# Patient Record
Sex: Female | Born: 1990 | Race: Black or African American | Hispanic: No | Marital: Single | Smoking: Former smoker
Health system: Southern US, Community
[De-identification: ages and names within clinical notes are randomized; demographics above are authoritative.]

## PROBLEM LIST (undated history)

## (undated) ENCOUNTER — Inpatient Hospital Stay (HOSPITAL_COMMUNITY): Payer: Self-pay

## (undated) DIAGNOSIS — S82409A Unspecified fracture of shaft of unspecified fibula, initial encounter for closed fracture: Secondary | ICD-10-CM

## (undated) DIAGNOSIS — O139 Gestational [pregnancy-induced] hypertension without significant proteinuria, unspecified trimester: Secondary | ICD-10-CM

## (undated) DIAGNOSIS — A599 Trichomoniasis, unspecified: Secondary | ICD-10-CM

## (undated) DIAGNOSIS — Z8742 Personal history of other diseases of the female genital tract: Secondary | ICD-10-CM

## (undated) DIAGNOSIS — F909 Attention-deficit hyperactivity disorder, unspecified type: Secondary | ICD-10-CM

## (undated) DIAGNOSIS — G43909 Migraine, unspecified, not intractable, without status migrainosus: Secondary | ICD-10-CM

## (undated) DIAGNOSIS — N390 Urinary tract infection, site not specified: Secondary | ICD-10-CM

## (undated) DIAGNOSIS — R87629 Unspecified abnormal cytological findings in specimens from vagina: Secondary | ICD-10-CM

## (undated) DIAGNOSIS — A539 Syphilis, unspecified: Secondary | ICD-10-CM

## (undated) DIAGNOSIS — K219 Gastro-esophageal reflux disease without esophagitis: Secondary | ICD-10-CM

## (undated) DIAGNOSIS — A749 Chlamydial infection, unspecified: Secondary | ICD-10-CM

## (undated) DIAGNOSIS — F329 Major depressive disorder, single episode, unspecified: Secondary | ICD-10-CM

## (undated) DIAGNOSIS — F32A Depression, unspecified: Secondary | ICD-10-CM

## (undated) DIAGNOSIS — R87619 Unspecified abnormal cytological findings in specimens from cervix uteri: Secondary | ICD-10-CM

## (undated) DIAGNOSIS — IMO0002 Reserved for concepts with insufficient information to code with codable children: Secondary | ICD-10-CM

## (undated) DIAGNOSIS — O24419 Gestational diabetes mellitus in pregnancy, unspecified control: Secondary | ICD-10-CM

## (undated) HISTORY — PX: INDUCED ABORTION: SHX677

## (undated) HISTORY — DX: Gestational (pregnancy-induced) hypertension without significant proteinuria, unspecified trimester: O13.9

## (undated) HISTORY — PX: DILATION AND CURETTAGE OF UTERUS: SHX78

## (undated) HISTORY — PX: WISDOM TOOTH EXTRACTION: SHX21

## (undated) HISTORY — DX: Depression, unspecified: F32.A

## (undated) HISTORY — DX: Gestational diabetes mellitus in pregnancy, unspecified control: O24.419

## (undated) HISTORY — DX: Major depressive disorder, single episode, unspecified: F32.9

---
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
## 2010-01-02 ENCOUNTER — Emergency Department (HOSPITAL_COMMUNITY): Admission: EM | Admit: 2010-01-02 | Discharge: 2010-01-02 | Payer: Self-pay | Admitting: Emergency Medicine

## 2010-02-27 ENCOUNTER — Ambulatory Visit: Payer: Self-pay | Admitting: Nurse Practitioner

## 2010-02-27 ENCOUNTER — Inpatient Hospital Stay (HOSPITAL_COMMUNITY): Admission: AD | Admit: 2010-02-27 | Discharge: 2010-02-27 | Payer: Self-pay | Admitting: Obstetrics

## 2010-06-11 ENCOUNTER — Inpatient Hospital Stay (HOSPITAL_COMMUNITY)
Admission: AD | Admit: 2010-06-11 | Discharge: 2010-06-11 | Disposition: A | Discharge status: Home / Self Care | Payer: Medicaid Other | Source: Ambulatory Visit | Attending: Obstetrics | Admitting: Obstetrics

## 2010-06-11 DIAGNOSIS — A499 Bacterial infection, unspecified: Secondary | ICD-10-CM | POA: Insufficient documentation

## 2010-06-11 DIAGNOSIS — R300 Dysuria: Secondary | ICD-10-CM | POA: Insufficient documentation

## 2010-06-11 DIAGNOSIS — N760 Acute vaginitis: Secondary | ICD-10-CM

## 2010-06-11 DIAGNOSIS — A6000 Herpesviral infection of urogenital system, unspecified: Secondary | ICD-10-CM | POA: Insufficient documentation

## 2010-06-11 DIAGNOSIS — B9689 Other specified bacterial agents as the cause of diseases classified elsewhere: Secondary | ICD-10-CM | POA: Insufficient documentation

## 2010-06-11 LAB — URINALYSIS, ROUTINE W REFLEX MICROSCOPIC
Ketones, ur: NEGATIVE mg/dL
Nitrite: NEGATIVE
Specific Gravity, Urine: >1.03 — ABNORMAL HIGH (ref 1.005–1.030)
pH: 5.5 (ref 5.0–8.0)

## 2010-06-11 LAB — POCT PREGNANCY, URINE: Preg Test, Ur: NEGATIVE

## 2010-06-11 LAB — WET PREP, GENITAL: Yeast Wet Prep HPF POC: NONE SEEN

## 2010-06-12 LAB — GC/CHLAMYDIA PROBE AMP, GENITAL: GC Probe Amp, Genital: NEGATIVE

## 2010-06-14 LAB — HERPES SIMPLEX VIRUS CULTURE

## 2010-07-10 LAB — URINALYSIS, ROUTINE W REFLEX MICROSCOPIC
Bilirubin Urine: NEGATIVE
Glucose, UA: NEGATIVE mg/dL
Ketones, ur: NEGATIVE mg/dL
Leukocytes, UA: NEGATIVE
Protein, ur: NEGATIVE mg/dL

## 2010-07-10 LAB — WET PREP, GENITAL
Trich, Wet Prep: NONE SEEN
Yeast Wet Prep HPF POC: NONE SEEN

## 2010-07-10 LAB — GC/CHLAMYDIA PROBE AMP, GENITAL: GC Probe Amp, Genital: NEGATIVE

## 2010-07-13 LAB — URINALYSIS, ROUTINE W REFLEX MICROSCOPIC
Glucose, UA: NEGATIVE mg/dL
Hgb urine dipstick: NEGATIVE
Ketones, ur: NEGATIVE mg/dL
Protein, ur: NEGATIVE mg/dL

## 2010-07-13 LAB — WET PREP, GENITAL
Clue Cells Wet Prep HPF POC: NONE SEEN
Trich, Wet Prep: NONE SEEN
Yeast Wet Prep HPF POC: NONE SEEN

## 2010-07-15 LAB — URINALYSIS, ROUTINE W REFLEX MICROSCOPIC
Hgb urine dipstick: NEGATIVE
Ketones, ur: NEGATIVE mg/dL
Protein, ur: NEGATIVE mg/dL
Urobilinogen, UA: 0.2 mg/dL (ref 0.0–1.0)

## 2010-07-15 LAB — WET PREP, GENITAL: Trich, Wet Prep: NONE SEEN

## 2010-07-15 LAB — POCT PREGNANCY, URINE: Preg Test, Ur: NEGATIVE

## 2010-09-07 ENCOUNTER — Inpatient Hospital Stay (HOSPITAL_COMMUNITY)
Admission: AD | Admit: 2010-09-07 | Discharge: 2010-09-07 | Disposition: A | Discharge status: Home / Self Care | Payer: Medicaid Other | Source: Ambulatory Visit | Attending: Obstetrics | Admitting: Obstetrics

## 2010-09-07 DIAGNOSIS — N949 Unspecified condition associated with female genital organs and menstrual cycle: Secondary | ICD-10-CM

## 2010-09-07 DIAGNOSIS — B3731 Acute candidiasis of vulva and vagina: Secondary | ICD-10-CM | POA: Insufficient documentation

## 2010-09-07 DIAGNOSIS — B373 Candidiasis of vulva and vagina: Secondary | ICD-10-CM | POA: Insufficient documentation

## 2010-09-07 DIAGNOSIS — A5901 Trichomonal vulvovaginitis: Secondary | ICD-10-CM

## 2010-09-07 LAB — WET PREP, GENITAL: Yeast Wet Prep HPF POC: NONE SEEN

## 2010-09-10 LAB — GC/CHLAMYDIA PROBE AMP, GENITAL: GC Probe Amp, Genital: NEGATIVE

## 2010-09-18 ENCOUNTER — Inpatient Hospital Stay (HOSPITAL_COMMUNITY)
Admission: AD | Admit: 2010-09-18 | Discharge: 2010-09-18 | Disposition: A | Discharge status: Home / Self Care | Payer: Medicaid Other | Source: Ambulatory Visit | Attending: Obstetrics | Admitting: Obstetrics

## 2010-09-18 DIAGNOSIS — A5901 Trichomonal vulvovaginitis: Secondary | ICD-10-CM | POA: Insufficient documentation

## 2010-09-18 DIAGNOSIS — N949 Unspecified condition associated with female genital organs and menstrual cycle: Secondary | ICD-10-CM

## 2010-09-18 LAB — WET PREP, GENITAL: Clue Cells Wet Prep HPF POC: NONE SEEN

## 2010-09-20 LAB — GC/CHLAMYDIA PROBE AMP, GENITAL
Chlamydia, DNA Probe: NEGATIVE
GC Probe Amp, Genital: NEGATIVE

## 2010-10-12 ENCOUNTER — Inpatient Hospital Stay (HOSPITAL_COMMUNITY)
Admission: AD | Admit: 2010-10-12 | Discharge: 2010-10-12 | Discharge status: Against Medical Advice | Payer: Medicaid Other | Source: Ambulatory Visit | Attending: Obstetrics & Gynecology | Admitting: Obstetrics & Gynecology

## 2010-10-12 DIAGNOSIS — R109 Unspecified abdominal pain: Secondary | ICD-10-CM | POA: Insufficient documentation

## 2010-10-12 LAB — URINALYSIS, ROUTINE W REFLEX MICROSCOPIC
Bilirubin Urine: NEGATIVE
Glucose, UA: NEGATIVE mg/dL
Hgb urine dipstick: NEGATIVE
Specific Gravity, Urine: >1.03 — ABNORMAL HIGH (ref 1.005–1.030)
pH: 6 (ref 5.0–8.0)

## 2010-10-12 LAB — POCT PREGNANCY, URINE: Preg Test, Ur: NEGATIVE

## 2010-11-24 ENCOUNTER — Inpatient Hospital Stay (HOSPITAL_COMMUNITY)
Admission: AD | Admit: 2010-11-24 | Discharge: 2010-11-24 | Discharge status: Against Medical Advice | Payer: Medicaid Other | Source: Ambulatory Visit | Attending: Obstetrics & Gynecology | Admitting: Obstetrics & Gynecology

## 2010-11-24 ENCOUNTER — Encounter (HOSPITAL_COMMUNITY): Payer: Self-pay | Admitting: *Deleted

## 2010-11-24 DIAGNOSIS — Z3200 Encounter for pregnancy test, result unknown: Secondary | ICD-10-CM | POA: Insufficient documentation

## 2010-11-24 LAB — URINALYSIS, ROUTINE W REFLEX MICROSCOPIC
Glucose, UA: NEGATIVE mg/dL
Hgb urine dipstick: NEGATIVE
Specific Gravity, Urine: 1.025 (ref 1.005–1.030)
Urobilinogen, UA: 0.2 mg/dL (ref 0.0–1.0)
pH: 7 (ref 5.0–8.0)

## 2010-11-24 LAB — POCT PREGNANCY, URINE: Preg Test, Ur: NEGATIVE

## 2010-11-24 NOTE — Progress Notes (Signed)
Pt reports having lower abd pain on and off for the past month. Pt reports having nausea but no vomiting. Pt reports missing 2 periods

## 2010-11-24 NOTE — ED Notes (Signed)
Pt wanted to sign out AMA. Just wanted pregnancy test and once found out results didn't want to wait. AMA form signed and pt left at [facility]

## 2010-12-21 ENCOUNTER — Emergency Department (HOSPITAL_COMMUNITY)
Admission: EM | Admit: 2010-12-21 | Discharge: 2010-12-21 | Disposition: A | Discharge status: Home / Self Care | Payer: Medicaid Other | Attending: Emergency Medicine | Admitting: Emergency Medicine

## 2010-12-21 DIAGNOSIS — J45909 Unspecified asthma, uncomplicated: Secondary | ICD-10-CM | POA: Insufficient documentation

## 2010-12-21 DIAGNOSIS — N949 Unspecified condition associated with female genital organs and menstrual cycle: Secondary | ICD-10-CM | POA: Insufficient documentation

## 2010-12-21 DIAGNOSIS — B9689 Other specified bacterial agents as the cause of diseases classified elsewhere: Secondary | ICD-10-CM | POA: Insufficient documentation

## 2010-12-21 DIAGNOSIS — N72 Inflammatory disease of cervix uteri: Secondary | ICD-10-CM | POA: Insufficient documentation

## 2010-12-21 DIAGNOSIS — N760 Acute vaginitis: Secondary | ICD-10-CM | POA: Insufficient documentation

## 2010-12-21 DIAGNOSIS — A499 Bacterial infection, unspecified: Secondary | ICD-10-CM | POA: Insufficient documentation

## 2010-12-21 LAB — WET PREP, GENITAL: Trich, Wet Prep: NONE SEEN

## 2010-12-21 LAB — URINALYSIS, ROUTINE W REFLEX MICROSCOPIC
Bilirubin Urine: NEGATIVE
Nitrite: NEGATIVE
Specific Gravity, Urine: 1.022 (ref 1.005–1.030)
Urobilinogen, UA: 1 mg/dL (ref 0.0–1.0)

## 2010-12-21 LAB — URINE MICROSCOPIC-ADD ON

## 2010-12-22 LAB — GC/CHLAMYDIA PROBE AMP, GENITAL
Chlamydia, DNA Probe: NEGATIVE
GC Probe Amp, Genital: NEGATIVE

## 2010-12-24 LAB — POCT PREGNANCY, URINE: Preg Test, Ur: NEGATIVE

## 2011-01-25 LAB — URINALYSIS, ROUTINE W REFLEX MICROSCOPIC
Bilirubin Urine: NEGATIVE
Ketones, ur: NEGATIVE
Nitrite: NEGATIVE
Specific Gravity, Urine: >1.03 — ABNORMAL HIGH
Urobilinogen, UA: 0.2

## 2011-01-25 LAB — POCT PREGNANCY, URINE: Operator id: 27769

## 2011-02-05 DIAGNOSIS — Z0271 Encounter for disability determination: Secondary | ICD-10-CM

## 2011-03-04 ENCOUNTER — Inpatient Hospital Stay (HOSPITAL_COMMUNITY)
Admission: AD | Admit: 2011-03-04 | Discharge: 2011-03-04 | Discharge status: Against Medical Advice | Payer: Medicaid Other | Source: Ambulatory Visit | Attending: Obstetrics & Gynecology | Admitting: Obstetrics & Gynecology

## 2011-03-04 ENCOUNTER — Encounter (HOSPITAL_COMMUNITY): Payer: Self-pay

## 2011-03-04 DIAGNOSIS — R109 Unspecified abdominal pain: Secondary | ICD-10-CM | POA: Insufficient documentation

## 2011-03-04 DIAGNOSIS — Z3202 Encounter for pregnancy test, result negative: Secondary | ICD-10-CM | POA: Insufficient documentation

## 2011-03-04 LAB — URINALYSIS, ROUTINE W REFLEX MICROSCOPIC
Leukocytes, UA: NEGATIVE
Nitrite: NEGATIVE
Specific Gravity, Urine: 1.02 (ref 1.005–1.030)
pH: 7 (ref 5.0–8.0)

## 2011-03-04 NOTE — ED Notes (Signed)
After nurse finished initial gyn assessment, Pt asked if pregnancy test had come back yet. Informed pt that the test was negative. The patient then asked if she could leave. Informed pt that if she left at this point she would need to sign an AMA form. Explained to patient what that meant & she stated she still wanted to leave. Had patient sign elopement form on esignature pad & patient left.

## 2011-03-04 NOTE — Progress Notes (Signed)
Past couple days has been getting sick. Period is late, has not done test.  Has bad odor.  Back started hurting today.

## 2011-03-23 ENCOUNTER — Inpatient Hospital Stay (HOSPITAL_COMMUNITY)
Admission: AD | Admit: 2011-03-23 | Discharge: 2011-03-23 | Disposition: A | Discharge status: Home / Self Care | Payer: Medicaid Other | Source: Ambulatory Visit | Attending: Obstetrics | Admitting: Obstetrics

## 2011-03-23 ENCOUNTER — Encounter (HOSPITAL_COMMUNITY): Payer: Self-pay | Admitting: Obstetrics and Gynecology

## 2011-03-23 ENCOUNTER — Inpatient Hospital Stay (HOSPITAL_COMMUNITY): Payer: Medicaid Other

## 2011-03-23 DIAGNOSIS — R109 Unspecified abdominal pain: Secondary | ICD-10-CM | POA: Insufficient documentation

## 2011-03-23 DIAGNOSIS — O209 Hemorrhage in early pregnancy, unspecified: Secondary | ICD-10-CM | POA: Insufficient documentation

## 2011-03-23 LAB — URINALYSIS, ROUTINE W REFLEX MICROSCOPIC
Leukocytes, UA: NEGATIVE
Protein, ur: NEGATIVE mg/dL
Urobilinogen, UA: 0.2 mg/dL (ref 0.0–1.0)

## 2011-03-23 LAB — CBC
HCT: 40.9 % (ref 36.0–46.0)
MCHC: 33.7 g/dL (ref 30.0–36.0)
RDW: 13.2 % (ref 11.5–15.5)

## 2011-03-23 LAB — HCG, QUANTITATIVE, PREGNANCY: hCG, Beta Chain, Quant, S: 4509 m[IU]/mL — ABNORMAL HIGH (ref <5)

## 2011-03-23 LAB — WET PREP, GENITAL: Yeast Wet Prep HPF POC: NONE SEEN

## 2011-03-23 LAB — POCT PREGNANCY, URINE: Preg Test, Ur: POSITIVE

## 2011-03-23 NOTE — Progress Notes (Signed)
Pt presents to [facility] with complaints of stomach pain, vagina odor, and vaginal spotting that stopped last night. Pt asked me if I can give her an Ultrasound because she is worried about the baby. Pt is G3P0 at approx 8 weeks preg.

## 2011-03-23 NOTE — Progress Notes (Signed)
Onset of lower abdominal pain with spotting yesterday, none today, having odor, found out pregnant on Monday in Dr. Verdell Carmine office, had period in September.

## 2011-03-25 LAB — GC/CHLAMYDIA PROBE AMP, GENITAL: GC Probe Amp, Genital: NEGATIVE

## 2011-04-02 LAB — OB RESULTS CONSOLE ABO/RH: RH Type: POSITIVE

## 2011-04-02 LAB — OB RESULTS CONSOLE HIV ANTIBODY (ROUTINE TESTING): HIV: NONREACTIVE

## 2011-04-02 LAB — OB RESULTS CONSOLE HEPATITIS B SURFACE ANTIGEN: Hepatitis B Surface Ag: NEGATIVE

## 2011-04-19 ENCOUNTER — Encounter (HOSPITAL_COMMUNITY): Payer: Self-pay | Admitting: *Deleted

## 2011-04-19 ENCOUNTER — Inpatient Hospital Stay (HOSPITAL_COMMUNITY)
Admission: AD | Admit: 2011-04-19 | Discharge: 2011-04-19 | Disposition: A | Discharge status: Home / Self Care | Payer: Medicaid Other | Source: Ambulatory Visit | Attending: Obstetrics | Admitting: Obstetrics

## 2011-04-19 DIAGNOSIS — R059 Cough, unspecified: Secondary | ICD-10-CM | POA: Insufficient documentation

## 2011-04-19 DIAGNOSIS — R05 Cough: Secondary | ICD-10-CM | POA: Insufficient documentation

## 2011-04-19 DIAGNOSIS — J069 Acute upper respiratory infection, unspecified: Secondary | ICD-10-CM

## 2011-04-19 DIAGNOSIS — J3489 Other specified disorders of nose and nasal sinuses: Secondary | ICD-10-CM | POA: Insufficient documentation

## 2011-04-19 DIAGNOSIS — O99891 Other specified diseases and conditions complicating pregnancy: Secondary | ICD-10-CM | POA: Insufficient documentation

## 2011-04-19 MED ORDER — GUAIFENESIN 100 MG/5ML PO LIQD: 200.0000 mg | Freq: Three times a day (TID) | ORAL | Status: AC | PRN

## 2011-04-19 NOTE — Progress Notes (Signed)
Onset of congestion for 1 week with dry cough, chest feels heavy, denies chills or fever, feels bad, [redacted] weeks pregnant, does not know what she can take.

## 2011-04-19 NOTE — ED Provider Notes (Signed)
History     Chief Complaint  Patient presents with  . Nasal Congestion  . Cough   Cough The current episode started in the past 7 days. The problem has been unchanged. The cough is non-productive. Associated symptoms include ear pain, a fever (low grade fever.), headaches, nasal congestion and rhinorrhea. Pertinent negatives include no myalgias. She has tried nothing for the symptoms.    OB History    Grav Para Term Preterm Abortions TAB SAB Ect Mult Living   3 0 0 0 2 1 1 0 0 0       Past Medical History  Diagnosis Date  . Asthma     childhood /grew out of it    Past Surgical History  Procedure Date  . Wisdom tooth extraction     Family History  Problem Relation Age of Onset  . Diabetes Mother   . Hypertension Mother   . Diabetes Maternal Grandfather   . Anesthesia problems Neg Hx   . Hypotension Neg Hx   . Malignant hyperthermia Neg Hx   . Pseudochol deficiency Neg Hx     History  Substance Use Topics  . Smoking status: Former Smoker -- 0.0 packs/day  . Smokeless tobacco: Not on file  . Alcohol Use: No    Allergies: No Known Allergies  Prescriptions prior to admission  Medication Sig Dispense Refill  . acetaminophen (TYLENOL) 325 MG tablet Take 650 mg by mouth every 6 (six) hours as needed. For headache or pain       . Prenatal Vit-Fe Fumarate-FA (PRENATAL MULTIVITAMIN) TABS Take 1 tablet by mouth daily.          Review of Systems  Constitutional: Positive for fever (low grade fever.).  HENT: Positive for ear pain, congestion (nasal congestion) and rhinorrhea. Negative for neck pain.   Respiratory: Positive for cough.   Gastrointestinal: Positive for nausea. Negative for vomiting.  Genitourinary: Negative.   Musculoskeletal: Negative for myalgias.  Neurological: Positive for headaches. Negative for dizziness.   Physical Exam   Blood pressure 124/69, pulse 115, temperature 100.1 F (37.8 C), resp. rate 18, height 5' (1.524 m), weight 110.678 kg (244  lb), last menstrual period 01/21/2011.  Physical Exam  Constitutional: She is oriented to [place], place, and time. She appears well-developed and well-nourished. No distress.  HENT:  Head: Normocephalic and atraumatic.  Right Ear: External ear normal.  Left Ear: External ear normal.  Mouth/Throat: Oropharynx is clear and moist.  Eyes: Pupils are equal, round, and reactive to light.  Neck: Normal range of motion.  Cardiovascular: Normal rate and regular rhythm.   Respiratory: Effort normal and breath sounds normal. No respiratory distress.  Musculoskeletal: Normal range of motion.  Neurological: She is alert and oriented to [place], place, and time. She has normal reflexes.  Skin: Skin is warm and dry.  Psychiatric: She has a normal mood and affect. Her behavior is normal. Judgment and thought content normal.    MAU Course  Procedures  Assessment and Plan  Discussed pregnancy safe OTC meds for cough and cold in pregnancy. NOB visit scheduled on Monday. Advised tylenol cold, robitussin, vicks vaporub. Call office answering service for further problems.   HAMBY, REBECCA 04/19/2011, 7:31 PM

## 2011-04-19 NOTE — Progress Notes (Signed)
Becky Hamby CNM in to see pt. Discussed d/c plan and meds pt could take for cold sx. Understanding voiced. Has scheduled appt for Monday in office.

## 2011-04-19 NOTE — ED Provider Notes (Signed)
History     Chief Complaint  Patient presents with  . Nasal Congestion  . Cough   HPI Georgie C Tippin is 20 y.o. G3P0020 [redacted]w[redacted]d weeks presenting with complaint of bilateral ear pain, left more than right.  She has sinus, eye and head pain that began last night.  She did not call the office today to report sxs.  Was unaware of fever.  Vomiting X 1 today.  Has dry cough.  Has begun prenatal care.    Past Medical History  Diagnosis Date  . Asthma     childhood /grew out of it    Past Surgical History  Procedure Date  . Wisdom tooth extraction     Family History  Problem Relation Age of Onset  . Diabetes Mother   . Hypertension Mother   . Diabetes Maternal Grandfather   . Anesthesia problems Neg Hx   . Hypotension Neg Hx   . Malignant hyperthermia Neg Hx   . Pseudochol deficiency Neg Hx     History  Substance Use Topics  . Smoking status: Former Smoker -- 0.0 packs/day  . Smokeless tobacco: Not on file  . Alcohol Use: No    Allergies: No Known Allergies  Prescriptions prior to admission  Medication Sig Dispense Refill  . acetaminophen (TYLENOL) 325 MG tablet Take 650 mg by mouth every 6 (six) hours as needed. For headache or pain       . Prenatal Vit-Fe Fumarate-FA (PRENATAL MULTIVITAMIN) TABS Take 1 tablet by mouth daily.          Review of Systems  Constitutional: Positive for fever. Negative for chills.  HENT: Positive for ear pain. Negative for sore throat.   Eyes: Positive for pain.  Respiratory: Positive for cough (dry).   Gastrointestinal: Positive for vomiting (x1). Negative for nausea.  Genitourinary: Negative.   Neurological: Positive for headaches.   Physical Exam   Blood pressure 124/69, pulse 115, temperature 100.1 F (37.8 C), resp. rate 18, height 5' (1.524 m), weight 244 lb (110.678 kg), last menstrual period 01/21/2011.  Physical Exam  Constitutional: She is oriented to [place], place, and time. She appears well-developed. She has a sickly  appearance.  HENT:  Head: Normocephalic.  Right Ear: External ear normal.  Left Ear: External ear normal.  Mouth/Throat: Oropharynx is clear and moist.       Tenderness over sinuses.  Cardiovascular: Normal rate.   Respiratory: Effort normal.  Lymphadenopathy:    She has no cervical adenopathy.  Neurological: She is alert and oriented to [place], place, and time.  Skin: Skin is warm.    MAU Course  Procedures  MDM 19:20  After MSE, started to call Dr. Clearance Coots and noticed CNM on call, called Anice Paganini, CNM, reported sxs and physical findings.   She will come in to see patient.   Assessment and Plan   KEY,EVE M 04/19/2011, 7:07 PM   Matt Holmes, NP 04/19/11 [phone]

## 2011-04-19 NOTE — ED Notes (Signed)
Eve Key NP in to see pt

## 2011-04-19 NOTE — ED Notes (Signed)
Eve Key RNP in wi th pt

## 2011-04-30 NOTE — L&D Delivery Note (Signed)
Delivery Note At 7:30 PM a viable female was delivered via Vaginal, Spontaneous Delivery (Presentation: ; Right Occiput Anterior). The posterior arm was delivered after an attempt was made to deliver the anterior shoulder.   APGAR: 6, 9 .   Placenta status: Intact, Spontaneous with cord traction.  Cord: 3 vessels with the following complications: None.    Anesthesia: Epidural  Episiotomy: None Lacerations: 2nd degree;Vaginal Suture Repair: 2.0 3.0 vicryl rapide Est. Blood Loss (mL): 300 ml  Mom to postpartum.  Baby to nursery-stable.  JACKSON-MOORE,Hadlie Gipson A 11/27/2011, 7:59 PM

## 2011-05-29 ENCOUNTER — Encounter (HOSPITAL_COMMUNITY): Payer: Self-pay | Admitting: Neurology

## 2011-05-29 ENCOUNTER — Emergency Department (HOSPITAL_COMMUNITY)
Admission: EM | Admit: 2011-05-29 | Discharge: 2011-05-29 | Disposition: A | Discharge status: Home / Self Care | Payer: Medicaid Other | Attending: Emergency Medicine | Admitting: Emergency Medicine

## 2011-05-29 DIAGNOSIS — H1010 Acute atopic conjunctivitis, unspecified eye: Secondary | ICD-10-CM

## 2011-05-29 DIAGNOSIS — T1590XA Foreign body on external eye, part unspecified, unspecified eye, initial encounter: Secondary | ICD-10-CM | POA: Insufficient documentation

## 2011-05-29 DIAGNOSIS — H5710 Ocular pain, unspecified eye: Secondary | ICD-10-CM | POA: Insufficient documentation

## 2011-05-29 DIAGNOSIS — H5789 Other specified disorders of eye and adnexa: Secondary | ICD-10-CM | POA: Insufficient documentation

## 2011-05-29 DIAGNOSIS — H1045 Other chronic allergic conjunctivitis: Secondary | ICD-10-CM | POA: Insufficient documentation

## 2011-05-29 DIAGNOSIS — J3489 Other specified disorders of nose and nasal sinuses: Secondary | ICD-10-CM | POA: Insufficient documentation

## 2011-05-29 DIAGNOSIS — H11419 Vascular abnormalities of conjunctiva, unspecified eye: Secondary | ICD-10-CM | POA: Insufficient documentation

## 2011-05-29 DIAGNOSIS — J45909 Unspecified asthma, uncomplicated: Secondary | ICD-10-CM | POA: Insufficient documentation

## 2011-05-29 DIAGNOSIS — R6889 Other general symptoms and signs: Secondary | ICD-10-CM | POA: Insufficient documentation

## 2011-05-29 DIAGNOSIS — O9989 Other specified diseases and conditions complicating pregnancy, childbirth and the puerperium: Secondary | ICD-10-CM | POA: Insufficient documentation

## 2011-05-29 DIAGNOSIS — H579 Unspecified disorder of eye and adnexa: Secondary | ICD-10-CM | POA: Insufficient documentation

## 2011-05-29 MED ORDER — FLUORESCEIN SODIUM 1 MG OP STRP
1.0000 | ORAL_STRIP | Freq: Once | OPHTHALMIC | Status: AC
  Administered 2011-05-29: 09:00:00 via OPHTHALMIC

## 2011-05-29 MED ORDER — TETRACAINE HCL 0.5 % OP SOLN
OPHTHALMIC | Status: AC
  Filled 2011-05-29: qty 2

## 2011-05-29 MED ORDER — FLUORESCEIN SODIUM 1 MG OP STRP
ORAL_STRIP | OPHTHALMIC | Status: AC
  Filled 2011-05-29: qty 1

## 2011-05-29 MED ORDER — LORATADINE 10 MG PO TABS: 10.0000 mg | ORAL_TABLET | Freq: Every day | ORAL | Status: DC

## 2011-05-29 MED ORDER — TETRACAINE HCL 0.5 % OP SOLN
1.0000 [drp] | Freq: Once | OPHTHALMIC | Status: AC
  Administered 2011-05-29: 09:00:00 via OPHTHALMIC

## 2011-05-29 NOTE — ED Notes (Signed)
Pt reporting pregnant; c/o right eye swelling. Denying any drainage. Reporting scratch to right eye last night. Pupils equal and reactive, 3+. Alert and oriented. NAD

## 2011-05-29 NOTE — ED Provider Notes (Signed)
History     CSN: [id number]  Arrival date & time 05/29/11  [id number]   First MD Initiated Contact with Patient 05/29/11 [phone]      Chief Complaint  Patient presents with  . Eye Pain    (Consider location/radiation/quality/duration/timing/severity/associated sxs/prior treatment) HPI Comments: Patient states she noted some eye itching yesterday. Scratched her last evening and awoke today with some right periorbital swelling. She has no pain, discharge, or visual changes. States her vision is normal. No headache. She has associated congestion.  Patient is a 21 y.o. female presenting with eye pain. The history is provided by the patient. No language interpreter was used.  Eye Pain This is a new problem. The current episode started yesterday. The problem occurs constantly. The problem has been gradually worsening. Pertinent negatives include no chest pain, no abdominal pain, no headaches and no shortness of breath. The symptoms are aggravated by nothing. The symptoms are relieved by nothing. She has tried nothing for the symptoms.    Past Medical History  Diagnosis Date  . Asthma     childhood /grew out of it    Past Surgical History  Procedure Date  . Wisdom tooth extraction     Family History  Problem Relation Age of Onset  . Diabetes Mother   . Hypertension Mother   . Diabetes Maternal Grandfather   . Anesthesia problems Neg Hx   . Hypotension Neg Hx   . Malignant hyperthermia Neg Hx   . Pseudochol deficiency Neg Hx     History  Substance Use Topics  . Smoking status: Former Smoker -- 0.0 packs/day  . Smokeless tobacco: Not on file  . Alcohol Use: No    OB History    Grav Para Term Preterm Abortions TAB SAB Ect Mult Living   3 0 0 0 2 1 1 0 0 0       Review of Systems  Constitutional: Negative for fever, activity change, appetite change and fatigue.  HENT: Positive for congestion and sneezing. Negative for sore throat, rhinorrhea, neck pain and neck stiffness.     Eyes: Positive for pain.  Respiratory: Negative for cough and shortness of breath.   Cardiovascular: Negative for chest pain and palpitations.  Gastrointestinal: Negative for nausea, vomiting and abdominal pain.  Genitourinary: Negative for dysuria, urgency, frequency and flank pain.  Neurological: Negative for dizziness, weakness, light-headedness, numbness and headaches.  All other systems reviewed and are negative.    Allergies  Fruit & vegetable  Home Medications   Current Outpatient Rx  Name Route Sig Dispense Refill  . ACETAMINOPHEN 325 MG PO TABS Oral Take 650 mg by mouth every 6 (six) hours as needed. For headache or pain     . PRENATAL MULTIVITAMIN CH Oral Take 1 tablet by mouth daily.      . TETRAHYDROZ-POLYVINYL AL-POVID 0.05-0.5-0.6 % OP SOLN Right Eye Place 1-2 drops into the right eye once. Swollen eye    . LORATADINE 10 MG PO TABS Oral Take 1 tablet (10 mg total) by mouth daily. 14 tablet 0    BP 129/83  Pulse 105  Temp(Src) 98 F (36.7 C) (Oral)  Resp 18  SpO2 98%  LMP 01/21/2011  Physical Exam  Nursing note and vitals reviewed. Constitutional: She is oriented to [place], place, and time. She appears well-developed and well-nourished. No distress.  HENT:  Head: Normocephalic and atraumatic.  Mouth/Throat: Oropharynx is clear and moist. No oropharyngeal exudate.  Eyes: Conjunctivae and EOM are normal. Pupils are equal, round,  and reactive to light.       Mild periorbital swelling to r eye with some scleral injection.  No fluoroscein uptake  Neck: Normal range of motion. Neck supple.  Cardiovascular: Normal rate, regular rhythm, normal heart sounds and intact distal pulses.  Exam reveals no gallop and no friction rub.   No murmur heard. Pulmonary/Chest: Effort normal and breath sounds normal. No respiratory distress.  Abdominal: Soft. There is no tenderness.  Musculoskeletal: Normal range of motion. She exhibits no tenderness.  Neurological: She is alert  and oriented to [place], place, and time. No cranial nerve deficit.    ED Course  Procedures (including critical care time)  Labs Reviewed - No data to display No results found.   1. Allergic conjunctivitis   2. Foreign body, eye       MDM  An eyelash was removed from the patient's right eye using a cotton swab. There is no evidence of corneal abrasion. Patient is 3 months pregnant. She'll be discharged home on Claritin which is safe in pregnancy. This will treat her allergic conjunctivitis. There is no evidence of periorbital cellulitis.  Mild tachycardia on arrival likely secondary to her pregnant state.  She has no abd pain or vaginal sx        Dayton Bailiff, MD 05/29/11 [phone]

## 2011-05-29 NOTE — ED Notes (Signed)
Pt reports last night scratched eye with finger, been having eye pain and swelling since last night. Denying any drainage or visual changes  Pt alert and oriented. NAD

## 2011-05-29 NOTE — ED Notes (Signed)
EDP in to evaluate patient

## 2011-06-17 ENCOUNTER — Inpatient Hospital Stay (HOSPITAL_COMMUNITY)
Admission: AD | Admit: 2011-06-17 | Discharge: 2011-06-17 | Disposition: A | Discharge status: Home / Self Care | Payer: Medicaid Other | Source: Ambulatory Visit | Attending: Obstetrics & Gynecology | Admitting: Obstetrics & Gynecology

## 2011-06-17 ENCOUNTER — Encounter (HOSPITAL_COMMUNITY): Payer: Self-pay | Admitting: *Deleted

## 2011-06-17 DIAGNOSIS — O2390 Unspecified genitourinary tract infection in pregnancy, unspecified trimester: Secondary | ICD-10-CM | POA: Insufficient documentation

## 2011-06-17 DIAGNOSIS — B9689 Other specified bacterial agents as the cause of diseases classified elsewhere: Secondary | ICD-10-CM | POA: Insufficient documentation

## 2011-06-17 DIAGNOSIS — N760 Acute vaginitis: Secondary | ICD-10-CM | POA: Insufficient documentation

## 2011-06-17 DIAGNOSIS — A499 Bacterial infection, unspecified: Secondary | ICD-10-CM | POA: Insufficient documentation

## 2011-06-17 DIAGNOSIS — R109 Unspecified abdominal pain: Secondary | ICD-10-CM | POA: Insufficient documentation

## 2011-06-17 HISTORY — DX: Attention-deficit hyperactivity disorder, unspecified type: F90.9

## 2011-06-17 LAB — URINALYSIS, ROUTINE W REFLEX MICROSCOPIC
Bilirubin Urine: NEGATIVE
Nitrite: NEGATIVE
Protein, ur: NEGATIVE mg/dL
Specific Gravity, Urine: >1.03 — ABNORMAL HIGH (ref 1.005–1.030)
Urobilinogen, UA: 0.2 mg/dL (ref 0.0–1.0)

## 2011-06-17 LAB — WET PREP, GENITAL: Trich, Wet Prep: NONE SEEN

## 2011-06-17 MED ORDER — METRONIDAZOLE 500 MG PO TABS: 500.0000 mg | ORAL_TABLET | Freq: Two times a day (BID) | ORAL | Status: AC

## 2011-06-17 NOTE — Progress Notes (Signed)
Pt presents with complaint of "real bad abd pain" x 1 hour. Pain is sharp and at the bottom of her stomach. Denies dysuria. Denies vaginal discharge. G2 P0.

## 2011-06-17 NOTE — ED Provider Notes (Signed)
Teresa [name] y.o.G3P0020 @[redacted]w[redacted]d  Chief Complaint  Patient presents with  . Abdominal Pain    SUBJECTIVE  HPI: Pt reports intermittent sharp abdominal pain 4-5 times in last hour.  She reports feeling some fetal movement, denies LOF, vaginal bleeding, dysuria, vaginal discharge/itching, h/a, dizziness, or n/v.  Past Medical History  Diagnosis Date  . Asthma     childhood /grew out of it  . ADHD (attention deficit hyperactivity disorder)    Past Surgical History  Procedure Date  . Wisdom tooth extraction    History   Social History  . Marital Status: Single    Spouse Name: N/A    Number of Children: N/A  . Years of Education: N/A   Occupational History  . Not on file.   Social History Main Topics  . Smoking status: Former Smoker -- 0.0 packs/day  . Smokeless tobacco: Not on file  . Alcohol Use: No  . Drug Use: No  . Sexually Active: Yes    Birth Control/ Protection: None   Other Topics Concern  . Not on file   Social History Narrative  . No narrative on file   No current facility-administered medications on file prior to encounter.   Current Outpatient Prescriptions on File Prior to Encounter  Medication Sig Dispense Refill  . acetaminophen (TYLENOL) 325 MG tablet Take 650 mg by mouth every 6 (six) hours as needed. For headache or pain       . loratadine (CLARITIN) 10 MG tablet Take 1 tablet (10 mg total) by mouth daily.  14 tablet  0  . Prenatal Vit-Fe Fumarate-FA (PRENATAL MULTIVITAMIN) TABS Take 1 tablet by mouth daily.        . Tetrahydroz-Polyvinyl Al-Povid (MURINE TEARS PLUS) 0.05-0.5-0.6 % SOLN Place 1-2 drops into the right eye once. Swollen eye       Allergies  Allergen Reactions  . Fruit & Vegetable     Grapes, strawberries, bananas, pine apple     ROS: Pertinent items in HPI  OBJECTIVE Blood pressure 123/63, pulse 96, temperature 98.4 F (36.9 C), temperature source Oral, resp. rate 18, height 5\' 1"  (1.549 m), weight 110.678 kg (244 lb),  last menstrual period 01/21/2011. GENERAL: Well-developed, well-nourished female in no acute distress.  LUNGS: Clear to auscultation bilaterally.  HEART: Regular rate and rhythm. ABDOMEN: Soft, nontender EXTREMITIES: Nontender, no edema EXTERNAL GENITALIA: normal VAGINA: physiologic discharge CERVIX: long/closed/high  FHR: 155 by doppler   RESULTS Results for orders placed during the hospital encounter of 06/17/11 (from the past 24 hour(s))  URINALYSIS, ROUTINE W REFLEX MICROSCOPIC     Status: Abnormal   Collection Time   06/17/11  9:45 PM      Component Value Range   Color, Urine YELLOW  YELLOW    APPearance CLEAR  CLEAR    Specific Gravity, Urine >1.030 (*) 1.005 - 1.030    pH 6.0  5.0 - 8.0    Glucose, UA NEGATIVE  NEGATIVE (mg/dL)   Hgb urine dipstick NEGATIVE  NEGATIVE    Bilirubin Urine NEGATIVE  NEGATIVE    Ketones, ur NEGATIVE  NEGATIVE (mg/dL)   Protein, ur NEGATIVE  NEGATIVE (mg/dL)   Urobilinogen, UA 0.2  0.0 - 1.0 (mg/dL)   Nitrite NEGATIVE  NEGATIVE    Leukocytes, UA NEGATIVE  NEGATIVE   WET PREP, GENITAL     Status: Abnormal   Collection Time   06/17/11 10:36 PM      Component Value Range   Yeast Wet Prep HPF POC NONE  SEEN  NONE SEEN    Trich, Wet Prep NONE SEEN  NONE SEEN    Clue Cells Wet Prep HPF POC FEW (*) NONE SEEN    WBC, Wet Prep HPF POC FEW (*) NONE SEEN     IMAGING Not indicated  ASSESSMENT Bacterial vaginosis Reactive NST  PLAN D/C home Flagyl 500 mg BID for 7 days Encourage increase in PO fluids F/U with Dr Gaynell Face for prenatal care

## 2011-06-17 NOTE — Discharge Instructions (Signed)
Bacterial Vaginosis Bacterial vaginosis (BV) is a vaginal infection where the normal balance of bacteria in the vagina is disrupted. The normal balance is then replaced by an overgrowth of certain bacteria. There are several different kinds of bacteria that can cause BV. BV is the most common vaginal infection in women of childbearing age. CAUSES   The cause of BV is not fully understood. BV develops when there is an increase or imbalance of harmful bacteria.   Some activities or behaviors can upset the normal balance of bacteria in the vagina and put women at increased risk including:   Having a new sex partner or multiple sex partners.   Douching.   Using an intrauterine device (IUD) for contraception.   It is not clear what role sexual activity plays in the development of BV. However, women that have never had sexual intercourse are rarely infected with BV.  Women do not get BV from toilet seats, bedding, swimming pools or from touching objects around them.  SYMPTOMS   Grey vaginal discharge.   A fish-like odor with discharge, especially after sexual intercourse.   Itching or burning of the vagina and vulva.   Burning or pain with urination.   Some women have no signs or symptoms at all.  DIAGNOSIS  Your caregiver must examine the vagina for signs of BV. Your caregiver will perform lab tests and look at the sample of vaginal fluid through a microscope. They will look for bacteria and abnormal cells (clue cells), a pH test higher than 4.5, and a positive amine test all associated with BV.  RISKS AND COMPLICATIONS   Pelvic inflammatory disease (PID).   Infections following gynecology surgery.   Developing HIV.   Developing herpes virus.  TREATMENT  Sometimes BV will clear up without treatment. However, all women with symptoms of BV should be treated to avoid complications, especially if gynecology surgery is planned. Female partners generally do not need to be treated. However,  BV may spread between female sex partners so treatment is helpful in preventing a recurrence of BV.   BV may be treated with antibiotics. The antibiotics come in either pill or vaginal cream forms. Either can be used with nonpregnant or pregnant women, but the recommended dosages differ. These antibiotics are not harmful to the baby.   BV can recur after treatment. If this happens, a second round of antibiotics will often be prescribed.   Treatment is important for pregnant women. If not treated, BV can cause a premature delivery, especially for a pregnant woman who had a premature birth in the past. All pregnant women who have symptoms of BV should be checked and treated.   For chronic reoccurrence of BV, treatment with a type of prescribed gel vaginally twice a week is helpful.  HOME CARE INSTRUCTIONS   Finish all medication as directed by your caregiver.   Do not have sex until treatment is completed.   Tell your sexual partner that you have a vaginal infection. They should see their caregiver and be treated if they have problems, such as a mild rash or itching.   Practice safe sex. Use condoms. Only have 1 sex partner.  PREVENTION  Basic prevention steps can help reduce the risk of upsetting the natural balance of bacteria in the vagina and developing BV:  Do not have sexual intercourse (be abstinent).   Do not douche.   Use all of the medicine prescribed for treatment of BV, even if the signs and symptoms go away.     Tell your sex partner if you have BV. That way, they can be treated, if needed, to prevent reoccurrence.  SEEK MEDICAL CARE IF:   Your symptoms are not improving after 3 days of treatment.   You have increased discharge, pain, or fever.  MAKE SURE YOU:   Understand these instructions.   Will watch your condition.   Will get help right away if you are not doing well or get worse.  FOR MORE INFORMATION  Division of STD Prevention (DSTDP), Centers for Disease  Control and Prevention: www.cdc.gov/std American Social Health Association (ASHA): www.ashastd.org  Document Released: 04/15/2005 Document Revised: 12/26/2010 Document Reviewed: 10/06/2008 Exit[employer] Patient Information 2012 [employer]. 

## 2011-06-18 LAB — GC/CHLAMYDIA PROBE AMP, GENITAL: Chlamydia, DNA Probe: NEGATIVE

## 2011-08-09 ENCOUNTER — Other Ambulatory Visit: Payer: Self-pay | Admitting: Obstetrics

## 2011-08-09 DIAGNOSIS — Z1389 Encounter for screening for other disorder: Secondary | ICD-10-CM

## 2011-08-10 ENCOUNTER — Inpatient Hospital Stay (HOSPITAL_COMMUNITY)
Admission: AD | Admit: 2011-08-10 | Discharge: 2011-08-10 | Disposition: A | Discharge status: Home / Self Care | Payer: Self-pay | Source: Ambulatory Visit | Attending: Obstetrics & Gynecology | Admitting: Obstetrics & Gynecology

## 2011-08-10 ENCOUNTER — Encounter (HOSPITAL_COMMUNITY): Payer: Self-pay | Admitting: *Deleted

## 2011-08-10 DIAGNOSIS — O368190 Decreased fetal movements, unspecified trimester, not applicable or unspecified: Secondary | ICD-10-CM | POA: Insufficient documentation

## 2011-08-10 HISTORY — DX: Urinary tract infection, site not specified: N39.0

## 2011-08-10 NOTE — MAU Note (Signed)
No movement since left doctor yesterday.  No pain or other problems

## 2011-08-10 NOTE — MAU Note (Signed)
+  FM noted by RN when first got heart tones.

## 2011-08-10 NOTE — MAU Provider Note (Signed)
  History     CSN: [id number]  Arrival date and time: 08/10/11 1603   None     No chief complaint on file.  HPI 21 y.o. G3P0020 at [redacted]w[redacted]d. Reports decreased fetal movement, hasn't felt movement since yesterday after noon. Now feeling movement. No pain or bleeding.    Past Medical History  Diagnosis Date  . Asthma     childhood /grew out of it  . ADHD (attention deficit hyperactivity disorder)     Past Surgical History  Procedure Date  . Wisdom tooth extraction     Family History  Problem Relation Age of Onset  . Diabetes Mother   . Hypertension Mother   . Diabetes Maternal Grandfather   . Anesthesia problems Neg Hx   . Hypotension Neg Hx   . Malignant hyperthermia Neg Hx   . Pseudochol deficiency Neg Hx     History  Substance Use Topics  . Smoking status: Former Smoker -- 0.0 packs/day  . Smokeless tobacco: Not on file  . Alcohol Use: No    Allergies:  Allergies  Allergen Reactions  . Fruit & Vegetable Itching and Swelling    Grapes, strawberries, bananas, pine apple     Prescriptions prior to admission  Medication Sig Dispense Refill  . acetaminophen (TYLENOL) 325 MG tablet Take 650 mg by mouth every 6 (six) hours as needed. For headache or pain       . loratadine (CLARITIN) 10 MG tablet Take 1 tablet (10 mg total) by mouth daily.  14 tablet  0  . Prenatal Vit-Fe Fumarate-FA (PRENATAL MULTIVITAMIN) TABS Take 1 tablet by mouth daily.          Review of Systems  Constitutional: Negative.   Respiratory: Negative.   Cardiovascular: Negative.   Gastrointestinal: Negative for nausea, vomiting, abdominal pain, diarrhea and constipation.  Genitourinary: Negative for dysuria, urgency, frequency, hematuria and flank pain.       Negative for vaginal bleeding, vaginal discharge, contractions   Musculoskeletal: Negative.   Neurological: Negative.   Psychiatric/Behavioral: Negative.    Physical Exam   Last menstrual period 01/21/2011.  Physical Exam    Nursing note and vitals reviewed. Constitutional: She is oriented to [place], place, and time. She appears well-developed and well-nourished. No distress.  Cardiovascular: Normal rate.   Respiratory: Effort normal.  GI: Soft. There is no tenderness.  Musculoskeletal: Normal range of motion.  Neurological: She is alert and oriented to [place], place, and time.  Skin: Skin is warm and dry.  Psychiatric: She has a normal mood and affect.   EFM: 145, moderate variability, + accels, reassuring at 25 weeks TOCO: quiet  MAU Course  Procedures    Assessment and Plan  21 y.o. G3P0020 at [redacted]w[redacted]d Decreased fetal movement - baby moving now with reassuring FHR tracing Rev'd precautions, fetal kick counts F/U as scheduled Leeanna Slaby 08/10/2011, 4:05 PM

## 2011-08-13 ENCOUNTER — Ambulatory Visit (HOSPITAL_COMMUNITY)
Admission: RE | Admit: 2011-08-13 | Discharge: 2011-08-13 | Disposition: A | Discharge status: Home / Self Care | Payer: Self-pay | Source: Ambulatory Visit | Attending: Obstetrics | Admitting: Obstetrics

## 2011-08-13 DIAGNOSIS — Z3689 Encounter for other specified antenatal screening: Secondary | ICD-10-CM | POA: Insufficient documentation

## 2011-08-13 DIAGNOSIS — O99210 Obesity complicating pregnancy, unspecified trimester: Secondary | ICD-10-CM | POA: Insufficient documentation

## 2011-08-13 DIAGNOSIS — E669 Obesity, unspecified: Secondary | ICD-10-CM | POA: Insufficient documentation

## 2011-08-13 DIAGNOSIS — Z1389 Encounter for screening for other disorder: Secondary | ICD-10-CM

## 2011-08-22 ENCOUNTER — Encounter (HOSPITAL_COMMUNITY): Payer: Self-pay | Admitting: *Deleted

## 2011-08-22 ENCOUNTER — Inpatient Hospital Stay (HOSPITAL_COMMUNITY)
Admission: AD | Admit: 2011-08-22 | Discharge: 2011-08-22 | Disposition: A | Discharge status: Home / Self Care | Payer: Self-pay | Source: Ambulatory Visit | Attending: Obstetrics & Gynecology | Admitting: Obstetrics & Gynecology

## 2011-08-22 DIAGNOSIS — O99891 Other specified diseases and conditions complicating pregnancy: Secondary | ICD-10-CM | POA: Insufficient documentation

## 2011-08-22 DIAGNOSIS — R300 Dysuria: Secondary | ICD-10-CM | POA: Insufficient documentation

## 2011-08-22 DIAGNOSIS — R51 Headache: Secondary | ICD-10-CM | POA: Insufficient documentation

## 2011-08-22 LAB — URINALYSIS, ROUTINE W REFLEX MICROSCOPIC
Bilirubin Urine: NEGATIVE
Leukocytes, UA: NEGATIVE
Nitrite: NEGATIVE
Specific Gravity, Urine: 1.02 (ref 1.005–1.030)
Urobilinogen, UA: 0.2 mg/dL (ref 0.0–1.0)
pH: 6 (ref 5.0–8.0)

## 2011-08-22 MED ORDER — OXYCODONE-ACETAMINOPHEN 5-325 MG PO TABS
2.0000 | ORAL_TABLET | Freq: Once | ORAL | Status: AC
  Administered 2011-08-22: 2 via ORAL
  Filled 2011-08-22: qty 2

## 2011-08-22 MED ORDER — BUTALBITAL-APAP-CAFFEINE 50-325-40 MG PO TABS: 1.0000 | ORAL_TABLET | Freq: Four times a day (QID) | ORAL | Status: DC | PRN

## 2011-08-22 MED ORDER — TRAMADOL HCL 50 MG PO TABS: 50.0000 mg | ORAL_TABLET | Freq: Four times a day (QID) | ORAL | Status: DC | PRN

## 2011-08-22 NOTE — MAU Note (Signed)
Pt states her 'head hurts and when she goes to the bathroom the void she has a milky discharge

## 2011-08-22 NOTE — Discharge Instructions (Signed)

## 2011-08-22 NOTE — MAU Note (Signed)
HA for 2 days, tylenol not working.  States eyes are cloudy.  C/O pain with urination x 1 week.  Denies bleeding, LOF, or uc's.

## 2011-08-22 NOTE — MAU Provider Note (Signed)
  History     CSN: [id number]  Arrival date and time: 08/22/11 1411   First Provider Initiated Contact with Patient 08/22/11 1612      Chief Complaint  Patient presents with  . Headache   HPI This is a 21 y.o. female at [redacted]w[redacted]d presents with c/o headache unrelieved by Tylenol. Denies history of Migraines, headaches started when she got pregnant.   ALso c/o pain with urination. States it feels like a UTI.   Sees [facility] for [facility] but states was told to come here because her Medicaid ran out.   OB History    Grav Para Term Preterm Abortions TAB SAB Ect Mult Living   3 0 0 0 2 1 1 0 0 0       Past Medical History  Diagnosis Date  . Asthma     childhood /grew out of it  . ADHD (attention deficit hyperactivity disorder)   . Urinary tract infection     Past Surgical History  Procedure Date  . Wisdom tooth extraction   . Induced abortion     Family History  Problem Relation Age of Onset  . Diabetes Mother   . Hypertension Mother   . Diabetes Maternal Grandfather   . Anesthesia problems Neg Hx   . Hypotension Neg Hx   . Malignant hyperthermia Neg Hx   . Pseudochol deficiency Neg Hx     History  Substance Use Topics  . Smoking status: Former Smoker -- 0.0 packs/day  . Smokeless tobacco: Not on file  . Alcohol Use: No    Allergies:  Allergies  Allergen Reactions  . Fruit & Vegetable Shortness Of Breath, Itching and Swelling    Grapes, strawberries, bananas, pine apple, oranges    Prescriptions prior to admission  Medication Sig Dispense Refill  . acetaminophen (TYLENOL) 325 MG tablet Take 650 mg by mouth every 6 (six) hours as needed. For headache or pain      . loratadine (CLARITIN) 10 MG tablet Take 1 tablet (10 mg total) by mouth daily.  14 tablet  0  . Prenatal Vit-Fe Fumarate-FA (PRENATAL MULTIVITAMIN) TABS Take 1 tablet by mouth daily.          ROS As listed in HPI  Physical Exam   Blood pressure 112/70, pulse 97, temperature 97.7 F (36.5 C),  temperature source Oral, resp. rate 20, height 5\' 1"  (1.549 m), weight 261 lb 9.6 oz (118.661 kg), last menstrual period 01/21/2011.  Physical Exam  Constitutional: She is oriented to [place], place, and time. She appears well-developed and well-nourished. No distress.  HENT:  Head: Normocephalic.  Cardiovascular: Normal rate.   Respiratory: Effort normal.  GI: Soft. There is no tenderness.  Musculoskeletal: Normal range of motion.  Neurological: She is alert and oriented to [place], place, and time.       No focal neurologic signs  Skin: Skin is warm and dry.  Psychiatric: She has a normal mood and affect.    MAU Course  Procedures  MDM Given Percocet with some relief. Wants to go home  Assessment and Plan  A:  SIUP at [redacted]w[redacted]d      Headache      Normotensive P:  Discharge home      Rx Fioricet      Follow up at office  [facility] 08/22/2011, 4:20 PM

## 2011-09-20 ENCOUNTER — Inpatient Hospital Stay (HOSPITAL_COMMUNITY)
Admission: AD | Admit: 2011-09-20 | Discharge: 2011-09-20 | Disposition: A | Discharge status: Home / Self Care | Payer: Self-pay | Source: Ambulatory Visit | Attending: Obstetrics & Gynecology | Admitting: Obstetrics & Gynecology

## 2011-09-20 ENCOUNTER — Encounter (HOSPITAL_COMMUNITY): Payer: Self-pay | Admitting: *Deleted

## 2011-09-20 DIAGNOSIS — R109 Unspecified abdominal pain: Secondary | ICD-10-CM | POA: Insufficient documentation

## 2011-09-20 DIAGNOSIS — O99891 Other specified diseases and conditions complicating pregnancy: Secondary | ICD-10-CM | POA: Insufficient documentation

## 2011-09-20 DIAGNOSIS — O479 False labor, unspecified: Secondary | ICD-10-CM

## 2011-09-20 LAB — URINALYSIS, ROUTINE W REFLEX MICROSCOPIC
Hgb urine dipstick: NEGATIVE
Leukocytes, UA: NEGATIVE
Nitrite: NEGATIVE
Protein, ur: NEGATIVE mg/dL
Specific Gravity, Urine: 1.015 (ref 1.005–1.030)
Urobilinogen, UA: 0.2 mg/dL (ref 0.0–1.0)

## 2011-09-20 NOTE — MAU Note (Signed)
Pt states, " I was cooking dinner and washing dishes and I had a big cramp in my abdomen and in my back. I lasted ten- fifteen min and came about three minutes apart. It went away on its on and now I'm not having any pain.

## 2011-09-20 NOTE — Discharge Instructions (Signed)

## 2011-09-20 NOTE — MAU Provider Note (Signed)
  History     CSN: [id number]  Arrival date and time: 09/20/11 1638   First Provider Initiated Contact with Patient 09/20/11 1717      Chief Complaint  Patient presents with  . Abdominal Cramping  . Back Pain   HPI  Pt arrived via EMS reporting contractions that started at 1500.  Denies vaginal bleeding or leaking of fluid.  No reports of recent intercourse.  Pt states contractions have stopped since arrival to hospital.    Past Medical History  Diagnosis Date  . Asthma     childhood /grew out of it  . ADHD (attention deficit hyperactivity disorder)   . Urinary tract infection     Past Surgical History  Procedure Date  . Wisdom tooth extraction   . Induced abortion     Family History  Problem Relation Age of Onset  . Diabetes Mother   . Hypertension Mother   . Diabetes Maternal Grandfather   . Anesthesia problems Neg Hx   . Hypotension Neg Hx   . Malignant hyperthermia Neg Hx   . Pseudochol deficiency Neg Hx     History  Substance Use Topics  . Smoking status: Former Smoker -- 0.0 packs/day  . Smokeless tobacco: Not on file  . Alcohol Use: No    Allergies:  Allergies  Allergen Reactions  . Nutritional Supplements Shortness Of Breath, Itching and Swelling    Grapes, strawberries, bananas, pine apple, oranges  . Tramadol Nausea And Vomiting    Pt states medication makes her sick    Prescriptions prior to admission  Medication Sig Dispense Refill  . acetaminophen (TYLENOL) 325 MG tablet Take 650 mg by mouth every 6 (six) hours as needed. For headache or pain      . loratadine (CLARITIN) 10 MG tablet Take 1 tablet (10 mg total) by mouth daily.  14 tablet  0  . Prenatal Vit-Fe Fumarate-FA (PRENATAL MULTIVITAMIN) TABS Take 1 tablet by mouth every morning.         Review of Systems  Gastrointestinal: Positive for abdominal pain (contractions).  All other systems reviewed and are negative.   Physical Exam   Blood pressure 122/77, resp. rate 18, last  menstrual period 01/21/2011, SpO2 100.00%.  Physical Exam  Constitutional: She is oriented to [place], place, and time. She appears well-developed and well-nourished. No distress.  HENT:  Head: Normocephalic.  Neck: Normal range of motion. Neck supple.  Cardiovascular: Normal rate, regular rhythm and normal heart sounds.   Respiratory: Effort normal and breath sounds normal.  GI: Soft. There is no tenderness.  Genitourinary: No bleeding around the vagina. Vaginal discharge (mucusy) found.       Cervix - closed  Neurological: She is alert and oriented to [place], place, and time.  Skin: Skin is warm and dry.   FHR 150's, +accels, reactive Toco - none  MAU Course  Procedures  Assessment and Plan  Braxton Hicks - Normal Exam  Plan: DC to home Labor Precautions  [facility] 09/20/2011, 5:19 PM

## 2011-10-15 ENCOUNTER — Other Ambulatory Visit: Payer: Self-pay | Admitting: Obstetrics

## 2011-10-15 DIAGNOSIS — Z3689 Encounter for other specified antenatal screening: Secondary | ICD-10-CM

## 2011-10-21 ENCOUNTER — Ambulatory Visit (HOSPITAL_COMMUNITY)
Admission: RE | Admit: 2011-10-21 | Discharge: 2011-10-21 | Disposition: A | Discharge status: Home / Self Care | Payer: Medicaid Other | Source: Ambulatory Visit | Attending: Obstetrics | Admitting: Obstetrics

## 2011-10-21 ENCOUNTER — Other Ambulatory Visit: Payer: Self-pay | Admitting: Obstetrics

## 2011-10-21 DIAGNOSIS — Z3689 Encounter for other specified antenatal screening: Secondary | ICD-10-CM

## 2011-10-21 DIAGNOSIS — O3660X0 Maternal care for excessive fetal growth, unspecified trimester, not applicable or unspecified: Secondary | ICD-10-CM | POA: Insufficient documentation

## 2011-10-21 DIAGNOSIS — E669 Obesity, unspecified: Secondary | ICD-10-CM | POA: Insufficient documentation

## 2011-10-24 ENCOUNTER — Other Ambulatory Visit: Payer: Self-pay | Admitting: Obstetrics & Gynecology

## 2011-10-24 ENCOUNTER — Ambulatory Visit (HOSPITAL_COMMUNITY)
Admission: RE | Admit: 2011-10-24 | Discharge: 2011-10-24 | Disposition: A | Discharge status: Home / Self Care | Payer: Medicaid Other | Source: Ambulatory Visit | Attending: Obstetrics & Gynecology | Admitting: Obstetrics & Gynecology

## 2011-10-24 DIAGNOSIS — O99810 Abnormal glucose complicating pregnancy: Secondary | ICD-10-CM | POA: Insufficient documentation

## 2011-10-24 DIAGNOSIS — O288 Other abnormal findings on antenatal screening of mother: Secondary | ICD-10-CM

## 2011-10-24 DIAGNOSIS — O368390 Maternal care for abnormalities of the fetal heart rate or rhythm, unspecified trimester, not applicable or unspecified: Secondary | ICD-10-CM | POA: Insufficient documentation

## 2011-10-24 DIAGNOSIS — O358XX0 Maternal care for other (suspected) fetal abnormality and damage, not applicable or unspecified: Secondary | ICD-10-CM | POA: Insufficient documentation

## 2011-10-29 ENCOUNTER — Encounter (HOSPITAL_COMMUNITY): Payer: Self-pay | Admitting: *Deleted

## 2011-10-29 ENCOUNTER — Inpatient Hospital Stay (HOSPITAL_COMMUNITY)
Admission: AD | Admit: 2011-10-29 | Discharge: 2011-10-29 | Disposition: A | Discharge status: Home / Self Care | Payer: Medicaid Other | Source: Ambulatory Visit | Attending: Obstetrics | Admitting: Obstetrics

## 2011-10-29 DIAGNOSIS — N949 Unspecified condition associated with female genital organs and menstrual cycle: Secondary | ICD-10-CM

## 2011-10-29 DIAGNOSIS — O99891 Other specified diseases and conditions complicating pregnancy: Secondary | ICD-10-CM | POA: Insufficient documentation

## 2011-10-29 DIAGNOSIS — O26899 Other specified pregnancy related conditions, unspecified trimester: Secondary | ICD-10-CM

## 2011-10-29 DIAGNOSIS — R109 Unspecified abdominal pain: Secondary | ICD-10-CM | POA: Insufficient documentation

## 2011-10-29 DIAGNOSIS — M549 Dorsalgia, unspecified: Secondary | ICD-10-CM | POA: Insufficient documentation

## 2011-10-29 LAB — URINALYSIS, ROUTINE W REFLEX MICROSCOPIC
Nitrite: NEGATIVE
Protein, ur: NEGATIVE mg/dL
Specific Gravity, Urine: 1.02 (ref 1.005–1.030)
Urobilinogen, UA: 0.2 mg/dL (ref 0.0–1.0)

## 2011-10-29 LAB — URINE MICROSCOPIC-ADD ON

## 2011-10-29 MED ORDER — CYCLOBENZAPRINE HCL 10 MG PO TABS
10.0000 mg | ORAL_TABLET | Freq: Once | ORAL | Status: AC
  Administered 2011-10-29: 10 mg via ORAL
  Filled 2011-10-29: qty 1

## 2011-10-29 NOTE — MAU Provider Note (Signed)
History     CSN: [id number]  Arrival date and time: 10/29/11 1714   First Provider Initiated Contact with Patient 10/29/11 1822      Chief Complaint  Patient presents with  . Abdominal Pain  . Back Pain   HPI  Pt states, " This morning I felt a sharp pain shoot through my vagina and I took a warm bath but it seems like it got worse. I have a lot of abdominal pressure and lower back achy cramping feeling."  Pt denies vaginal bleeding or leaking of fluid.  Sitting with legs closed increases the pain.  Keeping legs open improves the pain.  Pain is continuous in nature and "achy" in nature.     Past Medical History  Diagnosis Date  . Asthma     childhood /grew out of it  . ADHD (attention deficit hyperactivity disorder)   . Urinary tract infection     Past Surgical History  Procedure Date  . Wisdom tooth extraction   . Induced abortion     Family History  Problem Relation Age of Onset  . Diabetes Mother   . Hypertension Mother   . Diabetes Maternal Grandfather   . Anesthesia problems Neg Hx   . Hypotension Neg Hx   . Malignant hyperthermia Neg Hx   . Pseudochol deficiency Neg Hx     History  Substance Use Topics  . Smoking status: Former Smoker -- 0.0 packs/day  . Smokeless tobacco: Not on file  . Alcohol Use: No    Allergies:  Allergies  Allergen Reactions  . Other Anaphylaxis    Grapes, strawberries, bananas, pineapple, oranges  . Tramadol Nausea And Vomiting    Prescriptions prior to admission  Medication Sig Dispense Refill  . calcium carbonate (TUMS - DOSED IN MG ELEMENTAL CALCIUM) 500 MG chewable tablet Chew 1 tablet by mouth daily. For indigestion      . Prenatal Vit-Fe Fumarate-FA (PRENATAL MULTIVITAMIN) TABS Take 1 tablet by mouth every morning.       Marland Kitchen acetaminophen (TYLENOL) 325 MG tablet Take 650 mg by mouth every 6 (six) hours as needed. For headache or pain      . loratadine (CLARITIN) 10 MG tablet Take 1 tablet (10 mg total) by mouth daily.   14 tablet  0    Review of Systems  Genitourinary:       Vaginal pain  Musculoskeletal: Positive for back pain.  All other systems reviewed and are negative.   Physical Exam   Blood pressure 104/60, pulse 126, temperature 99.1 F (37.3 C), temperature source Oral, resp. rate 20, height 5\' 1"  (1.549 m), weight 125.703 kg (277 lb 2 oz), last menstrual period 01/21/2011.  Physical Exam  Constitutional: She is oriented to [place], place, and time. She appears well-developed and well-nourished.  HENT:  Head: Normocephalic.  Neck: Normal range of motion. Neck supple.  Cardiovascular: Normal rate, regular rhythm and normal heart sounds.   Respiratory: Effort normal and breath sounds normal.  GI: Soft. There is no tenderness.  Genitourinary: No bleeding around the vagina. Vaginal discharge (thick, white, no odor) found.       Cervix - closed/thick  Neurological: She is alert and oriented to [place], place, and time.  Skin: Skin is warm and dry.   FHR 130's, +accels, reactive Toco - irregular  MAU Course  Procedures  Flexeril 10 mg given  Assessment and Plan  Back Pain in Pregnancy Round Ligament Pain  Plan: DC to home Labor precautions  [facility] 10/29/2011, 6:23 PM

## 2011-10-29 NOTE — MAU Note (Signed)
Pt states, " This morning I felt a sharp pain shoot through my vagina and I took a warm bath but it seems like it got worse. I have a lot of abdominal pressure and low back achy cramping feeling."

## 2011-11-26 ENCOUNTER — Telehealth (HOSPITAL_COMMUNITY): Payer: Self-pay | Admitting: *Deleted

## 2011-11-26 ENCOUNTER — Inpatient Hospital Stay (HOSPITAL_COMMUNITY)
Admission: AD | Admit: 2011-11-26 | Discharge: 2011-11-26 | Disposition: A | Discharge status: Home / Self Care | Payer: Medicaid Other | Source: Ambulatory Visit | Attending: Obstetrics & Gynecology | Admitting: Obstetrics & Gynecology

## 2011-11-26 ENCOUNTER — Encounter (HOSPITAL_COMMUNITY): Payer: Self-pay | Admitting: *Deleted

## 2011-11-26 ENCOUNTER — Inpatient Hospital Stay (HOSPITAL_COMMUNITY)
Admission: RE | Admit: 2011-11-26 | Discharge: 2011-11-29 | DRG: 775 | Disposition: A | Discharge status: Home / Self Care | Payer: Medicaid Other | Source: Ambulatory Visit | Attending: Obstetrics | Admitting: Obstetrics

## 2011-11-26 DIAGNOSIS — Z22330 Carrier of Group B streptococcus: Secondary | ICD-10-CM

## 2011-11-26 DIAGNOSIS — O99892 Other specified diseases and conditions complicating childbirth: Secondary | ICD-10-CM | POA: Diagnosis present

## 2011-11-26 DIAGNOSIS — O479 False labor, unspecified: Secondary | ICD-10-CM | POA: Insufficient documentation

## 2011-11-26 DIAGNOSIS — O480 Post-term pregnancy: Principal | ICD-10-CM | POA: Diagnosis present

## 2011-11-26 LAB — CBC
HCT: 34.1 % — ABNORMAL LOW (ref 36.0–46.0)
Hemoglobin: 11.3 g/dL — ABNORMAL LOW (ref 12.0–15.0)
MCH: 29 pg (ref 26.0–34.0)
MCHC: 33.1 g/dL (ref 30.0–36.0)
MCV: 87.7 fL (ref 78.0–100.0)

## 2011-11-26 MED ORDER — LACTATED RINGERS IV SOLN: 500.0000 mL | INTRAVENOUS | Status: DC | PRN

## 2011-11-26 MED ORDER — TERBUTALINE SULFATE 1 MG/ML IJ SOLN: 0.2500 mg | Freq: Once | INTRAMUSCULAR | Status: AC | PRN

## 2011-11-26 MED ORDER — ONDANSETRON HCL 4 MG/2ML IJ SOLN: 4.0000 mg | Freq: Four times a day (QID) | INTRAMUSCULAR | Status: DC | PRN

## 2011-11-26 MED ORDER — OXYCODONE-ACETAMINOPHEN 5-325 MG PO TABS
1.0000 | ORAL_TABLET | ORAL | Status: DC | PRN
  Administered 2011-11-26 – 2011-11-27 (×2): 2 via ORAL
  Filled 2011-11-26 (×2): qty 2

## 2011-11-26 MED ORDER — FLEET ENEMA 7-19 GM/118ML RE ENEM: 1.0000 | ENEMA | RECTAL | Status: DC | PRN

## 2011-11-26 MED ORDER — OXYTOCIN BOLUS FROM INFUSION
250.0000 mL | Freq: Once | INTRAVENOUS | Status: DC
  Filled 2011-11-26: qty 500

## 2011-11-26 MED ORDER — ZOLPIDEM TARTRATE 5 MG PO TABS
5.0000 mg | ORAL_TABLET | Freq: Every evening | ORAL | Status: DC | PRN
  Administered 2011-11-26: 5 mg via ORAL
  Filled 2011-11-26: qty 1

## 2011-11-26 MED ORDER — PENICILLIN G POTASSIUM 5000000 UNITS IJ SOLR
5.0000 10*6.[IU] | Freq: Once | INTRAVENOUS | Status: AC
  Administered 2011-11-27: 5 10*6.[IU] via INTRAVENOUS
  Filled 2011-11-26: qty 5

## 2011-11-26 MED ORDER — ACETAMINOPHEN 325 MG PO TABS: 650.0000 mg | ORAL_TABLET | ORAL | Status: DC | PRN

## 2011-11-26 MED ORDER — LACTATED RINGERS IV SOLN
INTRAVENOUS | Status: DC
  Administered 2011-11-27 (×3): via INTRAVENOUS

## 2011-11-26 MED ORDER — CITRIC ACID-SODIUM CITRATE 334-500 MG/5ML PO SOLN
30.0000 mL | ORAL | Status: DC | PRN
  Filled 2011-11-26: qty 15

## 2011-11-26 MED ORDER — LIDOCAINE HCL (PF) 1 % IJ SOLN
30.0000 mL | INTRAMUSCULAR | Status: DC | PRN
  Filled 2011-11-26 (×2): qty 30

## 2011-11-26 MED ORDER — MISOPROSTOL 25 MCG QUARTER TABLET
25.0000 ug | ORAL_TABLET | ORAL | Status: DC | PRN
  Administered 2011-11-26 – 2011-11-27 (×2): 25 ug via VAGINAL
  Filled 2011-11-26: qty 0.25
  Filled 2011-11-26: qty 1
  Filled 2011-11-26: qty 0.25

## 2011-11-26 MED ORDER — OXYTOCIN 40 UNITS IN LACTATED RINGERS INFUSION - SIMPLE MED: 62.5000 mL/h | Freq: Once | INTRAVENOUS | Status: DC

## 2011-11-26 MED ORDER — PENICILLIN G POTASSIUM 5000000 UNITS IJ SOLR
2.5000 10*6.[IU] | INTRAVENOUS | Status: DC
  Administered 2011-11-27 (×3): 2.5 10*6.[IU] via INTRAVENOUS
  Filled 2011-11-26 (×6): qty 2.5

## 2011-11-26 MED ORDER — BUTORPHANOL TARTRATE 1 MG/ML IJ SOLN
1.0000 mg | INTRAMUSCULAR | Status: DC | PRN
  Administered 2011-11-27: 1 mg via INTRAVENOUS
  Filled 2011-11-26: qty 1

## 2011-11-26 MED ORDER — IBUPROFEN 600 MG PO TABS: 600.0000 mg | ORAL_TABLET | Freq: Four times a day (QID) | ORAL | Status: DC | PRN

## 2011-11-26 NOTE — Telephone Encounter (Signed)
Preadmission screen  

## 2011-11-26 NOTE — MAU Note (Signed)
PT ARRIVED VIA EMS-  FOR LABOR EVAL.      DENIES HSV AND MRSA.  WAS 1 CM .  IS AN INDUCTION FOR 730PM TODAY.

## 2011-11-26 NOTE — MAU Note (Signed)
Contractions every 6 minutes since 2 am this morning. Denies vaginal bleeding or leaking of fluid. States is scheduled for induction later today at 730pm.

## 2011-11-27 ENCOUNTER — Encounter (HOSPITAL_COMMUNITY): Payer: Self-pay | Admitting: Anesthesiology

## 2011-11-27 ENCOUNTER — Encounter (HOSPITAL_COMMUNITY): Payer: Self-pay

## 2011-11-27 ENCOUNTER — Inpatient Hospital Stay (HOSPITAL_COMMUNITY): Payer: Medicaid Other | Admitting: Anesthesiology

## 2011-11-27 LAB — ABO/RH: ABO/RH(D): O POS

## 2011-11-27 LAB — TYPE AND SCREEN: ABO/RH(D): O POS

## 2011-11-27 MED ORDER — ONDANSETRON HCL 4 MG/2ML IJ SOLN: 4.0000 mg | INTRAMUSCULAR | Status: DC | PRN

## 2011-11-27 MED ORDER — WITCH HAZEL-GLYCERIN EX PADS: 1.0000 "application " | MEDICATED_PAD | CUTANEOUS | Status: DC | PRN

## 2011-11-27 MED ORDER — FENTANYL 2.5 MCG/ML BUPIVACAINE 1/10 % EPIDURAL INFUSION (WH - ANES)
INTRAMUSCULAR | Status: DC | PRN
  Administered 2011-11-27: 12:00:00
  Administered 2011-11-27: 14 mL/h via EPIDURAL
  Administered 2011-11-27: 15:00:00

## 2011-11-27 MED ORDER — EPHEDRINE 5 MG/ML INJ
10.0000 mg | INTRAVENOUS | Status: DC | PRN
  Filled 2011-11-27: qty 4
  Filled 2011-11-27: qty 2

## 2011-11-27 MED ORDER — LACTATED RINGERS IV SOLN: 500.0000 mL | Freq: Once | INTRAVENOUS | Status: DC

## 2011-11-27 MED ORDER — PROMETHAZINE HCL 25 MG/ML IJ SOLN
25.0000 mg | Freq: Four times a day (QID) | INTRAMUSCULAR | Status: DC | PRN
  Administered 2011-11-27: 25 mg via INTRAMUSCULAR
  Filled 2011-11-27: qty 1

## 2011-11-27 MED ORDER — MEDROXYPROGESTERONE ACETATE 150 MG/ML IM SUSP: 150.0000 mg | INTRAMUSCULAR | Status: DC | PRN

## 2011-11-27 MED ORDER — DIPHENHYDRAMINE HCL 25 MG PO CAPS: 25.0000 mg | ORAL_CAPSULE | Freq: Four times a day (QID) | ORAL | Status: DC | PRN

## 2011-11-27 MED ORDER — OXYCODONE-ACETAMINOPHEN 5-325 MG PO TABS
1.0000 | ORAL_TABLET | ORAL | Status: DC | PRN
  Administered 2011-11-27 – 2011-11-29 (×4): 1 via ORAL
  Administered 2011-11-29: 2 via ORAL
  Administered 2011-11-29: 1 via ORAL
  Filled 2011-11-27 (×4): qty 1
  Filled 2011-11-27 (×2): qty 2

## 2011-11-27 MED ORDER — TETANUS-DIPHTH-ACELL PERTUSSIS 5-2.5-18.5 LF-MCG/0.5 IM SUSP: 0.5000 mL | Freq: Once | INTRAMUSCULAR | Status: DC

## 2011-11-27 MED ORDER — EPHEDRINE 5 MG/ML INJ
10.0000 mg | INTRAVENOUS | Status: DC | PRN
  Filled 2011-11-27: qty 2

## 2011-11-27 MED ORDER — IBUPROFEN 600 MG PO TABS
600.0000 mg | ORAL_TABLET | Freq: Four times a day (QID) | ORAL | Status: DC
  Administered 2011-11-27 – 2011-11-29 (×6): 600 mg via ORAL
  Filled 2011-11-27 (×6): qty 1

## 2011-11-27 MED ORDER — PHENYLEPHRINE 40 MCG/ML (10ML) SYRINGE FOR IV PUSH (FOR BLOOD PRESSURE SUPPORT)
80.0000 ug | PREFILLED_SYRINGE | INTRAVENOUS | Status: DC | PRN
  Filled 2011-11-27: qty 2

## 2011-11-27 MED ORDER — NALBUPHINE SYRINGE 5 MG/0.5 ML
10.0000 mg | INJECTION | INTRAMUSCULAR | Status: DC
  Administered 2011-11-27: 10 mg via INTRAVENOUS
  Filled 2011-11-27 (×10): qty 1

## 2011-11-27 MED ORDER — BENZOCAINE-MENTHOL 20-0.5 % EX AERO
1.0000 "application " | INHALATION_SPRAY | CUTANEOUS | Status: DC | PRN
  Administered 2011-11-29: 1 via TOPICAL
  Filled 2011-11-27 (×2): qty 56

## 2011-11-27 MED ORDER — OXYTOCIN 40 UNITS IN LACTATED RINGERS INFUSION - SIMPLE MED
1.0000 m[IU]/min | INTRAVENOUS | Status: DC
  Administered 2011-11-27: 2 m[IU]/min via INTRAVENOUS
  Filled 2011-11-27: qty 1000

## 2011-11-27 MED ORDER — ONDANSETRON HCL 4 MG PO TABS: 4.0000 mg | ORAL_TABLET | ORAL | Status: DC | PRN

## 2011-11-27 MED ORDER — LIDOCAINE HCL (PF) 1 % IJ SOLN
INTRAMUSCULAR | Status: DC | PRN
  Administered 2011-11-27: 8 mL
  Administered 2011-11-27: 30 mL
  Administered 2011-11-27: 8 mL

## 2011-11-27 MED ORDER — FENTANYL 2.5 MCG/ML BUPIVACAINE 1/10 % EPIDURAL INFUSION (WH - ANES)
14.0000 mL/h | INTRAMUSCULAR | Status: DC
  Filled 2011-11-27 (×3): qty 60

## 2011-11-27 MED ORDER — PRENATAL MULTIVITAMIN CH
1.0000 | ORAL_TABLET | Freq: Every day | ORAL | Status: DC
  Administered 2011-11-28 – 2011-11-29 (×2): 1 via ORAL
  Filled 2011-11-27 (×2): qty 1

## 2011-11-27 MED ORDER — PHENYLEPHRINE 40 MCG/ML (10ML) SYRINGE FOR IV PUSH (FOR BLOOD PRESSURE SUPPORT)
80.0000 ug | PREFILLED_SYRINGE | INTRAVENOUS | Status: DC | PRN
  Filled 2011-11-27: qty 2
  Filled 2011-11-27: qty 5

## 2011-11-27 MED ORDER — FERROUS SULFATE 325 (65 FE) MG PO TABS
325.0000 mg | ORAL_TABLET | Freq: Two times a day (BID) | ORAL | Status: DC
  Administered 2011-11-28 – 2011-11-29 (×3): 325 mg via ORAL
  Filled 2011-11-27 (×3): qty 1

## 2011-11-27 MED ORDER — MEASLES, MUMPS & RUBELLA VAC ~~LOC~~ INJ
0.5000 mL | INJECTION | Freq: Once | SUBCUTANEOUS | Status: DC
Start: 2011-11-28 — End: 2011-11-28

## 2011-11-27 MED ORDER — ZOLPIDEM TARTRATE 5 MG PO TABS: 5.0000 mg | ORAL_TABLET | Freq: Every evening | ORAL | Status: DC | PRN

## 2011-11-27 MED ORDER — LANOLIN HYDROUS EX OINT: TOPICAL_OINTMENT | CUTANEOUS | Status: DC | PRN

## 2011-11-27 MED ORDER — DIBUCAINE 1 % RE OINT: 1.0000 "application " | TOPICAL_OINTMENT | RECTAL | Status: DC | PRN

## 2011-11-27 MED ORDER — SENNOSIDES-DOCUSATE SODIUM 8.6-50 MG PO TABS
2.0000 | ORAL_TABLET | Freq: Every day | ORAL | Status: DC
  Administered 2011-11-27 – 2011-11-28 (×2): 2 via ORAL

## 2011-11-27 MED ORDER — NALBUPHINE SYRINGE 5 MG/0.5 ML
10.0000 mg | INJECTION | Freq: Four times a day (QID) | INTRAMUSCULAR | Status: DC | PRN
  Administered 2011-11-27: 10 mg via INTRAMUSCULAR
  Filled 2011-11-27 (×2): qty 1

## 2011-11-27 MED ORDER — DIPHENHYDRAMINE HCL 50 MG/ML IJ SOLN: 12.5000 mg | INTRAMUSCULAR | Status: DC | PRN

## 2011-11-27 MED ORDER — MAGNESIUM HYDROXIDE 400 MG/5ML PO SUSP: 30.0000 mL | ORAL | Status: DC | PRN

## 2011-11-27 MED ORDER — LACTATED RINGERS IV SOLN: INTRAVENOUS | Status: DC

## 2011-11-27 NOTE — Anesthesia Procedure Notes (Signed)
Epidural Patient location during procedure: OB Start time: 11/27/2011 8:10 AM End time: 11/27/2011 8:18 AM Reason for block: procedure for pain  Staffing Anesthesiologist: Sandrea Hughs Performed by: anesthesiologist   Preanesthetic Checklist Completed: patient identified, site marked, surgical consent, pre-op evaluation, timeout performed, IV checked, risks and benefits discussed and monitors and equipment checked  Epidural Patient position: sitting Prep: site prepped and draped and DuraPrep Patient monitoring: continuous pulse ox and blood pressure Approach: midline Injection technique: LOR air  Needle:  Needle type: Tuohy  Needle gauge: 17 G Needle length: 9 cm Needle insertion depth: 9 cm Catheter type: closed end flexible Catheter size: 19 Gauge Catheter at skin depth: 15 cm Test dose: negative and Other  Assessment Sensory level: T8 Events: blood not aspirated, injection not painful, no injection resistance, negative IV test and no paresthesia

## 2011-11-27 NOTE — Anesthesia Preprocedure Evaluation (Signed)
Anesthesia Evaluation  Patient identified by MRN, date of birth, ID band Patient awake    Reviewed: Allergy & Precautions, H&P , NPO status , Patient's Chart, lab work & pertinent test results  Airway Mallampati: III TM Distance: >3 FB Neck ROM: full    Dental No notable dental hx.    Pulmonary    Pulmonary exam normal       Cardiovascular negative cardio ROS      Neuro/Psych PSYCHIATRIC DISORDERS Depression negative neurological ROS     GI/Hepatic negative GI ROS, Neg liver ROS,   Endo/Other  Morbid obesity  Renal/GU negative Renal ROS     Musculoskeletal negative musculoskeletal ROS (+)   Abdominal (+) + obese,   Peds negative pediatric ROS (+)  Hematology negative hematology ROS (+)   Anesthesia Other Findings   Reproductive/Obstetrics (+) Pregnancy                           Anesthesia Physical Anesthesia Plan  ASA: III  Anesthesia Plan: Epidural   Post-op Pain Management:    Induction:   Airway Management Planned:   Additional Equipment:   Intra-op Plan:   Post-operative Plan:   Informed Consent: I have reviewed the patients History and Physical, chart, labs and discussed the procedure including the risks, benefits and alternatives for the proposed anesthesia with the patient or authorized representative who has indicated his/her understanding and acceptance.     Plan Discussed with:   Anesthesia Plan Comments:         Anesthesia Quick Evaluation

## 2011-11-27 NOTE — H&P (Signed)
Teresa [name] is a 21 y.o. female presenting for IOL. Maternal Medical History:  Reason for admission: 21 yo G3 P0   EDC 11-21-11.  Presents for IOL for postdates.  Fetal activity: Perceived fetal activity is normal.   Last perceived fetal movement was within the past hour.    Prenatal complications: no prenatal complications   OB History    Grav Para Term Preterm Abortions TAB SAB Ect Mult Living   3 0 0 0 2 1 1 0 0 0      Past Medical History  Diagnosis Date  . Asthma     childhood /grew out of it  . ADHD (attention deficit hyperactivity disorder)   . Urinary tract infection   . Depression    Past Surgical History  Procedure Date  . Wisdom tooth extraction   . Induced abortion    Family History: family history includes Diabetes in her maternal grandfather and mother and Hypertension in her mother.  There is no history of Anesthesia problems, and Hypotension, and Malignant hyperthermia, and Pseudochol deficiency, . Social History:  reports that she has quit smoking. She does not have any smokeless tobacco history on file. She reports that she drinks alcohol. She reports that she does not use illicit drugs.   Prenatal Transfer Tool  Maternal Diabetes: No Genetic Screening: Normal Maternal Ultrasounds/Referrals: Normal Fetal Ultrasounds or other Referrals:  None Maternal Substance Abuse:  No Significant Maternal Medications:  None Significant Maternal Lab Results:  Lab values include: Group B Strep positive Other Comments:  None  Review of Systems  All other systems reviewed and are negative.    Dilation: 3.5 Effacement (%): 80 Station: -2 Exam by:: LCarpenter,RN Blood pressure 120/58, pulse 75, temperature 98.3 F (36.8 C), temperature source Oral, resp. rate 20, height 5\' 1"  (1.549 m), weight 129.275 kg (285 lb), last menstrual period 01/21/2011. Maternal Exam:  Uterine Assessment: Contraction strength is moderate.  Contraction frequency is regular.    Abdomen: Patient reports no abdominal tenderness. Fetal presentation: vertex  Introitus: Normal vulva. Normal vagina.    Physical Exam  Nursing note and vitals reviewed. Constitutional: She is oriented to Teresa [name], place, and time. She appears well-developed and well-nourished.  HENT:  Head: Normocephalic and atraumatic.  Eyes: Conjunctivae are normal. Pupils are equal, round, and reactive to light.  Neck: Normal range of motion. Neck supple.  Cardiovascular: Normal rate and regular rhythm.   Respiratory: Effort normal and breath sounds normal.  GI: Soft.  Musculoskeletal: Normal range of motion.  Neurological: She is alert and oriented to Teresa [name], place, and time.  Skin: Skin is warm and dry.    Prenatal labs: ABO, Rh: --/--/O POS, O POS (07/30 1950) Antibody: NEG (07/30 1950) Rubella: Immune (12/04 0000) RPR: NON REACTIVE (07/30 1950)  HBsAg: Negative (12/04 0000)  HIV: Non-reactive (12/04 0000)  GBS: Positive (07/01 0000)   Assessment/Plan: Postdates.  2 stage IOL.   Teresa [name] A 11/27/2011, 8:11 AM

## 2011-11-27 NOTE — Progress Notes (Signed)
Teresa [name] is a 21 y.o. G3P0020 at [redacted]w[redacted]d by LMP admitted for induction of labor due to Post dates. Due date 11-21-11.  Subjective:   Objective: BP 120/58  Pulse 75  Temp 98.3 F (36.8 C) (Oral)  Resp 20  Ht 5\' 1"  (1.549 m)  Wt 129.275 kg (285 lb)  BMI 53.85 kg/m2  LMP 01/21/2011      FHT:  FHR: 130 bpm, variability: moderate,  accelerations:  Present,  decelerations:  Absent UC:   regular, every 3-4 minutes SVE:   Dilation: 3.5 Effacement (%): 80 Station: -2 Exam by:: LCarpenter,RN  Labs: Lab Results  Component Value Date   WBC 7.8 11/26/2011   HGB 11.3* 11/26/2011   HCT 34.1* 11/26/2011   MCV 87.7 11/26/2011   PLT 267 11/26/2011    Assessment / Plan: Induction of labor due to postterm,  progressing well on pitocin  Labor: Progressing on Pitocin, will continue to increase then AROM Preeclampsia:  n/a Fetal Wellbeing:  Category I Pain Control:  Epidural I/D:  n/a Anticipated MOD:  NSVD  Corvin Sorbo A 11/27/2011, 8:16 AM

## 2011-11-28 MED ORDER — PNEUMOCOCCAL VAC POLYVALENT 25 MCG/0.5ML IJ INJ
0.5000 mL | INJECTION | INTRAMUSCULAR | Status: AC
  Administered 2011-11-29: 0.5 mL via INTRAMUSCULAR
  Filled 2011-11-28: qty 0.5

## 2011-11-28 NOTE — Progress Notes (Signed)
UR chart review completed.  

## 2011-11-28 NOTE — Progress Notes (Signed)
Post Partum Day 1 Subjective: no complaints  Objective: Blood pressure 102/68, pulse 80, temperature 98.5 F (36.9 C), temperature source Oral, resp. rate 18, height 5\' 1"  (1.549 m), weight 129.275 kg (285 lb), last menstrual period 01/21/2011, SpO2 98.00%, unknown if currently breastfeeding.  Physical Exam:  General: alert and no distress Lochia: appropriate Uterine Fundus: firm Incision: healing well DVT Evaluation: No evidence of DVT seen on physical exam.   Basename 11/26/11 1950  HGB 11.3*  HCT 34.1*    Assessment/Plan: Plan for discharge tomorrow   LOS: 2 days   Christino Mcglinchey A 11/28/2011, 8:19 AM

## 2011-11-28 NOTE — Anesthesia Postprocedure Evaluation (Signed)
  Anesthesia Post-op Note  Patient: Teresa [name]  Procedure(s) Performed: * No procedures listed *  Patient Location: PACU and Mother/Baby  Anesthesia Type: Epidural  Level of Consciousness: awake, alert  and oriented  Airway and Oxygen Therapy: Patient Spontanous Breathing  Post-op Pain: none  Post-op Assessment: Post-op Vital signs reviewed and Patient's Cardiovascular Status Stable  Post-op Vital Signs: Reviewed and stable  Complications: No apparent anesthesia complications

## 2011-11-29 MED ORDER — LEVONORGEST-ETH ESTRAD 91-DAY 0.15-0.03 &0.01 MG PO TABS: 1.0000 | ORAL_TABLET | Freq: Every day | ORAL | Status: DC

## 2011-11-29 MED ORDER — IBUPROFEN 800 MG PO TABS: 800.0000 mg | ORAL_TABLET | Freq: Three times a day (TID) | ORAL | Status: DC | PRN

## 2011-11-29 MED ORDER — OXYCODONE-ACETAMINOPHEN 5-325 MG PO TABS: 1.0000 | ORAL_TABLET | ORAL | Status: AC | PRN

## 2011-11-29 MED ORDER — IBUPROFEN 600 MG PO TABS: 600.0000 mg | ORAL_TABLET | Freq: Four times a day (QID) | ORAL | Status: DC

## 2011-11-29 MED ORDER — OXYCODONE-ACETAMINOPHEN 5-325 MG PO TABS: 1.0000 | ORAL_TABLET | ORAL | Status: DC | PRN

## 2011-11-29 NOTE — Discharge Summary (Signed)
Obstetric Discharge Summary Reason for Admission: induction of labor Prenatal Procedures: ultrasound Intrapartum Procedures: spontaneous vaginal delivery Postpartum Procedures: none Complications-Operative and Postpartum: none Hemoglobin  Date Value Range Status  11/26/2011 11.3* 12.0 - 15.0 g/dL Final     HCT  Date Value Range Status  11/26/2011 34.1* 36.0 - 46.0 % Final    Physical Exam:  General: alert and no distress Lochia: appropriate Uterine Fundus: firm Incision: healing well DVT Evaluation: No evidence of DVT seen on physical exam.  Discharge Diagnoses: Term Pregnancy-delivered  Discharge Information: Date: 11/29/2011 Activity: pelvic rest Diet: routine Medications: PNV, Ibuprofen, Colace and Percocet Condition: stable Instructions: refer to practice specific booklet Discharge to: home Follow-up Information    Follow up with Teresa Char A, MD. Schedule an appointment as soon as possible for Teresa [name] visit in 6 weeks.   Contact information:   [address], [address] [place] [address] [phone]          Newborn Data: Live born female  Birth Weight: 8 lb 5.7 oz (3790 g) APGAR: 6, 9  Home with mother.  Teresa Teresa [name] Teresa [name] 11/29/2011, 10:57 AM

## 2011-11-29 NOTE — Progress Notes (Signed)
Post Partum Day 2 Subjective: no complaints  Objective: Blood pressure 131/87, pulse 98, temperature 98.1 F (36.7 C), temperature source Oral, resp. rate 18, height 5\' 1"  (1.549 m), weight 129.275 kg (285 lb), last menstrual period 01/21/2011, SpO2 98.00%, unknown if currently breastfeeding.  Physical Exam:  General: alert and no distress Lochia: appropriate Uterine Fundus: firm Incision: healing well DVT Evaluation: No evidence of DVT seen on physical exam.   Basename 11/26/11 1950  HGB 11.3*  HCT 34.1*    Assessment/Plan: Discharge home   LOS: 3 days   HARPER,CHARLES A 11/29/2011, 10:46 AM

## 2011-12-01 ENCOUNTER — Inpatient Hospital Stay (HOSPITAL_COMMUNITY): Admission: RE | Admit: 2011-12-01 | Payer: Medicaid Other | Source: Ambulatory Visit

## 2012-01-10 ENCOUNTER — Encounter (HOSPITAL_COMMUNITY): Payer: Self-pay

## 2012-01-10 ENCOUNTER — Emergency Department (HOSPITAL_COMMUNITY)
Admission: EM | Admit: 2012-01-10 | Discharge: 2012-01-10 | Disposition: A | Discharge status: Home / Self Care | Payer: Medicaid Other | Attending: Emergency Medicine | Admitting: Emergency Medicine

## 2012-01-10 ENCOUNTER — Emergency Department (HOSPITAL_COMMUNITY): Payer: Medicaid Other

## 2012-01-10 DIAGNOSIS — F909 Attention-deficit hyperactivity disorder, unspecified type: Secondary | ICD-10-CM | POA: Insufficient documentation

## 2012-01-10 DIAGNOSIS — F3289 Other specified depressive episodes: Secondary | ICD-10-CM | POA: Insufficient documentation

## 2012-01-10 DIAGNOSIS — J40 Bronchitis, not specified as acute or chronic: Secondary | ICD-10-CM | POA: Insufficient documentation

## 2012-01-10 DIAGNOSIS — Z87891 Personal history of nicotine dependence: Secondary | ICD-10-CM | POA: Insufficient documentation

## 2012-01-10 DIAGNOSIS — F329 Major depressive disorder, single episode, unspecified: Secondary | ICD-10-CM | POA: Insufficient documentation

## 2012-01-10 DIAGNOSIS — G43909 Migraine, unspecified, not intractable, without status migrainosus: Secondary | ICD-10-CM | POA: Insufficient documentation

## 2012-01-10 MED ORDER — IBUPROFEN 400 MG PO TABS
600.0000 mg | ORAL_TABLET | Freq: Once | ORAL | Status: AC
  Administered 2012-01-10: 600 mg via ORAL
  Filled 2012-01-10: qty 1

## 2012-01-10 MED ORDER — METOCLOPRAMIDE HCL 10 MG PO TABS
10.0000 mg | ORAL_TABLET | Freq: Once | ORAL | Status: AC
  Administered 2012-01-10: 10 mg via ORAL
  Filled 2012-01-10: qty 1

## 2012-01-10 MED ORDER — AZITHROMYCIN 250 MG PO TABS: 250.0000 mg | ORAL_TABLET | Freq: Every day | ORAL | Status: AC

## 2012-01-10 MED ORDER — METOCLOPRAMIDE HCL 10 MG PO TABS: 10.0000 mg | ORAL_TABLET | Freq: Four times a day (QID) | ORAL | Status: DC

## 2012-01-10 MED ORDER — IBUPROFEN 600 MG PO TABS: 600.0000 mg | ORAL_TABLET | Freq: Four times a day (QID) | ORAL | Status: AC | PRN

## 2012-01-10 NOTE — ED Provider Notes (Signed)
History     CSN: [id number]  Arrival date & time 01/10/12  1525   First MD Initiated Contact with Patient 01/10/12 1706      Chief Complaint  Patient presents with  . Sore Throat  . Cough  . Respiratory Distress    (Consider location/radiation/quality/duration/timing/severity/associated sxs/prior treatment) HPI Pt has had 3 days of nasal congestion, sore throat, and productive cough with chills and myalgias. She now c/o migraine similar to previous migraine with bitemporal pounding and photophobia. No neck stiffness or pain. No focal weakness or numbness.  Past Medical History  Diagnosis Date  . Asthma     childhood /grew out of it  . ADHD (attention deficit hyperactivity disorder)   . Urinary tract infection   . Depression     Past Surgical History  Procedure Date  . Wisdom tooth extraction   . Induced abortion     Family History  Problem Relation Age of Onset  . Diabetes Mother   . Hypertension Mother   . Diabetes Maternal Grandfather   . Anesthesia problems Neg Hx   . Hypotension Neg Hx   . Malignant hyperthermia Neg Hx   . Pseudochol deficiency Neg Hx     History  Substance Use Topics  . Smoking status: Former Smoker -- 0.0 packs/day  . Smokeless tobacco: Not on file  . Alcohol Use: 0.0 oz/week     denies use currently but was a heavy user    OB History    Grav Para Term Preterm Abortions TAB SAB Ect Mult Living   3 1 1  0 2 1 1  0 0 1      Review of Systems  Constitutional: Positive for chills and fatigue. Negative for fever.  HENT: Positive for congestion and sore throat. Negative for neck pain and neck stiffness.   Eyes: Positive for photophobia.  Respiratory: Positive for cough. Negative for shortness of breath and wheezing.   Cardiovascular: Negative for chest pain, palpitations and leg swelling.  Gastrointestinal: Positive for nausea. Negative for vomiting and abdominal pain.  Musculoskeletal: Positive for myalgias.  Neurological: Positive  for headaches. Negative for dizziness, weakness, light-headedness and numbness.    Allergies  Other and Tramadol  Home Medications   Current Outpatient Rx  Name Route Sig Dispense Refill  . AMOXICILLIN 500 MG PO CAPS Oral Take 500 mg by mouth 3 (three) times daily.    . IBUPROFEN 200 MG PO TABS Oral Take 400 mg by mouth every 6 (six) hours as needed. For pain    . LEVONORGEST-ETH ESTRAD 91-DAY 0.15-0.03 &0.01 MG PO TABS Oral Take 1 tablet by mouth daily. 1 Package 4  . SERTRALINE HCL 50 MG PO TABS Oral Take 50 mg by mouth daily.    . AZITHROMYCIN 250 MG PO TABS Oral Take 1 tablet (250 mg total) by mouth daily. Take first 2 tablets together, then 1 every day until finished. 6 tablet 0  . IBUPROFEN 600 MG PO TABS Oral Take 1 tablet (600 mg total) by mouth every 6 (six) hours as needed for pain. 30 tablet 0  . METOCLOPRAMIDE HCL 10 MG PO TABS Oral Take 1 tablet (10 mg total) by mouth every 6 (six) hours. 30 tablet 0    BP 139/83  Pulse 89  Temp 99.3 F (37.4 C) (Oral)  Resp 16  SpO2 97%  LMP 01/10/2012  Breastfeeding? No  Physical Exam  Nursing note and vitals reviewed. Constitutional: She is oriented to [place], place, and time. She appears well-developed  and well-nourished. No distress.  HENT:  Head: Normocephalic and atraumatic.  Mouth/Throat: Oropharynx is clear and moist.       Mild post pharynx erythema with no exudates  Eyes: EOM are normal. Pupils are equal, round, and reactive to light.  Neck: Normal range of motion. Neck supple.       No meningismus   Cardiovascular: Normal rate and regular rhythm.   Pulmonary/Chest: Effort normal and breath sounds normal. No respiratory distress. She has no wheezes. She has no rales.  Abdominal: Soft. Bowel sounds are normal. She exhibits no distension and no mass. There is no tenderness. There is no rebound and no guarding.  Musculoskeletal: Normal range of motion. She exhibits no edema and no tenderness.  Lymphadenopathy:    She  has no cervical adenopathy.  Neurological: She is alert and oriented to [place], place, and time.       5/5 motor, sensation intact  Skin: Skin is warm and dry. No rash noted. No erythema.  Psychiatric: She has a normal mood and affect. Her behavior is normal.    ED Course  Procedures (including critical care time)  Labs Reviewed - No data to display Dg Chest 2 View  01/10/2012  *RADIOLOGY REPORT*  Clinical Data: Sore throat, cough and respiratory distress.  CHEST - 2 VIEW  Comparison: 01/02/2010.  Findings: The heart size and mediastinal contours are stable. There is mild central airway thickening.  No focal airspace disease is seen on the frontal examination.  On the lateral view, mildly increased density in the retrosternal area is probably due to overlap with the patient's arms.  No consolidation or significant pleural effusion is seen.  The osseous structures appear normal.  IMPRESSION: Central airway thickening suggesting bronchitis.  No evidence of pneumonia.   Original Report Authenticated By: Gerrianne Scale, M.D.      1. Bronchitis   2. Migraine       MDM  Suspect URI/bronchitis. Will R/O pneumonia with CXR        Loren Racer, MD 01/10/12 1821

## 2012-01-10 NOTE — ED Notes (Signed)
Pt complains of sore throat, headache, and cough noted in traige, sts chest feels like it will cave in.

## 2012-02-15 ENCOUNTER — Encounter (HOSPITAL_COMMUNITY): Payer: Self-pay | Admitting: Emergency Medicine

## 2012-02-15 ENCOUNTER — Emergency Department (HOSPITAL_COMMUNITY)
Admission: EM | Admit: 2012-02-15 | Discharge: 2012-02-15 | Discharge status: Against Medical Advice | Payer: Medicaid Other | Attending: Emergency Medicine | Admitting: Emergency Medicine

## 2012-02-15 DIAGNOSIS — R109 Unspecified abdominal pain: Secondary | ICD-10-CM | POA: Insufficient documentation

## 2012-02-15 LAB — URINALYSIS, ROUTINE W REFLEX MICROSCOPIC
Ketones, ur: NEGATIVE mg/dL
Nitrite: NEGATIVE
pH: 7 (ref 5.0–8.0)

## 2012-02-15 LAB — URINE MICROSCOPIC-ADD ON

## 2012-02-15 NOTE — ED Notes (Signed)
Patient seen leaving ED per [employer].

## 2012-02-15 NOTE — ED Notes (Signed)
Pt sts some lower abd cramping x 2 weeks and requesting pregnancy test; pt denies vaginal discharge or UTI sx

## 2012-02-18 ENCOUNTER — Encounter (HOSPITAL_COMMUNITY): Payer: Self-pay

## 2012-02-18 ENCOUNTER — Inpatient Hospital Stay (HOSPITAL_COMMUNITY)
Admission: AD | Admit: 2012-02-18 | Discharge: 2012-02-18 | Disposition: A | Discharge status: Home / Self Care | Payer: Medicaid Other | Source: Ambulatory Visit | Attending: Obstetrics | Admitting: Obstetrics

## 2012-02-18 DIAGNOSIS — N949 Unspecified condition associated with female genital organs and menstrual cycle: Secondary | ICD-10-CM | POA: Insufficient documentation

## 2012-02-18 DIAGNOSIS — R109 Unspecified abdominal pain: Secondary | ICD-10-CM | POA: Insufficient documentation

## 2012-02-18 DIAGNOSIS — A599 Trichomoniasis, unspecified: Secondary | ICD-10-CM

## 2012-02-18 DIAGNOSIS — A5901 Trichomonal vulvovaginitis: Secondary | ICD-10-CM | POA: Insufficient documentation

## 2012-02-18 LAB — WET PREP, GENITAL: Clue Cells Wet Prep HPF POC: NONE SEEN

## 2012-02-18 LAB — HCG, SERUM, QUALITATIVE: Preg, Serum: NEGATIVE

## 2012-02-18 MED ORDER — METRONIDAZOLE 500 MG PO TABS: 500.0000 mg | ORAL_TABLET | Freq: Two times a day (BID) | ORAL | Status: AC

## 2012-02-18 NOTE — MAU Note (Signed)
Was at [facility] on 10/19, had to leave to get daughter- did not get seen; preg test was neg.

## 2012-02-18 NOTE — MAU Provider Note (Signed)
History     CSN: [id number]  Arrival date and time: 02/18/12 1010   First Provider Initiated Contact with Patient 02/18/12 1106      Chief Complaint  Patient presents with  . Abdominal Pain  . Vaginal Discharge  . Possible Pregnancy   HPI Thinks she might be pregnant. Pain in lower abdomen for past couple of weeks, worse last few days. Delivered by SVD 11/27/11 and had been bleeding/spotting until 01/12/12. Has not had a period since then, but resumed sexual intercourse on 9/19. On Seasonique. Negative pregnancy test at home. Feels like she might be pregnant - breasts/nipples itching and leaking. Not breastfeeding.  Her pelvic pain is achy, constant. She has thick white vaginal discharge and thinks she might have a yeast infection. She is also worried about STDs because she is not using condoms.  Has been on amoxicillin for bronchitis.  OB History    Grav Para Term Preterm Abortions TAB SAB Ect Mult Living   3 1 1  0 2 1 1  0 0 1      Past Medical History  Diagnosis Date  . Asthma     childhood /grew out of it  . ADHD (attention deficit hyperactivity disorder)   . Urinary tract infection   . Depression     Past Surgical History  Procedure Date  . Wisdom tooth extraction   . Induced abortion     Family History  Problem Relation Age of Onset  . Diabetes Mother   . Hypertension Mother   . Diabetes Maternal Grandfather   . Anesthesia problems Neg Hx   . Hypotension Neg Hx   . Malignant hyperthermia Neg Hx   . Pseudochol deficiency Neg Hx     History  Substance Use Topics  . Smoking status: Current Every Day Smoker -- 0.0 packs/day    Types: Cigarettes  . Smokeless tobacco: Not on file  . Alcohol Use: 0.0 oz/week     denies use currently but was a heavy user    Allergies:  Allergies  Allergen Reactions  . Other Anaphylaxis    Grapes, strawberries, bananas, pineapple, oranges  . Tramadol Nausea And Vomiting    Prescriptions prior to admission  Medication  Sig Dispense Refill  . Levonorgestrel-Ethinyl Estradiol (SEASONIQUE) 0.15-0.03 &0.01 MG tablet Take 1 tablet by mouth daily.  1 Package  4  . amoxicillin (AMOXIL) 500 MG capsule Take 500 mg by mouth 3 (three) times daily.        Review of Systems  Constitutional: Negative for fever and chills.  Eyes: Negative for blurred vision and double vision.  Respiratory: Negative for shortness of breath.   Cardiovascular: Negative for chest pain.  Gastrointestinal: Negative for nausea, vomiting, diarrhea and constipation.  Genitourinary: Negative for dysuria, urgency and frequency.  Neurological: Negative for dizziness and headaches.   Physical Exam   Blood pressure 130/71, pulse 87, temperature 98.1 F (36.7 C), temperature source Oral, resp. rate 20, height 5' 2.25" (1.581 m), weight 116.574 kg (257 lb), not currently breastfeeding.  Physical Exam  Constitutional: She is oriented to [place], place, and time. She appears well-developed and well-nourished. No distress.  HENT:  Head: Normocephalic and atraumatic.  Eyes: Conjunctivae normal and EOM are normal.  Neck: Normal range of motion. Neck supple.  Cardiovascular: Normal rate, regular rhythm and normal heart sounds.   Respiratory: Effort normal and breath sounds normal. No respiratory distress.  GI: Soft. She exhibits no distension. There is Tenderness: mild supraprubic.. There is no rebound and  no guarding.  Genitourinary:       Normal external genitalia, no lesions. Normal vagina, milky white discharge. Normal, parous cervix. No CMT. Uterus normal size, not fixed or deviated. No adnexal tenderness or masses.  Musculoskeletal: Normal range of motion. She exhibits no edema and no tenderness.  Neurological: She is alert and oriented to [place], place, and time.  Skin: Skin is warm and dry.  Psychiatric: She has a normal mood and affect.   Results for orders placed during the hospital encounter of 02/18/12 (from the past 24 hour(s))  POCT  PREGNANCY, URINE     Status: Normal   Collection Time   02/18/12 11:09 AM      Component Value Range   Preg Test, Ur NEGATIVE  NEGATIVE  WET PREP, GENITAL     Status: Abnormal   Collection Time   02/18/12 11:21 AM      Component Value Range   Yeast Wet Prep HPF POC NONE SEEN  NONE SEEN   Trich, Wet Prep FEW (*) NONE SEEN   Clue Cells Wet Prep HPF POC NONE SEEN  NONE SEEN   WBC, Wet Prep HPF POC MODERATE BACTERIA SEEN (*) NONE SEEN  HCG, SERUM, QUALITATIVE     Status: Normal   Collection Time   02/18/12 11:25 AM      Component Value Range   Preg, Serum NEGATIVE  NEGATIVE   GC/Chlamydia pending  MAU Course  Procedures    Assessment and Plan  21 y.o. [id number] with pelvic pain/cramping, vaginal discomfort - worried about pregnancy and STDs - trichomonas - treat with Flagyl, discussed partner treatment - GC/Chlamydia pending - will treat if positive. Recommend partner get checked as well - Gyn outpatient visit for contraception and other STD testing (HIV, etc.)  Napoleon Form 02/18/2012, 11:08 AM

## 2012-02-18 NOTE — MAU Note (Signed)
Patient is in with c/o abdominal pain. She states that she may be pregnant. She states that she have not had a period this month. (last month she stopped bleeding, vaginal delivery in July and bled till September 15th, 2013). Patient states that she is a seasonique (which she started 2 weeks after delivery). She also thinks she may have a yeast infection as well.

## 2012-02-18 NOTE — MAU Note (Signed)
Past couple wks, stomach has been really hurting bad.  Has done 2 preg tests, they were both neg.  With daughter had 3 neg tests prior to a pos test here.  Has a milky white d/c and irritation.

## 2012-02-19 LAB — GC/CHLAMYDIA PROBE AMP, GENITAL: GC Probe Amp, Genital: NEGATIVE

## 2012-03-10 ENCOUNTER — Inpatient Hospital Stay (HOSPITAL_COMMUNITY)
Admission: AD | Admit: 2012-03-10 | Discharge: 2012-03-10 | Disposition: A | Discharge status: Home / Self Care | Payer: Medicaid Other | Source: Ambulatory Visit | Attending: Obstetrics | Admitting: Obstetrics

## 2012-03-10 ENCOUNTER — Encounter (HOSPITAL_COMMUNITY): Payer: Self-pay | Admitting: *Deleted

## 2012-03-10 DIAGNOSIS — N946 Dysmenorrhea, unspecified: Secondary | ICD-10-CM | POA: Insufficient documentation

## 2012-03-10 DIAGNOSIS — R109 Unspecified abdominal pain: Secondary | ICD-10-CM | POA: Insufficient documentation

## 2012-03-10 HISTORY — DX: Trichomoniasis, unspecified: A59.9

## 2012-03-10 LAB — URINALYSIS, ROUTINE W REFLEX MICROSCOPIC
Bilirubin Urine: NEGATIVE
Hgb urine dipstick: NEGATIVE
Ketones, ur: NEGATIVE mg/dL
Protein, ur: NEGATIVE mg/dL
Urobilinogen, UA: 0.2 mg/dL (ref 0.0–1.0)

## 2012-03-10 LAB — WET PREP, GENITAL
Clue Cells Wet Prep HPF POC: NONE SEEN
Trich, Wet Prep: NONE SEEN
Yeast Wet Prep HPF POC: NONE SEEN

## 2012-03-10 MED ORDER — KETOROLAC TROMETHAMINE 60 MG/2ML IM SOLN
60.0000 mg | Freq: Once | INTRAMUSCULAR | Status: AC
  Administered 2012-03-10: 60 mg via INTRAMUSCULAR
  Filled 2012-03-10: qty 2

## 2012-03-10 MED ORDER — NAPROXEN SODIUM 550 MG PO TABS: 550.0000 mg | ORAL_TABLET | Freq: Two times a day (BID) | ORAL | Status: DC

## 2012-03-10 NOTE — MAU Note (Signed)
Pt states was seen 2 weeks ago for late menstrual cycle, still has not started her cycle. Is now 1 month late. Denies abnormal vaginal discharge or bleeding, but requests "STD check".

## 2012-03-10 NOTE — MAU Provider Note (Signed)
History     CSN: [id number]  Arrival date and time: 03/10/12 1349   First Provider Initiated Contact with Patient 03/10/12 1640      Chief Complaint  Patient presents with  . Dysmenorrhea  . Abdominal Pain   HPI Teresa [name] is a 21 y.o. female who presents to MAU with abdominal cramping. The cramping started yesterday. She is concerned that she may have recurrent STI. She was evaluated here 3 weeks ago and treated for trichomonas. She told her significant other that he needed treatment and he said he was treated. She has been sexually active with him again and not sure if he is seeing someone else. She request STI testing. She has been on OC's but stopped them last week, can't remember to take them. Today scant bleeding with lower abdominal cramping. The history was provided by the patient.  OB History    Grav Para Term Preterm Abortions TAB SAB Ect Mult Living   3 1 1  0 2 1 1  0 0 1      Past Medical History  Diagnosis Date  . Asthma     childhood /grew out of it  . ADHD (attention deficit hyperactivity disorder)   . Urinary tract infection   . Depression   . Trichomonas     Past Surgical History  Procedure Date  . Wisdom tooth extraction   . Induced abortion     Family History  Problem Relation Age of Onset  . Diabetes Mother   . Hypertension Mother   . Diabetes Maternal Grandfather   . Anesthesia problems Neg Hx   . Hypotension Neg Hx   . Malignant hyperthermia Neg Hx   . Pseudochol deficiency Neg Hx     History  Substance Use Topics  . Smoking status: Current Every Day Smoker -- 0.0 packs/day    Types: Cigarettes  . Smokeless tobacco: Never Used  . Alcohol Use: 0.0 oz/week     Comment: denies use currently but was a heavy user    Allergies:  Allergies  Allergen Reactions  . Other Anaphylaxis    Grapes, strawberries, bananas, pineapple, oranges  . Tramadol Nausea And Vomiting    Prescriptions prior to admission  Medication Sig Dispense  Refill  . amoxicillin (AMOXIL) 500 MG capsule Take 500 mg by mouth 3 (three) times daily.      . Levonorgestrel-Ethinyl Estradiol (SEASONIQUE) 0.15-0.03 &0.01 MG tablet Take 1 tablet by mouth daily.  1 Package  4    Review of Systems  Constitutional: Negative for fever and chills.  Eyes: Negative for blurred vision and double vision.  Respiratory: Negative for cough and wheezing.   Cardiovascular: Negative for chest pain and palpitations.  Gastrointestinal: Positive for abdominal pain. Negative for nausea and vomiting.  Genitourinary: Negative for dysuria and frequency.  Musculoskeletal: Negative for back pain.  Skin: Negative for rash.  Neurological: Negative for dizziness, seizures and headaches.  Psychiatric/Behavioral: Positive for depression.   Blood pressure 128/81, pulse 120, temperature 98.9 F (37.2 C), temperature source Oral, resp. rate 16, height 5\' 1"  (1.549 m), weight 251 lb 8 oz (114.08 kg), last menstrual period 01/22/2012, SpO2 98.00%, not currently breastfeeding.  Physical Exam  Nursing note and vitals reviewed. Constitutional: She is oriented to [place], place, and time. She appears well-developed and well-nourished. No distress.  HENT:  Head: Normocephalic and atraumatic.  Eyes: EOM are normal.  Neck: Neck supple.  Cardiovascular: Normal rate.   Respiratory: Effort normal.  GI: Soft. There is no  tenderness.       Unable to reproduce the cramping pain that the patient has had.  Genitourinary:       External genitalia without lesions. Scant blood vaginal vault. No CMT, no adnexal tenderness. Uterus without palpable enlargement.  Musculoskeletal: Normal range of motion.  Neurological: She is alert and oriented to [place], place, and time.  Skin: Skin is warm and dry.  Psychiatric: She has a normal mood and affect. Her behavior is normal. Judgment and thought content normal.   Results for orders placed during the hospital encounter of 03/10/12 (from the past 24  hour(s))  URINALYSIS, ROUTINE W REFLEX MICROSCOPIC     Status: Normal   Collection Time   03/10/12  1:51 PM      Component Value Range   Color, Urine YELLOW  YELLOW   APPearance CLEAR  CLEAR   Specific Gravity, Urine 1.015  1.005 - 1.030   pH 6.0  5.0 - 8.0   Glucose, UA NEGATIVE  NEGATIVE mg/dL   Hgb urine dipstick NEGATIVE  NEGATIVE   Bilirubin Urine NEGATIVE  NEGATIVE   Ketones, ur NEGATIVE  NEGATIVE mg/dL   Protein, ur NEGATIVE  NEGATIVE mg/dL   Urobilinogen, UA 0.2  0.0 - 1.0 mg/dL   Nitrite NEGATIVE  NEGATIVE   Leukocytes, UA NEGATIVE  NEGATIVE  POCT PREGNANCY, URINE     Status: Normal   Collection Time   03/10/12  2:40 PM      Component Value Range   Preg Test, Ur NEGATIVE  NEGATIVE  WET PREP, GENITAL     Status: Abnormal   Collection Time   03/10/12  4:45 PM      Component Value Range   Yeast Wet Prep HPF POC NONE SEEN  NONE SEEN   Trich, Wet Prep NONE SEEN  NONE SEEN   Clue Cells Wet Prep HPF POC NONE SEEN  NONE SEEN   WBC, Wet Prep HPF POC FEW (*) NONE SEEN   Assessment: 21 y.o. female with abdominal cramping   Dysmenorrhea     Plan:  Toradol 60 mg IM   Discussed in detail with the patient stopping OC's and need for some type of birth control  Discussed with the patient and all questioned fully answered.    Medication List     As of 03/10/2012  5:33 PM    START taking these medications         naproxen sodium 550 MG tablet   Commonly known as: ANAPROX   Take 1 tablet (550 mg total) by mouth 2 (two) times daily with a meal.      CONTINUE taking these medications         Levonorgestrel-Ethinyl Estradiol 0.15-0.03 &0.01 MG tablet   Commonly known as: AMETHIA,CAMRESE   Take 1 tablet by mouth daily.          Where to get your medications    These are the prescriptions that you need to pick up. We sent them to a specific pharmacy, so you will need to go there to get them.   [facility] [address] [place], [place] - [address]    [address] [place] [address]    Phone: [phone]        naproxen sodium 550 MG tablet             MAU Course  Procedures NEESE,HOPE, RN, FNP, [facility] 03/10/2012, 4:50 PM

## 2012-03-31 ENCOUNTER — Encounter (HOSPITAL_COMMUNITY): Payer: Self-pay | Admitting: *Deleted

## 2012-03-31 ENCOUNTER — Inpatient Hospital Stay (HOSPITAL_COMMUNITY)
Admission: AD | Admit: 2012-03-31 | Discharge: 2012-03-31 | Disposition: A | Discharge status: Home / Self Care | Payer: Medicaid Other | Source: Ambulatory Visit | Attending: Obstetrics | Admitting: Obstetrics

## 2012-03-31 DIAGNOSIS — Z711 Person with feared health complaint in whom no diagnosis is made: Secondary | ICD-10-CM

## 2012-03-31 DIAGNOSIS — N912 Amenorrhea, unspecified: Secondary | ICD-10-CM | POA: Insufficient documentation

## 2012-03-31 DIAGNOSIS — N926 Irregular menstruation, unspecified: Secondary | ICD-10-CM | POA: Insufficient documentation

## 2012-03-31 LAB — URINALYSIS, ROUTINE W REFLEX MICROSCOPIC
Bilirubin Urine: NEGATIVE
Hgb urine dipstick: NEGATIVE
Specific Gravity, Urine: 1.025 (ref 1.005–1.030)
pH: 6 (ref 5.0–8.0)

## 2012-03-31 LAB — WET PREP, GENITAL
Clue Cells Wet Prep HPF POC: NONE SEEN
Trich, Wet Prep: NONE SEEN
Yeast Wet Prep HPF POC: NONE SEEN

## 2012-03-31 NOTE — MAU Provider Note (Signed)
CC: Possible Pregnancy and Amenorrhea    First Provider Initiated Contact with Patient 03/31/12 1321      HPI Teresa [name] is a 21 y.o. [id number] who presents with amenorrhea x 3 months (SVD 4 months ago; bottle feeding), not on birth control and wants STD check. States menses have always been irregular with long cycles. Was amenorrheic x 1 yr age 68 and x6 months prior to her recent pregnancy. In addition she has longstanding constant pain which she attributes to her ovaries. Pain is sharp or crampy at times and does not radiate. Not re; BM or voiding. Denes constipation or dysuria, urgency, frequency. States she cannot return to [place] due to loss of medicaid.   Past Medical History  Diagnosis Date  . Asthma     childhood /grew out of it  . ADHD (attention deficit hyperactivity disorder)   . Urinary tract infection   . Depression   . Trichomonas     OB History    Grav Para Term Preterm Abortions TAB SAB Ect Mult Living   3 1 1  0 2 1 1  0 0 1     # Outc Date GA Lbr Len/2nd Wgt Sex Del Anes PTL Lv   1 TRM 7/13 [redacted]w[redacted]d 702:35 / 01:55 8lb5.7oz(3.79kg) F SVD EPI  Yes   2 SAB            3 TAB               Past Surgical History  Procedure Date  . Wisdom tooth extraction   . Induced abortion     History   Social History  . Marital Status: Single    Spouse Name: N/A    Number of Children: N/A  . Years of Education: N/A   Occupational History  . Not on file.   Social History Main Topics  . Smoking status: Current Every Day Smoker -- 0.0 packs/day    Types: Cigarettes  . Smokeless tobacco: Never Used  . Alcohol Use: 0.0 oz/week     Comment: denies use currently but was a heavy user  . Drug Use: No  . Sexually Active: Yes    Birth Control/ Protection: None   Other Topics Concern  . Not on file   Social History Narrative  . No narrative on file    No current facility-administered medications on file prior to encounter.   Current Outpatient Prescriptions on File  Prior to Encounter  Medication Sig Dispense Refill  . Levonorgestrel-Ethinyl Estradiol (SEASONIQUE) 0.15-0.03 &0.01 MG tablet Take 1 tablet by mouth daily.  1 Package  4  . naproxen sodium (ANAPROX DS) 550 MG tablet Take 1 tablet (550 mg total) by mouth 2 (two) times daily with a meal.  15 tablet  0    Allergies  Allergen Reactions  . Other Anaphylaxis    Grapes, strawberries, bananas, pineapple, oranges  . Tramadol Nausea And Vomiting    ROS Pertinent items in HPI  PHYSICAL EXAM Filed Vitals:   03/31/12 1152  BP: 132/76  Pulse: 80  Temp: 98.5 F (36.9 C)  Resp: 20   General: Well nourished, well developed female in no acute distress Cardiovascular: Normal rate Respiratory: Normal effort Abdomen: Soft, nontender Back: No CVAT Extremities: No edema Neurologic: Alert and oriented Speculum exam: NEFG; vagina with physiologic discharge, no blood; cervix clean Bimanual exam: cervix closed, no CMT; uterus NSSP; no adnexal tenderness or masses  LAB RESULTS Results for orders placed during the hospital encounter of 03/31/12 (  from the past 24 hour(s))  URINALYSIS, ROUTINE W REFLEX MICROSCOPIC     Status: Normal   Collection Time   03/31/12 12:14 PM      Component Value Range   Color, Urine YELLOW  YELLOW   APPearance CLEAR  CLEAR   Specific Gravity, Urine 1.025  1.005 - 1.030   pH 6.0  5.0 - 8.0   Glucose, UA NEGATIVE  NEGATIVE mg/dL   Hgb urine dipstick NEGATIVE  NEGATIVE   Bilirubin Urine NEGATIVE  NEGATIVE   Ketones, ur NEGATIVE  NEGATIVE mg/dL   Protein, ur NEGATIVE  NEGATIVE mg/dL   Urobilinogen, UA 0.2  0.0 - 1.0 mg/dL   Nitrite NEGATIVE  NEGATIVE   Leukocytes, UA NEGATIVE  NEGATIVE  POCT PREGNANCY, URINE     Status: Normal   Collection Time   03/31/12 12:49 PM      Component Value Range   Preg Test, Ur NEGATIVE  NEGATIVE  WET PREP, GENITAL     Status: Abnormal   Collection Time   03/31/12  1:28 PM      Component Value Range   Yeast Wet Prep HPF POC NONE SEEN   NONE SEEN   Trich, Wet Prep NONE SEEN  NONE SEEN   Clue Cells Wet Prep HPF POC NONE SEEN  NONE SEEN   WBC, Wet Prep HPF POC MODERATE (*) NONE SEEN    IMAGING No results found.  MAU COURSE GC/CT sent ASSESSMENT  1. Irregular menses   2. Concern about STD in female without diagnosis     PLAN Discharge home. See AVS for patient education. Referred to [facility] for full STD testing Follow-up Information    Follow up with [facility]. (Someone from GYN clinic will call with an appointment)           Medication List     As of 03/31/2012  2:01 PM         Deirdre Colin Mulders, CNM 03/31/2012 1:30 PM

## 2012-03-31 NOTE — Progress Notes (Signed)
Patient not in room. Staff did not see patient leave. Was advised after exam to get dressed and wait for test results and D/C papers.

## 2012-03-31 NOTE — MAU Note (Signed)
No period in 3 months.   Has been having unprotected sex, wants to know if she is pregnant or what is going on.  abd pain in lower abd. Had a knot at top of stomach???

## 2012-03-31 NOTE — Progress Notes (Signed)
Patient was in room #5 with her friend. They came to MAU together.

## 2012-03-31 NOTE — Progress Notes (Signed)
Patient not in room

## 2012-04-01 LAB — GC/CHLAMYDIA PROBE AMP
CT Probe RNA: NEGATIVE
GC Probe RNA: NEGATIVE

## 2012-04-03 ENCOUNTER — Emergency Department (HOSPITAL_COMMUNITY)
Admission: EM | Admit: 2012-04-03 | Discharge: 2012-04-03 | Discharge status: Against Medical Advice | Payer: Medicaid Other

## 2012-04-03 NOTE — ED Notes (Signed)
Did not answer for triage x2

## 2012-04-03 NOTE — ED Notes (Signed)
Called for triage x2

## 2012-04-15 ENCOUNTER — Encounter: Payer: Self-pay | Admitting: Advanced Practice Midwife

## 2012-04-15 ENCOUNTER — Ambulatory Visit (INDEPENDENT_AMBULATORY_CARE_PROVIDER_SITE_OTHER): Payer: Medicaid Other | Admitting: Advanced Practice Midwife

## 2012-04-15 VITALS — BP 118/76 | HR 80 | Temp 97.1°F | Resp 20 | Ht 61.5 in | Wt 267.0 lb

## 2012-04-15 DIAGNOSIS — N946 Dysmenorrhea, unspecified: Secondary | ICD-10-CM | POA: Insufficient documentation

## 2012-04-15 DIAGNOSIS — Z23 Encounter for immunization: Secondary | ICD-10-CM

## 2012-04-15 DIAGNOSIS — Z113 Encounter for screening for infections with a predominantly sexual mode of transmission: Secondary | ICD-10-CM

## 2012-04-15 HISTORY — DX: Dysmenorrhea, unspecified: N94.6

## 2012-04-15 MED ORDER — IBUPROFEN 200 MG PO TABS
600.0000 mg | ORAL_TABLET | Freq: Once | ORAL | Status: AC
  Administered 2012-04-15: 600 mg via ORAL

## 2012-04-15 MED ORDER — NAPROXEN SODIUM 550 MG PO TABS: 550.0000 mg | ORAL_TABLET | Freq: Two times a day (BID) | ORAL | Status: DC

## 2012-04-15 MED ORDER — INFLUENZA VIRUS VACC SPLIT PF IM SUSP: 0.5000 mL | Freq: Once | INTRAMUSCULAR | Status: DC

## 2012-04-15 MED ORDER — ETONOGESTREL-ETHINYL ESTRADIOL 0.12-0.015 MG/24HR VA RING: VAGINAL_RING | VAGINAL | Status: DC

## 2012-04-15 NOTE — Progress Notes (Signed)
  Subjective:    Patient ID: Teresa [name], female    DOB: Jun 30, 1990, 21 y.o.   MRN: [id number]  HPI 21 y.o. G3P1021 at 4 months postpartum with low abd pain, bilateral in "ovaries", then started period, now just cramping pain with period. Patient's last menstrual period was 04/13/2012. First period since delivery. Requested STD testing, GC/CT negative 2 weeks ago. Requests pap - turning 21 tomorrow. Had pelvic exam in [facility] 2 weeks ago with negative GC/CT. Also requests birth control.   Review of Systems  Constitutional: Negative for fever, chills and fatigue.  Respiratory: Negative.   Cardiovascular: Negative.   Gastrointestinal: Negative for nausea, vomiting, abdominal pain, diarrhea and constipation.  Genitourinary: Positive for vaginal bleeding, vaginal pain and pelvic pain. Negative for dysuria, urgency, frequency, hematuria, flank pain, vaginal discharge, difficulty urinating, genital sores, menstrual problem and dyspareunia.  Musculoskeletal: Negative.   Neurological: Negative.   Psychiatric/Behavioral: Negative.        Objective:   Physical Exam  Nursing note and vitals reviewed. Constitutional: She is oriented to [place], place, and time. She appears well-developed and well-nourished. No distress.  Cardiovascular: Normal rate.   Pulmonary/Chest: Effort normal.  Abdominal: Soft. There is no tenderness.  Genitourinary:       Deferred - recent exam in [facility]   Musculoskeletal: Normal range of motion.  Neurological: She is alert and oriented to [place], place, and time.  Skin: Skin is warm and dry.  Psychiatric: She has a normal mood and affect.          Assessment & Plan:  1) Dysmenorrhea - discussed that first period following childbirth is often heavier/more painful than usual, follow up if problems continue - rx Anaprox for cramping 2) Undesired fertility - after discussion of options, pt decides on Nuva Ring. Rx sent, precautions and instructions rev'd.  3) High risk  sexual behavior/STI screening -  Orders Placed This Encounter  Procedures  . HIV antibody  . RPR  . Hepatitis B Surface AntiGEN   4) Due for pap at 21 yrs old - scheduled annual in 1 month, consider repeating GC/CT at that time

## 2012-04-15 NOTE — Patient Instructions (Signed)
Safer Sex Your caregiver wants you to have this information about the infections that can be transmitted from sexual contact and how to prevent them. The idea behind safer sex is that you can be sexually active, and at the same time reduce the risk of giving or getting a sexually transmitted disease (STD). Every [place] should be aware of how to prevent him or herself and his or her sex partner from getting an STD. CAUSES OF STDS STDs are transmitted by sharing body fluids, which contain viruses and bacteria. The following fluids all transmit infections during sexual intercourse and sex acts:  Semen.   Saliva.   Urine.   Blood.   Vaginal mucus.  Examples of STDs include:  Chlamydia.   Gonorrhea.   Genital herpes.   Hepatitis B.   Human immunodeficiency virus or acquired immunodeficiency syndrome (HIV or AIDS).   Syphilis.   Trichomonas.   Pubic lice.   Human papillomavirus (HPV), which may include:   Genital warts.   Cervical dysplasia.   Cervical cancer (can develop with certain types of HPV).  SYMPTOMS   Sexual diseases often cause few or no symptoms until they are advanced, so a [place] can be infected and spread the infection without knowing it. Some STDs respond to treatment very well. Others, like HIV and herpes, cannot be cured, but are treated to reduce their effects. Specific symptoms include:  Abnormal vaginal discharge.   Irritation or itching in and around the vagina, and in the pubic hair.   Pain during sexual intercourse.   Bleeding during sexual intercourse.   Pelvic or abdominal pain.   Fever.   Growths in and around the vagina.   An ulcer in or around the vagina.   Swollen glands in the groin area.  DIAGNOSIS    Blood tests.   Pap test.   Culture test of abnormal vaginal discharge.   A test that applies a solution and examines the cervix with a lighted magnifying scope (colposcopy).   A test that examines the pelvis with a lighted  tube, through a small incision (laparoscopy).  TREATMENT   The treatment will depend on the cause of the STD.  Antibiotic treatment by injection, oral, creams, or suppositories in the vagina.   Over-the-counter medicated shampoo, to get rid of pubic lice.   Removing or treating growths with medicine, freezing, burning (electrocautery), or surgery.   Surgery treatment for HPV of the cervix.   Supportive medicines for herpes, HIV, AIDS, and hepatitis.  Being careful cannot eliminate all risk of infection, but sex can be made much safer. Safe sexual practices include body massage and gentle touching. Masturbation is safe, as long as body fluids do not contact skin that has sores or cuts. Dry kissing and oral sex on a man wearing a latex condom or on a woman wearing a female condom is also safe. Slightly less safe is intercourse while the man wears a latex condom or wet kissing. It is also safer to have one sex partner that you know is not having sex with anyone else. LENGTH OF ILLNESS An STD might be treated and cured in a week, sometimes a month, or more. And it can linger with symptoms for many years. STDs can also cause damage to the female organs. This can cause chronic pain, infertility, and recurrence of the STD, especially herpes, hepatitis, HIV, and HPV. HOME CARE INSTRUCTIONS AND PREVENTION  Alcohol and recreational drugs are often the reason given for not practicing safer sex.  These substances affect your judgment. Alcohol and recreational drugs can also impair your immune system, making you more vulnerable to disease.   Do not engage in risky and dangerous sexual practices, including:   Vaginal or anal sex without a condom.   Oral sex on a man without a condom.   Oral sex on a woman without a female condom.   Using saliva to lubricate a condom.   Any other sexual contact in which body fluids or blood from one partner contact the other partner.   You should use only latex  condoms for men and water soluble lubricants. Petroleum based lubricants or oils used to lubricate a condom will weaken the condom and increase the chance that it will break.   Think very carefully before having sex with anyone who is high risk for STDs and HIV. This includes IV drug users, people with multiple sexual partners, or people who have had an STD, or a positive hepatitis or HIV blood test.   Remember that even if your partner has had only one previous partner, their previous partner might have had multiple partners. If so, you are at high risk of being exposed to an STD. You and your sex partner should be the only sex partners with each other, with no one else involved.   A vaccine is available for hepatitis B and HPV through your caregiver or the Public Health Department. Everyone should be vaccinated with these vaccines.   Avoid risky sex practices. Sex acts that can break the skin make you more likely to get an STD.  SEEK MEDICAL CARE IF:    If you think you have an STD, even if you do not have any symptoms. Contact your caregiver for evaluation and treatment, if needed.   You think or know your sex partner has acquired an STD.   You have any of the symptoms mentioned above.  Document Released: 05/23/2004 Document Revised: 07/08/2011 Document Reviewed: 03/15/2009 [facility] Patient Information 2013 [place], [place].   Safer Sex Your caregiver wants you to have this information about the infections that can be transmitted from sexual contact and how to prevent them. The idea behind safer sex is that you can be sexually active, and at the same time reduce the risk of giving or getting a sexually transmitted disease (STD). Every [place] should be aware of how to prevent him or herself and his or her sex partner from getting an STD. CAUSES OF STDS STDs are transmitted by sharing body fluids, which contain viruses and bacteria. The following fluids all transmit infections during sexual  intercourse and sex acts:  Semen.   Saliva.   Urine.   Blood.   Vaginal mucus.  Examples of STDs include:  Chlamydia.   Gonorrhea.   Genital herpes.   Hepatitis B.   Human immunodeficiency virus or acquired immunodeficiency syndrome (HIV or AIDS).   Syphilis.   Trichomonas.   Pubic lice.   Human papillomavirus (HPV), which may include:   Genital warts.   Cervical dysplasia.   Cervical cancer (can develop with certain types of HPV).  SYMPTOMS   Sexual diseases often cause few or no symptoms until they are advanced, so a Osei can be infected and spread the infection without knowing it. Some STDs respond to treatment very well. Others, like HIV and herpes, cannot be cured, but are treated to reduce their effects. Specific symptoms include:  Abnormal vaginal discharge.   Irritation or itching in and around the vagina, and in  the pubic hair.   Pain during sexual intercourse.   Bleeding during sexual intercourse.   Pelvic or abdominal pain.   Fever.   Growths in and around the vagina.   An ulcer in or around the vagina.   Swollen glands in the groin area.  DIAGNOSIS    Blood tests.   Pap test.   Culture test of abnormal vaginal discharge.   A test that applies a solution and examines the cervix with a lighted magnifying scope (colposcopy).   A test that examines the pelvis with a lighted tube, through a small incision (laparoscopy).  TREATMENT   The treatment will depend on the cause of the STD.  Antibiotic treatment by injection, oral, creams, or suppositories in the vagina.   Over-the-counter medicated shampoo, to get rid of pubic lice.   Removing or treating growths with medicine, freezing, burning (electrocautery), or surgery.   Surgery treatment for HPV of the cervix.   Supportive medicines for herpes, HIV, AIDS, and hepatitis.  Being careful cannot eliminate all risk of infection, but sex can be made much safer. Safe sexual practices  include body massage and gentle touching. Masturbation is safe, as long as body fluids do not contact skin that has sores or cuts. Dry kissing and oral sex on a man wearing a latex condom or on a woman wearing a female condom is also safe. Slightly less safe is intercourse while the man wears a latex condom or wet kissing. It is also safer to have one sex partner that you know is not having sex with anyone else. LENGTH OF ILLNESS An STD might be treated and cured in a week, sometimes a month, or more. And it can linger with symptoms for many years. STDs can also cause damage to the female organs. This can cause chronic pain, infertility, and recurrence of the STD, especially herpes, hepatitis, HIV, and HPV. HOME CARE INSTRUCTIONS AND PREVENTION  Alcohol and recreational drugs are often the reason given for not practicing safer sex. These substances affect your judgment. Alcohol and recreational drugs can also impair your immune system, making you more vulnerable to disease.   Do not engage in risky and dangerous sexual practices, including:   Vaginal or anal sex without a condom.   Oral sex on a man without a condom.   Oral sex on a woman without a female condom.   Using saliva to lubricate a condom.   Any other sexual contact in which body fluids or blood from one partner contact the other partner.   You should use only latex condoms for men and water soluble lubricants. Petroleum based lubricants or oils used to lubricate a condom will weaken the condom and increase the chance that it will break.   Think very carefully before having sex with anyone who is high risk for STDs and HIV. This includes IV drug users, people with multiple sexual partners, or people who have had an STD, or a positive hepatitis or HIV blood test.   Remember that even if your partner has had only one previous partner, their previous partner might have had multiple partners. If so, you are at high risk of being exposed  to an STD. You and your sex partner should be the only sex partners with each other, with no one else involved.   A vaccine is available for hepatitis B and HPV through your caregiver or the Public Health Department. Everyone should be vaccinated with these vaccines.   Avoid risky sex  practices. Sex acts that can break the skin make you more likely to get an STD.  SEEK MEDICAL CARE IF:    If you think you have an STD, even if you do not have any symptoms. Contact your caregiver for evaluation and treatment, if needed.   You think or know your sex partner has acquired an STD.   You have any of the symptoms mentioned above.  Document Released: 05/23/2004 Document Revised: 07/08/2011 Document Reviewed: 03/15/2009 [facility] Patient Information 2013 [place], [place].   Safer Sex Your caregiver wants you to have this information about the infections that can be transmitted from sexual contact and how to prevent them. The idea behind safer sex is that you can be sexually active, and at the same time reduce the risk of giving or getting a sexually transmitted disease (STD). Every [place] should be aware of how to prevent him or herself and his or her sex partner from getting an STD. CAUSES OF STDS STDs are transmitted by sharing body fluids, which contain viruses and bacteria. The following fluids all transmit infections during sexual intercourse and sex acts:  Semen.   Saliva.   Urine.   Blood.   Vaginal mucus.  Examples of STDs include:  Chlamydia.   Gonorrhea.   Genital herpes.   Hepatitis B.   Human immunodeficiency virus or acquired immunodeficiency syndrome (HIV or AIDS).   Syphilis.   Trichomonas.   Pubic lice.   Human papillomavirus (HPV), which may include:   Genital warts.   Cervical dysplasia.   Cervical cancer (can develop with certain types of HPV).  SYMPTOMS   Sexual diseases often cause few or no symptoms until they are advanced, so a [place]att can be infected  and spread the infection without knowing it. Some STDs respond to treatment very well. Others, like HIV and herpes, cannot be cured, but are treated to reduce their effects. Specific symptoms include:  Abnormal vaginal discharge.   Irritation or itching in and around the vagina, and in the pubic hair.   Pain during sexual intercourse.   Bleeding during sexual intercourse.   Pelvic or abdominal pain.   Fever.   Growths in and around the vagina.   An ulcer in or around the vagina.   Swollen glands in the groin area.  DIAGNOSIS    Blood tests.   Pap test.   Culture test of abnormal vaginal discharge.   A test that applies a solution and examines the cervix with a lighted magnifying scope (colposcopy).   A test that examines the pelvis with a lighted tube, through a small incision (laparoscopy).  TREATMENT   The treatment will depend on the cause of the STD.  Antibiotic treatment by injection, oral, creams, or suppositories in the vagina.   Over-the-counter medicated shampoo, to get rid of pubic lice.   Removing or treating growths with medicine, freezing, burning (electrocautery), or surgery.   Surgery treatment for HPV of the cervix.   Supportive medicines for herpes, HIV, AIDS, and hepatitis.  Being careful cannot eliminate all risk of infection, but sex can be made much safer. Safe sexual practices include body massage and gentle touching. Masturbation is safe, as long as body fluids do not contact skin that has sores or cuts. Dry kissing and oral sex on a man wearing a latex condom or on a woman wearing a female condom is also safe. Slightly less safe is intercourse while the man wears a latex condom or wet kissing. It is also  safer to have one sex partner that you know is not having sex with anyone else. LENGTH OF ILLNESS An STD might be treated and cured in a week, sometimes a month, or more. And it can linger with symptoms for many years. STDs can also cause damage  to the female organs. This can cause chronic pain, infertility, and recurrence of the STD, especially herpes, hepatitis, HIV, and HPV. HOME CARE INSTRUCTIONS AND PREVENTION  Alcohol and recreational drugs are often the reason given for not practicing safer sex. These substances affect your judgment. Alcohol and recreational drugs can also impair your immune system, making you more vulnerable to disease.   Do not engage in risky and dangerous sexual practices, including:   Vaginal or anal sex without a condom.   Oral sex on a man without a condom.   Oral sex on a woman without a female condom.   Using saliva to lubricate a condom.   Any other sexual contact in which body fluids or blood from one partner contact the other partner.   You should use only latex condoms for men and water soluble lubricants. Petroleum based lubricants or oils used to lubricate a condom will weaken the condom and increase the chance that it will break.   Think very carefully before having sex with anyone who is high risk for STDs and HIV. This includes IV drug users, people with multiple sexual partners, or people who have had an STD, or a positive hepatitis or HIV blood test.   Remember that even if your partner has had only one previous partner, their previous partner might have had multiple partners. If so, you are at high risk of being exposed to an STD. You and your sex partner should be the only sex partners with each other, with no one else involved.   A vaccine is available for hepatitis B and HPV through your caregiver or the Public Health Department. Everyone should be vaccinated with these vaccines.   Avoid risky sex practices. Sex acts that can break the skin make you more likely to get an STD.  SEEK MEDICAL CARE IF:    If you think you have an STD, even if you do not have any symptoms. Contact your caregiver for evaluation and treatment, if needed.   You think or know your sex partner has acquired  an STD.   You have any of the symptoms mentioned above.  Document Released: 05/23/2004 Document Revised: 07/08/2011 Document Reviewed: 03/15/2009 [facility] Patient Information 2013 [place], [place].

## 2012-04-22 ENCOUNTER — Encounter (HOSPITAL_COMMUNITY): Payer: Self-pay | Admitting: *Deleted

## 2012-04-22 ENCOUNTER — Emergency Department (HOSPITAL_COMMUNITY)
Admission: EM | Admit: 2012-04-22 | Discharge: 2012-04-22 | Disposition: A | Discharge status: Home / Self Care | Payer: Medicaid Other | Attending: Emergency Medicine | Admitting: Emergency Medicine

## 2012-04-22 DIAGNOSIS — Z8709 Personal history of other diseases of the respiratory system: Secondary | ICD-10-CM | POA: Insufficient documentation

## 2012-04-22 DIAGNOSIS — K529 Noninfective gastroenteritis and colitis, unspecified: Secondary | ICD-10-CM

## 2012-04-22 DIAGNOSIS — Z79899 Other long term (current) drug therapy: Secondary | ICD-10-CM | POA: Insufficient documentation

## 2012-04-22 DIAGNOSIS — Z8659 Personal history of other mental and behavioral disorders: Secondary | ICD-10-CM | POA: Insufficient documentation

## 2012-04-22 DIAGNOSIS — K5289 Other specified noninfective gastroenteritis and colitis: Secondary | ICD-10-CM | POA: Insufficient documentation

## 2012-04-22 DIAGNOSIS — Z87440 Personal history of urinary (tract) infections: Secondary | ICD-10-CM | POA: Insufficient documentation

## 2012-04-22 DIAGNOSIS — F17200 Nicotine dependence, unspecified, uncomplicated: Secondary | ICD-10-CM | POA: Insufficient documentation

## 2012-04-22 DIAGNOSIS — Z8619 Personal history of other infectious and parasitic diseases: Secondary | ICD-10-CM | POA: Insufficient documentation

## 2012-04-22 DIAGNOSIS — R1110 Vomiting, unspecified: Secondary | ICD-10-CM | POA: Insufficient documentation

## 2012-04-22 LAB — URINALYSIS, ROUTINE W REFLEX MICROSCOPIC
Bilirubin Urine: NEGATIVE
Glucose, UA: NEGATIVE mg/dL
Hgb urine dipstick: NEGATIVE
Nitrite: NEGATIVE
Specific Gravity, Urine: >1.03 — ABNORMAL HIGH (ref 1.005–1.030)
pH: 6 (ref 5.0–8.0)

## 2012-04-22 LAB — COMPREHENSIVE METABOLIC PANEL
ALT: 18 U/L (ref 0–35)
Alkaline Phosphatase: 100 U/L (ref 39–117)
CO2: 23 mEq/L (ref 19–32)
Calcium: 9.2 mg/dL (ref 8.4–10.5)
GFR calc Af Amer: >90 mL/min (ref >90)
GFR calc non Af Amer: >90 mL/min (ref >90)
Glucose, Bld: 100 mg/dL — ABNORMAL HIGH (ref 70–99)
Sodium: 138 mEq/L (ref 135–145)

## 2012-04-22 LAB — CBC WITH DIFFERENTIAL/PLATELET
Eosinophils Relative: 2 % (ref 0–5)
HCT: 40.9 % (ref 36.0–46.0)
Lymphocytes Relative: 10 % — ABNORMAL LOW (ref 12–46)
Lymphs Abs: 0.9 10*3/uL (ref 0.7–4.0)
MCV: 84.9 fL (ref 78.0–100.0)
Platelets: 279 10*3/uL (ref 150–400)
RBC: 4.82 MIL/uL (ref 3.87–5.11)
WBC: 9.4 10*3/uL (ref 4.0–10.5)

## 2012-04-22 MED ORDER — ONDANSETRON HCL 4 MG PO TABS: 4.0000 mg | ORAL_TABLET | Freq: Four times a day (QID) | ORAL | Status: DC

## 2012-04-22 MED ORDER — DICYCLOMINE HCL 20 MG PO TABS: 20.0000 mg | ORAL_TABLET | Freq: Two times a day (BID) | ORAL | Status: DC

## 2012-04-22 MED ORDER — BENZONATATE 100 MG PO CAPS: 100.0000 mg | ORAL_CAPSULE | Freq: Three times a day (TID) | ORAL | Status: DC

## 2012-04-22 MED ORDER — DICYCLOMINE HCL 10 MG/ML IM SOLN
20.0000 mg | Freq: Once | INTRAMUSCULAR | Status: AC
  Administered 2012-04-22: 20 mg via INTRAMUSCULAR
  Filled 2012-04-22: qty 2

## 2012-04-22 MED ORDER — SODIUM CHLORIDE 0.9 % IV BOLUS (SEPSIS)
1000.0000 mL | Freq: Once | INTRAVENOUS | Status: AC
  Administered 2012-04-22: 1000 mL via INTRAVENOUS

## 2012-04-22 NOTE — ED Notes (Signed)
abd pain with nv and diarrhea all day.  Her stools are green lmp this month

## 2012-04-22 NOTE — ED Notes (Signed)
Pt completed beverage/ginger ale/eating ice; no nausea/vomiting reported by patient

## 2012-04-22 NOTE — ED Notes (Addendum)
Drink given to assess nausea/vomiting

## 2012-04-22 NOTE — ED Provider Notes (Signed)
History     CSN: [id number]  Arrival date & time 04/22/12  0120   First MD Initiated Contact with Patient 04/22/12 [phone]      Chief Complaint  Patient presents with  . green bms     (Consider location/radiation/quality/duration/timing/severity/associated sxs/prior treatment) HPI Pt with diffuse abd cramping, loose stools x 4 and vomiting x 4 starting yesterday. No fever or chills. Pt has persistent non-productive cough. No sick contacts. Pt interrupts exam multiple time to ask for ginger ale.  Past Medical History  Diagnosis Date  . Asthma     childhood /grew out of it  . ADHD (attention deficit hyperactivity disorder)   . Urinary tract infection   . Depression   . Trichomonas     Past Surgical History  Procedure Date  . Wisdom tooth extraction   . Induced abortion     Family History  Problem Relation Age of Onset  . Diabetes Mother   . Hypertension Mother   . Diabetes Maternal Grandfather   . Anesthesia problems Neg Hx   . Hypotension Neg Hx   . Malignant hyperthermia Neg Hx   . Pseudochol deficiency Neg Hx     History  Substance Use Topics  . Smoking status: Current Every Day Smoker -- 0.2 packs/day    Types: Cigarettes  . Smokeless tobacco: Never Used  . Alcohol Use: 0.0 oz/week     Comment: denies use currently but was a heavy user    OB History    Grav Para Term Preterm Abortions TAB SAB Ect Mult Living   3 1 1  0 2 1 1  0 0 1      Review of Systems  Constitutional: Negative for fever and chills.  Respiratory: Positive for cough. Negative for shortness of breath and wheezing.   Cardiovascular: Negative for chest pain.  Gastrointestinal: Positive for abdominal pain. Negative for nausea, vomiting, diarrhea and constipation.  Genitourinary: Negative for dysuria and flank pain.  Musculoskeletal: Negative for myalgias and back pain.  Skin: Negative for rash and wound.  Neurological: Negative for dizziness, weakness, light-headedness, numbness and  headaches.    Allergies  Other and Tramadol  Home Medications   Current Outpatient Rx  Name  Route  Sig  Dispense  Refill  . HYDROCODONE-ACETAMINOPHEN 5-500 MG PO TABS   Oral   Take 1 tablet by mouth every 6 (six) hours as needed. For pain         . OMEPRAZOLE 20 MG PO CPDR   Oral   Take 20 mg by mouth daily.         Marland Kitchen BENZONATATE 100 MG PO CAPS   Oral   Take 1 capsule (100 mg total) by mouth every 8 (eight) hours.   21 capsule   0   . DICYCLOMINE HCL 20 MG PO TABS   Oral   Take 1 tablet (20 mg total) by mouth 2 (two) times daily.   20 tablet   0   . ETONOGESTREL-ETHINYL ESTRADIOL 0.12-0.015 MG/24HR VA RING      Insert vaginally and leave in place for 3 consecutive weeks, then remove for 1 week.   1 each   12   . NAPROXEN SODIUM 550 MG PO TABS   Oral   Take 1 tablet (550 mg total) by mouth 2 (two) times daily with a meal.   60 tablet   1   . ONDANSETRON HCL 4 MG PO TABS   Oral   Take 1 tablet (4 mg total)  by mouth every 6 (six) hours.   12 tablet   0     BP 104/50  Pulse 92  Temp 99.3 F (37.4 C) (Oral)  Resp 18  SpO2 94%  LMP 04/13/2012  Physical Exam  Nursing note and vitals reviewed. Constitutional: She is oriented to [place], place, and time. She appears well-developed and well-nourished. No distress.  HENT:  Head: Normocephalic and atraumatic.  Mouth/Throat: Oropharynx is clear and moist.  Eyes: EOM are normal. Pupils are equal, round, and reactive to light.  Neck: Normal range of motion. Neck supple.  Cardiovascular: Normal rate and regular rhythm.   Pulmonary/Chest: Effort normal and breath sounds normal. No respiratory distress. She has no wheezes. She has no rales. She exhibits no tenderness.  Abdominal: Soft. Bowel sounds are normal. She exhibits no distension and no mass. There is tenderness (diffuse non-focal TTP without rebound or guarding. ). There is no rebound and no guarding.  Musculoskeletal: Normal range of motion. She  exhibits no edema and no tenderness.  Neurological: She is alert and oriented to [place], place, and time.  Skin: Skin is warm and dry. No rash noted. No erythema.  Psychiatric: She has a normal mood and affect. Her behavior is normal.    ED Course  Procedures (including critical care time)  Labs Reviewed  URINALYSIS, ROUTINE W REFLEX MICROSCOPIC - Abnormal; Notable for the following:    APPearance CLOUDY (*)     Specific Gravity, Urine >1.030 (*)     Protein, ur 30 (*)     Leukocytes, UA SMALL (*)     All other components within normal limits  CBC WITH DIFFERENTIAL - Abnormal; Notable for the following:    Neutrophils Relative 85 (*)     Neutro Abs 7.9 (*)     Lymphocytes Relative 10 (*)     All other components within normal limits  COMPREHENSIVE METABOLIC PANEL - Abnormal; Notable for the following:    Glucose, Bld 100 (*)     All other components within normal limits  URINE MICROSCOPIC-ADD ON - Abnormal; Notable for the following:    Squamous Epithelial / LPF FEW (*)     All other components within normal limits  PREGNANCY, URINE   No results found.   1. Gastroenteritis       MDM  Pt is well appearing. Likely viral illness. Will give IVF's and PO trial.    Pt tolerating PO's well. Cramping subsided with bentyl. Return for worsening pain or concerns     Loren Racer, MD 04/22/12 [phone]

## 2012-05-13 ENCOUNTER — Ambulatory Visit: Payer: Medicaid Other | Admitting: Obstetrics & Gynecology

## 2012-05-29 ENCOUNTER — Ambulatory Visit (INDEPENDENT_AMBULATORY_CARE_PROVIDER_SITE_OTHER): Payer: Medicaid Other | Admitting: Obstetrics & Gynecology

## 2012-05-29 ENCOUNTER — Encounter: Payer: Self-pay | Admitting: Obstetrics & Gynecology

## 2012-05-29 ENCOUNTER — Other Ambulatory Visit (HOSPITAL_COMMUNITY)
Admission: RE | Admit: 2012-05-29 | Discharge: 2012-05-29 | Disposition: A | Discharge status: Home / Self Care | Payer: Medicaid Other | Source: Ambulatory Visit | Attending: Obstetrics & Gynecology | Admitting: Obstetrics & Gynecology

## 2012-05-29 VITALS — BP 139/96 | HR 102 | Temp 97.4°F | Ht 61.0 in | Wt 261.4 lb

## 2012-05-29 DIAGNOSIS — Z113 Encounter for screening for infections with a predominantly sexual mode of transmission: Secondary | ICD-10-CM | POA: Insufficient documentation

## 2012-05-29 DIAGNOSIS — Z3202 Encounter for pregnancy test, result negative: Secondary | ICD-10-CM

## 2012-05-29 DIAGNOSIS — Z01419 Encounter for gynecological examination (general) (routine) without abnormal findings: Secondary | ICD-10-CM | POA: Insufficient documentation

## 2012-05-29 DIAGNOSIS — N760 Acute vaginitis: Secondary | ICD-10-CM | POA: Insufficient documentation

## 2012-05-29 DIAGNOSIS — Z0000 Encounter for general adult medical examination without abnormal findings: Secondary | ICD-10-CM

## 2012-05-29 LAB — POCT URINALYSIS DIP (DEVICE)
Ketones, ur: NEGATIVE mg/dL
Leukocytes, UA: NEGATIVE
Nitrite: NEGATIVE
Protein, ur: 30 mg/dL — AB
Urobilinogen, UA: 0.2 mg/dL (ref 0.0–1.0)
pH: 5.5 (ref 5.0–8.0)

## 2012-05-29 MED ORDER — MELOXICAM 15 MG PO TABS: 15.0000 mg | ORAL_TABLET | Freq: Every day | ORAL | Status: DC

## 2012-05-29 NOTE — Patient Instructions (Addendum)
Levonorgestrel intrauterine device (IUD) What is this medicine? LEVONORGESTREL IUD (LEE voe nor jes trel) is a contraceptive (birth control) device. The device is placed inside the uterus by a healthcare professional. It is used to prevent pregnancy and can also be used to treat heavy bleeding that occurs during your period. Depending on the device, it can be used for 3 to 5 years. This medicine may be used for other purposes; ask your health care provider or pharmacist if you have questions. What should I tell my health care provider before I take this medicine? They need to know if you have any of these conditions: -abnormal Pap smear -cancer of the breast, uterus, or cervix -diabetes -endometritis -genital or pelvic infection now or in the past -have more than one sexual partner or your partner has more than one partner -heart disease -history of an ectopic or tubal pregnancy -immune system problems -IUD in place -liver disease or tumor -problems with blood clots or take blood-thinners -use intravenous drugs -uterus of unusual shape -vaginal bleeding that has not been explained -an unusual or allergic reaction to levonorgestrel, other hormones, silicone, or polyethylene, medicines, foods, dyes, or preservatives -pregnant or trying to get pregnant -breast-feeding How should I use this medicine? This device is placed inside the uterus by a health care professional. Talk to your pediatrician regarding the use of this medicine in children. Special care may be needed. Overdosage: If you think you have taken too much of this medicine contact a poison control center or emergency room at once. NOTE: This medicine is only for you. Do not share this medicine with others. What if I miss a dose? This does not apply. What may interact with this medicine? Do not take this medicine with any of the following medications: -amprenavir -bosentan -fosamprenavir This medicine may also interact with  the following medications: -aprepitant -barbiturate medicines for inducing sleep or treating seizures -bexarotene -griseofulvin -medicines to treat seizures like carbamazepine, ethotoin, felbamate, oxcarbazepine, phenytoin, topiramate -modafinil -pioglitazone -rifabutin -rifampin -rifapentine -some medicines to treat HIV infection like atazanavir, indinavir, lopinavir, nelfinavir, tipranavir, ritonavir -St. John's wort -warfarin This list may not describe all possible interactions. Give your health care provider a list of all the medicines, herbs, non-prescription drugs, or dietary supplements you use. Also tell them if you smoke, drink alcohol, or use illegal drugs. Some items may interact with your medicine. What should I watch for while using this medicine? Visit your doctor or health care professional for regular check ups. See your doctor if you or your partner has sexual contact with others, becomes HIV positive, or gets a sexual transmitted disease. This product does not protect you against HIV infection (AIDS) or other sexually transmitted diseases. You can check the placement of the IUD yourself by reaching up to the top of your vagina with clean fingers to feel the threads. Do not pull on the threads. It is a good habit to check placement after each menstrual period. Call your doctor right away if you feel more of the IUD than just the threads or if you cannot feel the threads at all. The IUD may come out by itself. You may become pregnant if the device comes out. If you notice that the IUD has come out use a backup birth control method like condoms and call your health care provider. Using tampons will not change the position of the IUD and are okay to use during your period. What side effects may I notice from receiving this medicine?   Side effects that you should report to your doctor or health care professional as soon as possible: -allergic reactions like skin rash, itching or  hives, swelling of the face, lips, or tongue -fever, flu-like symptoms -genital sores -high blood pressure -no menstrual period for 6 weeks during use -pain, swelling, warmth in the leg -pelvic pain or tenderness -severe or sudden headache -signs of pregnancy -stomach cramping -sudden shortness of breath -trouble with balance, talking, or walking -unusual vaginal bleeding, discharge -yellowing of the eyes or skin Side effects that usually do not require medical attention (report to your doctor or health care professional if they continue or are bothersome): -acne -breast pain -change in sex drive or performance -changes in weight -cramping, dizziness, or faintness while the device is being inserted -headache -irregular menstrual bleeding within first 3 to 6 months of use -nausea This list may not describe all possible side effects. Call your doctor for medical advice about side effects. You may report side effects to FDA at 1-800-FDA-1088. Where should I keep my medicine? This does not apply. NOTE: This sheet is a summary. It may not cover all possible information. If you have questions about this medicine, talk to your doctor, pharmacist, or health care provider.  2013, [employer] Standard. (05/16/2011 1:54:04 PM)   Please drink plenty of water.   Follow up in 2 weeks for IUD placement.

## 2012-05-29 NOTE — Addendum Note (Signed)
Addended by: Soyla Murphy T on: 05/29/2012 11:03 AM   Modules accepted: Orders

## 2012-05-29 NOTE — Progress Notes (Signed)
Patient ID: Ketura Sirek Bucks, female   DOB: 08/09/1990, 22 y.o.   MRN: [id number] Subjective:    Nusrat C Guinta is a 22 y.o. female who presents for an annual exam. The patient has no complaints today. The patient is sexually active. GYN screening history: no prior history of gyn screening tests. The patient wears seatbelts: yes. The patient participates in regular exercise: no; pt thinking about it. Has the patient ever been transfused or tattooed?: yes. The patient reports that there is not domestic violence in her life.   Concerned about being pregnant since her period is late, n/v with some foods, feeling tired and constant lower abdominal pain that fluctuates.  Present for about 3 weeks.  Worse with lying on stomach.  No relation to food or BMs.  BMs are soft and regular.  Some dysuria.  No urgency or frequency.  Urine looks dark.  Has not been using any birth control except condoms.  + new sexual partners.   Menstrual History: OB History    Grav Para Term Preterm Abortions TAB SAB Ect Mult Living   3 1 1  0 2 1 1  0 0 1      Menarche age: 62 Patient's last menstrual period was 04/12/2012.    The following portions of the patient's history were reviewed and updated as appropriate: allergies and current medications.  Review of Systems Pertinent items are noted in HPI.    Objective:    BP 139/96  Pulse 102  Temp 97.4 F (36.3 C) (Oral)  Ht 5\' 1"  (1.549 m)  Wt 261 lb 6.4 oz (118.57 kg)  BMI 49.39 kg/m2  LMP 04/12/2012  General Appearance:    Alert, cooperative, no distress, appears stated age; morbidly obese  Head:    Normocephalic, without obvious abnormality, atraumatic  Eyes:    Sclera white  Ears:    n/a  Nose:   n/a  Throat:   Lips, mucosa, and tongue normal; teeth and gums normal  Neck:   Supple    Back:     Symmetric, no curvature, ROM normal, no CVA tenderness  Lungs:     Clear to auscultation bilaterally, respirations unlabored  Chest Wall:    No tenderness or  deformity   Heart:    Regular rate and rhythm, S1 and S2 normal, no murmur, rub   or gallop  Breast Exam:    n/a  Abdomen:     Soft, bowel sounds active all four quadrants, mild tenderness in lower quadrants    no masses, no organomegaly  Genitalia:    Normal female without lesion, discharge or tenderness  Rectal:    n/a  Extremities:   Extremities normal, atraumatic, no cyanosis or edema  Pulses:   2+ and symmetric all extremities  Skin:   Skin color, texture, turgor normal, no rashes or lesions  Lymph nodes:   n/a  Neurologic:   Gait normal  .    Assessment:    Healthy female exam.    Plan:     All questions answered. Await pap smear results. Contraception: IUD. Urine Preg: negative Discussed healthy lifestyle modifications. Follow up in 2 weeks for IUD placement GC specimen collected. Wet prep collected. Pregnancy test, result: negative. Urinalysis - not concerning for infection.  Pain likely related to irregular cycle - will give meloxicam (instructed not to take with ibuprofen).  Will place Mirena and see if this helps.  I have reviewed chart and agree with the plan of care.  HARRAWAY-SMITH, CAROLYN,  M.D, [facility]

## 2012-06-03 ENCOUNTER — Telehealth: Payer: Self-pay | Admitting: *Deleted

## 2012-06-03 NOTE — Telephone Encounter (Signed)
Called Teresa [name] to notify her that Dr. Burnice Logan- Katrinka Blazing had reveiwed her pap results again and the new guidelines recommend she needs  A follow up pap in one year and does not need the colposcopy. Patient voices understanding and asked if she could keep the appt and get her IUD then instead.

## 2012-06-03 NOTE — Telephone Encounter (Signed)
Message copied by Gerome Apley on Wed Jun 03, 2012  1:16 PM ------      Message from: Willodean Rosenthal      Created: Wed Jun 03, 2012  1:10 PM       Please call pt and notify of abnormal PAP:  LGSIL.  Needs colpo.            Please assist with scheduling.            Thx,      clh-S

## 2012-06-03 NOTE — Telephone Encounter (Signed)
Called Teresa [name] and informed her of abnormal pap smear and need for colpscopy- transferred to front desk to make appointment

## 2012-06-04 ENCOUNTER — Ambulatory Visit: Payer: Medicaid Other | Admitting: Medical

## 2012-06-17 ENCOUNTER — Ambulatory Visit: Payer: Medicaid Other | Admitting: Obstetrics & Gynecology

## 2012-06-19 ENCOUNTER — Emergency Department (HOSPITAL_COMMUNITY): Payer: Medicaid Other

## 2012-06-19 ENCOUNTER — Emergency Department (HOSPITAL_COMMUNITY)
Admission: EM | Admit: 2012-06-19 | Discharge: 2012-06-19 | Disposition: A | Discharge status: Home / Self Care | Payer: Medicaid Other | Attending: Emergency Medicine | Admitting: Emergency Medicine

## 2012-06-19 ENCOUNTER — Encounter (HOSPITAL_COMMUNITY): Payer: Self-pay | Admitting: Emergency Medicine

## 2012-06-19 DIAGNOSIS — W010XXA Fall on same level from slipping, tripping and stumbling without subsequent striking against object, initial encounter: Secondary | ICD-10-CM | POA: Insufficient documentation

## 2012-06-19 DIAGNOSIS — Z8619 Personal history of other infectious and parasitic diseases: Secondary | ICD-10-CM | POA: Insufficient documentation

## 2012-06-19 DIAGNOSIS — Y9301 Activity, walking, marching and hiking: Secondary | ICD-10-CM | POA: Insufficient documentation

## 2012-06-19 DIAGNOSIS — Z8659 Personal history of other mental and behavioral disorders: Secondary | ICD-10-CM | POA: Insufficient documentation

## 2012-06-19 DIAGNOSIS — M25476 Effusion, unspecified foot: Secondary | ICD-10-CM | POA: Insufficient documentation

## 2012-06-19 DIAGNOSIS — M25473 Effusion, unspecified ankle: Secondary | ICD-10-CM | POA: Insufficient documentation

## 2012-06-19 DIAGNOSIS — Z8709 Personal history of other diseases of the respiratory system: Secondary | ICD-10-CM | POA: Insufficient documentation

## 2012-06-19 DIAGNOSIS — Z87440 Personal history of urinary (tract) infections: Secondary | ICD-10-CM | POA: Insufficient documentation

## 2012-06-19 DIAGNOSIS — F17200 Nicotine dependence, unspecified, uncomplicated: Secondary | ICD-10-CM | POA: Insufficient documentation

## 2012-06-19 DIAGNOSIS — F909 Attention-deficit hyperactivity disorder, unspecified type: Secondary | ICD-10-CM | POA: Insufficient documentation

## 2012-06-19 DIAGNOSIS — Y9289 Other specified places as the place of occurrence of the external cause: Secondary | ICD-10-CM | POA: Insufficient documentation

## 2012-06-19 DIAGNOSIS — S82409A Unspecified fracture of shaft of unspecified fibula, initial encounter for closed fracture: Secondary | ICD-10-CM | POA: Insufficient documentation

## 2012-06-19 MED ORDER — IBUPROFEN 800 MG PO TABS
800.0000 mg | ORAL_TABLET | Freq: Once | ORAL | Status: AC
  Administered 2012-06-19: 800 mg via ORAL
  Filled 2012-06-19: qty 1

## 2012-06-19 MED ORDER — OXYCODONE-ACETAMINOPHEN 5-325 MG PO TABS
2.0000 | ORAL_TABLET | Freq: Once | ORAL | Status: AC
  Administered 2012-06-19: 2 via ORAL
  Filled 2012-06-19: qty 2

## 2012-06-19 MED ORDER — HYDROCODONE-ACETAMINOPHEN 5-325 MG PO TABS: 1.0000 | ORAL_TABLET | ORAL | Status: DC | PRN

## 2012-06-19 NOTE — ED Provider Notes (Signed)
History     CSN: [id number]  Arrival date & time 06/19/12  1232   First MD Initiated Contact with Patient 06/19/12 1238      Chief Complaint  Patient presents with  . Leg Injury    (Consider location/radiation/quality/duration/timing/severity/associated sxs/prior treatment) HPI Comments: Patient presents for lower leg/ankle pain after slipping and falling down a grassy hill. Patient states she heard a "crack" in her ankle followed by an ache-like pain in the lateral portion of her ankle and lower leg. Patient has not ambulated since the event; transported here via EMS  Patient is a 22 y.o. female presenting with ankle pain. The history is provided by the patient. No language interpreter was used.  Ankle Pain Location:  Ankle Time since incident:  2 hours Injury: yes   Mechanism of injury: fall   Fall:    Fall occurred: while walking down a grassy hill; patient slipped and fell on left side.   Impact surface:  Grass   Entrapped after fall: no   Ankle location:  L ankle Pain details:    Quality:  Aching   Radiates to:  Does not radiate   Severity:  Mild   Onset quality:  Sudden   Timing:  Constant   Progression:  Unchanged Chronicity:  New Foreign body present:  No foreign bodies Relieved by:  Nothing Worsened by:  Bearing weight, flexion, extension, adduction, abduction and rotation Ineffective treatments:  None tried Associated symptoms: decreased ROM and swelling   Associated symptoms: no back pain, no fever, no muscle weakness, no numbness and no tingling   Risk factors: obesity     Past Medical History  Diagnosis Date  . Asthma     childhood /grew out of it  . ADHD (attention deficit hyperactivity disorder)   . Urinary tract infection   . Depression   . Trichomonas     Past Surgical History  Procedure Laterality Date  . Wisdom tooth extraction    . Induced abortion      Family History  Problem Relation Age of Onset  . Diabetes Mother   . Hypertension  Mother   . Diabetes Maternal Grandfather   . Anesthesia problems Neg Hx   . Hypotension Neg Hx   . Malignant hyperthermia Neg Hx   . Pseudochol deficiency Neg Hx     History  Substance Use Topics  . Smoking status: Current Every Day Smoker -- 0.25 packs/day    Types: Cigarettes  . Smokeless tobacco: Never Used  . Alcohol Use: 0.0 oz/week     Comment: denies use currently but was a heavy user    OB History   Grav Para Term Preterm Abortions TAB SAB Ect Mult Living   3 1 1  0 2 1 1  0 0 1      Review of Systems  Constitutional: Negative for fever.  Eyes: Negative for visual disturbance.  Respiratory: Negative for shortness of breath.   Cardiovascular: Negative for chest pain.  Genitourinary: Negative.   Musculoskeletal: Positive for joint swelling. Negative for back pain.       Joint swelling of ankle  Skin: Negative for color change.       No open wound  Neurological: Negative for syncope, weakness and numbness.       No tingling    Allergies  Other and Tramadol  Home Medications   Current Outpatient Rx  Name  Route  Sig  Dispense  Refill  . HYDROcodone-acetaminophen (NORCO/VICODIN) 5-325 MG per tablet  Oral   Take 1-2 tablets by mouth every 4 (four) hours as needed for pain.   15 tablet   0     BP 122/56  Pulse 80  Temp(Src) 98.4 F (36.9 C) (Oral)  Resp 20  SpO2 99%  LMP 04/12/2012  Physical Exam  Constitutional: She is oriented to [place], place, and time. No distress.  Morbidly obese  HENT:  Head: Normocephalic and atraumatic.  Eyes: Conjunctivae are normal. No scleral icterus.  Neck: Normal range of motion.  Cardiovascular: Normal rate, regular rhythm, normal heart sounds and intact distal pulses.   Capillary refill in b/l lower extremity < 2 seconds  Pulmonary/Chest: Effort normal and breath sounds normal. No respiratory distress. She has no wheezes. She has no rales.  Abdominal: Soft. There is no tenderness. There is no rebound and no  guarding.  Musculoskeletal: She exhibits no edema.       Left knee: Normal.       Left ankle: She exhibits decreased range of motion and swelling. She exhibits no ecchymosis, no deformity, no laceration and normal pulse. Tenderness. Lateral malleolus tenderness found. No medial malleolus tenderness found.       Feet:  Neurological: She is alert and oriented to [place], place, and time. She displays no tremor. No sensory deficit. She exhibits normal muscle tone.  Skin: Skin is warm and dry. She is not diaphoretic. No erythema.  Psychiatric: She has a normal mood and affect. Her behavior is normal.    ED Course  Procedures (including critical care time)  Labs Reviewed - No data to display Dg Tibia/fibula Left  06/19/2012  *RADIOLOGY REPORT*  Clinical Data: Pain post trauma  LEFT TIBIA AND FIBULA - 2 VIEW  Comparison: None.  Findings:  Frontal and lateral views were obtained.  There is a spiral type fracture of the distal fibular diaphysis in near anatomic alignment.  No other fractures.  No dislocation.  Joint spaces appear intact.  IMPRESSION: Spiral fracture distal fibula in near anatomic alignment.   Original Report Authenticated By: Bretta Bang, M.D.    Dg Ankle 2 Views Left  06/19/2012  *RADIOLOGY REPORT*  Clinical Data: Post fall  LEFT ANKLE - 2 VIEW  Comparison: Tibia-fibula same day  Findings: Two views of the left ankle submitted.  No ankle fracture or subluxation.  Ankle mortise is preserved.  Again noted spinal fracture in distal left fibula.  IMPRESSION: No ankle fracture or subluxation.  Again noted spiral fracture and distal fibula.   Original Report Authenticated By: Natasha Mead, M.D.      1. Fibula fracture      MDM  Patient presents for pain in her L ankle after falling down a grassy slope today. On exam patient had swelling and tenderness on palpation in her L ankle primarily at the lateral malleolus as well as tenderness proximal to her ankle. Personally interpreted  X-rays which were obtained; showed spiral fracture of the distal fibula. Patient neurovascularly intact and unable to ambulate secondary to pain and fracture. Patient given crutches and posterior short leg splint and instruction to follow up with orthopedics for further evaluation and management. Discharged with vicodin to take as needed for pain and instructed to ice and elevate her leg for symptomatic relief. Patient work up and management discussed with Dr. Juleen China who is in agreement.  Filed Vitals:   06/19/12 1239 06/19/12 1627  BP: 122/56 109/65  Pulse: 80 80  Temp: 98.4 F (36.9 C) 98.5 F (36.9 C)  TempSrc:  Oral Oral  Resp: 20 16  SpO2: 99% 100%           Antony Madura, PA-C 06/20/12 1001

## 2012-06-19 NOTE — ED Notes (Signed)
[name] Tech aware of orders, will monitor.

## 2012-06-19 NOTE — ED Notes (Signed)
[id number]<YN> Expected date:<BR> Expected time:<BR> Means of arrival:<BR> Comments:<BR> 21yo-fall ankle pain

## 2012-06-19 NOTE — ED Notes (Signed)
EMS reports patient was walking down grass slope and injured her left leg, just above left ankle.  No other complaints at this time.

## 2012-06-19 NOTE — ED Notes (Signed)
Patient transported to X-ray 

## 2012-06-19 NOTE — ED Notes (Signed)
Ortho Tech at bedside.  

## 2012-06-21 ENCOUNTER — Emergency Department (HOSPITAL_COMMUNITY)
Admission: EM | Admit: 2012-06-21 | Discharge: 2012-06-21 | Disposition: A | Discharge status: Home / Self Care | Payer: Medicaid Other | Attending: Emergency Medicine | Admitting: Emergency Medicine

## 2012-06-21 ENCOUNTER — Encounter (HOSPITAL_COMMUNITY): Payer: Self-pay | Admitting: Emergency Medicine

## 2012-06-21 DIAGNOSIS — X58XXXA Exposure to other specified factors, initial encounter: Secondary | ICD-10-CM | POA: Insufficient documentation

## 2012-06-21 DIAGNOSIS — F17200 Nicotine dependence, unspecified, uncomplicated: Secondary | ICD-10-CM | POA: Insufficient documentation

## 2012-06-21 DIAGNOSIS — G8911 Acute pain due to trauma: Secondary | ICD-10-CM | POA: Insufficient documentation

## 2012-06-21 DIAGNOSIS — S82899A Other fracture of unspecified lower leg, initial encounter for closed fracture: Secondary | ICD-10-CM | POA: Insufficient documentation

## 2012-06-21 DIAGNOSIS — Y939 Activity, unspecified: Secondary | ICD-10-CM | POA: Insufficient documentation

## 2012-06-21 DIAGNOSIS — Z8709 Personal history of other diseases of the respiratory system: Secondary | ICD-10-CM | POA: Insufficient documentation

## 2012-06-21 DIAGNOSIS — M79606 Pain in leg, unspecified: Secondary | ICD-10-CM

## 2012-06-21 DIAGNOSIS — Z760 Encounter for issue of repeat prescription: Secondary | ICD-10-CM | POA: Insufficient documentation

## 2012-06-21 DIAGNOSIS — Y929 Unspecified place or not applicable: Secondary | ICD-10-CM | POA: Insufficient documentation

## 2012-06-21 DIAGNOSIS — Z8619 Personal history of other infectious and parasitic diseases: Secondary | ICD-10-CM | POA: Insufficient documentation

## 2012-06-21 DIAGNOSIS — Z87440 Personal history of urinary (tract) infections: Secondary | ICD-10-CM | POA: Insufficient documentation

## 2012-06-21 DIAGNOSIS — Z8659 Personal history of other mental and behavioral disorders: Secondary | ICD-10-CM | POA: Insufficient documentation

## 2012-06-21 MED ORDER — HYDROCODONE-ACETAMINOPHEN 5-325 MG PO TABS: 1.0000 | ORAL_TABLET | ORAL | Status: DC | PRN

## 2012-06-21 MED ORDER — HYDROCODONE-ACETAMINOPHEN 5-325 MG PO TABS
2.0000 | ORAL_TABLET | Freq: Once | ORAL | Status: AC
  Administered 2012-06-21: 2 via ORAL
  Filled 2012-06-21: qty 2

## 2012-06-21 MED ORDER — IBUPROFEN 800 MG PO TABS: 800.0000 mg | ORAL_TABLET | Freq: Three times a day (TID) | ORAL | Status: DC

## 2012-06-21 NOTE — ED Provider Notes (Signed)
Medical screening examination/treatment/procedure(s) were performed by non-physician practitioner and as supervising physician I was immediately available for consultation/collaboration.   Kalep Full L Shikha Bibb, MD 06/21/12 1808 

## 2012-06-21 NOTE — ED Provider Notes (Signed)
History     CSN: [id number]  Arrival date & time 06/21/12  1015   First MD Initiated Contact with Patient 06/21/12 1042      Chief Complaint  Patient presents with  . Leg Pain    (Consider location/radiation/quality/duration/timing/severity/associated sxs/prior treatment) Patient is a 22 y.o. female presenting with leg pain. The history is provided by the patient.  Leg Pain Location:  Leg Leg location:  L leg Associated symptoms: no fever   Associated symptoms comment:  She was seen on 06/19/12 and diagnosed with non-displaced fibular fracture. She returns today stating she is out of her pain medication. She also reports, while at rest, she feels the splint pushes against the fracture area causing sharp pain.    Past Medical History  Diagnosis Date  . Asthma     childhood /grew out of it  . ADHD (attention deficit hyperactivity disorder)   . Urinary tract infection   . Depression   . Trichomonas     Past Surgical History  Procedure Laterality Date  . Wisdom tooth extraction    . Induced abortion      Family History  Problem Relation Age of Onset  . Diabetes Mother   . Hypertension Mother   . Diabetes Maternal Grandfather   . Anesthesia problems Neg Hx   . Hypotension Neg Hx   . Malignant hyperthermia Neg Hx   . Pseudochol deficiency Neg Hx     History  Substance Use Topics  . Smoking status: Current Every Day Smoker -- 0.25 packs/day    Types: Cigarettes  . Smokeless tobacco: Never Used  . Alcohol Use: 0.0 oz/week     Comment: denies use currently but was a heavy user    OB History   Grav Para Term Preterm Abortions TAB SAB Ect Mult Living   3 1 1  0 2 1 1  0 0 1      Review of Systems  Constitutional: Negative for fever.  Musculoskeletal:       See HPI.  Skin:       No color change.  Neurological: Negative for numbness.    Allergies  Other and Tramadol  Home Medications   Current Outpatient Rx  Name  Route  Sig  Dispense  Refill  .  HYDROcodone-acetaminophen (NORCO/VICODIN) 5-325 MG per tablet   Oral   Take 1-2 tablets by mouth every 4 (four) hours as needed for pain.   15 tablet   0     BP 119/81  Pulse 90  Temp(Src) 97.8 F (36.6 C)  SpO2 97%  LMP 04/12/2012  Physical Exam  Constitutional: She appears well-developed and well-nourished. No distress.  Musculoskeletal:  Splint in place to left lower extremity. No swelling of toes or above splint.     ED Course  Procedures (including critical care time)  Labs Reviewed - No data to display Dg Tibia/fibula Left  06/19/2012  *RADIOLOGY REPORT*  Clinical Data: Pain post trauma  LEFT TIBIA AND FIBULA - 2 VIEW  Comparison: None.  Findings:  Frontal and lateral views were obtained.  There is a spiral type fracture of the distal fibular diaphysis in near anatomic alignment.  No other fractures.  No dislocation.  Joint spaces appear intact.  IMPRESSION: Spiral fracture distal fibula in near anatomic alignment.   Original Report Authenticated By: Bretta Bang, M.D.    Dg Ankle 2 Views Left  06/19/2012  *RADIOLOGY REPORT*  Clinical Data: Post fall  LEFT ANKLE - 2 VIEW  Comparison: Tibia-fibula  same day  Findings: Two views of the left ankle submitted.  No ankle fracture or subluxation.  Ankle mortise is preserved.  Again noted spinal fracture in distal left fibula.  IMPRESSION: No ankle fracture or subluxation.  Again noted spiral fracture and distal fibula.   Original Report Authenticated By: Natasha Mead, M.D.      No diagnosis found. 1. Fracture left fibula with splint check   MDM  Splint removed by ortho tech and additional padding added between fracture and splint. Pain medications refilled.         Arnoldo Hooker, PA-C 06/21/12 1151

## 2012-06-21 NOTE — ED Notes (Signed)
Pt broke left fib on Friday ran out of pain meds and was told to come back to er. Was only given 15 she states was not enough

## 2012-06-22 NOTE — ED Provider Notes (Signed)
Medical screening examination/treatment/procedure(s) were performed by non-physician practitioner and as supervising physician I was immediately available for consultation/collaboration.  Raeford Razor, MD 06/22/12 [phone]

## 2012-07-02 ENCOUNTER — Ambulatory Visit: Payer: Medicaid Other | Admitting: Obstetrics & Gynecology

## 2012-08-05 ENCOUNTER — Inpatient Hospital Stay (HOSPITAL_COMMUNITY)
Admission: AD | Admit: 2012-08-05 | Discharge: 2012-08-05 | Disposition: A | Discharge status: Home / Self Care | Payer: Self-pay | Source: Ambulatory Visit | Attending: Obstetrics & Gynecology | Admitting: Obstetrics & Gynecology

## 2012-08-05 DIAGNOSIS — R109 Unspecified abdominal pain: Secondary | ICD-10-CM | POA: Insufficient documentation

## 2012-08-05 DIAGNOSIS — L293 Anogenital pruritus, unspecified: Secondary | ICD-10-CM | POA: Insufficient documentation

## 2012-08-05 LAB — URINALYSIS, ROUTINE W REFLEX MICROSCOPIC
Bilirubin Urine: NEGATIVE
Ketones, ur: NEGATIVE mg/dL
Nitrite: NEGATIVE
Protein, ur: NEGATIVE mg/dL
pH: 6 (ref 5.0–8.0)

## 2012-08-05 LAB — URINE MICROSCOPIC-ADD ON

## 2012-08-05 NOTE — MAU Note (Signed)
Patient states she has had lower abdominal pain for about one week. Has had vaginal itching for about 3 days. Has not had a period since November.

## 2012-08-05 NOTE — MAU Note (Signed)
Patient states she had a baby 7-31. Had some periods after that but her last period was in November, not sure when. States the pain started on the way to the hospital.

## 2012-08-05 NOTE — MAU Provider Note (Signed)
Patient checked in with complaint of missed cycle, itching and abdominal pain.  Her UPT is negative.  I talked to the patient regarding her results she I was told she needed to leave. She cannot wait for exam-has to pick up her daughter.  Explained that all rooms are full and that I could give her results of UPT but couldn't evaluate the other sxs without an exam. She signed AMA form.  Understands she can return for further evaluation.  She appears comfortable and is smiling.

## 2012-08-13 ENCOUNTER — Inpatient Hospital Stay (HOSPITAL_COMMUNITY)
Admission: AD | Admit: 2012-08-13 | Discharge: 2012-08-13 | Disposition: A | Discharge status: Home / Self Care | Payer: Medicaid Other | Source: Ambulatory Visit | Attending: Obstetrics & Gynecology | Admitting: Obstetrics & Gynecology

## 2012-08-13 ENCOUNTER — Encounter (HOSPITAL_COMMUNITY): Payer: Self-pay | Admitting: *Deleted

## 2012-08-13 DIAGNOSIS — L293 Anogenital pruritus, unspecified: Secondary | ICD-10-CM | POA: Insufficient documentation

## 2012-08-13 DIAGNOSIS — N949 Unspecified condition associated with female genital organs and menstrual cycle: Secondary | ICD-10-CM | POA: Insufficient documentation

## 2012-08-13 DIAGNOSIS — A599 Trichomoniasis, unspecified: Secondary | ICD-10-CM

## 2012-08-13 DIAGNOSIS — A5901 Trichomonal vulvovaginitis: Secondary | ICD-10-CM | POA: Insufficient documentation

## 2012-08-13 LAB — WET PREP, GENITAL: Yeast Wet Prep HPF POC: NONE SEEN

## 2012-08-13 LAB — URINE MICROSCOPIC-ADD ON

## 2012-08-13 LAB — URINALYSIS, ROUTINE W REFLEX MICROSCOPIC
Glucose, UA: NEGATIVE mg/dL
Hgb urine dipstick: NEGATIVE
Protein, ur: NEGATIVE mg/dL
Specific Gravity, Urine: >1.03 — ABNORMAL HIGH (ref 1.005–1.030)
Urobilinogen, UA: 0.2 mg/dL (ref 0.0–1.0)

## 2012-08-13 MED ORDER — METRONIDAZOLE 500 MG PO TABS
2000.0000 mg | ORAL_TABLET | Freq: Once | ORAL | Status: AC
  Administered 2012-08-13: 2000 mg via ORAL
  Filled 2012-08-13: qty 4

## 2012-08-13 NOTE — MAU Provider Note (Signed)
Attestation of Attending Supervision of Advanced Practitioner (PA/CNM/NP): Evaluation and management procedures were performed by the Advanced Practitioner under my supervision and collaboration.  I have reviewed the Advanced Practitioner's note and chart, and I agree with the management and plan.  Brayton Baumgartner, MD, FACOG Attending Obstetrician & Gynecologist Faculty Practice, [facility]  

## 2012-08-13 NOTE — MAU Provider Note (Signed)
History     CSN: [id number]  Arrival date and time: 08/13/12 1725   None     Chief Complaint  Patient presents with  . Vaginal Itching  . Possible Pregnancy   HPI  Pt is a [id number] here non-pregnant with report of vaginal irritation, discharge, and order for a "few" weeks.  Denies irregular bleeding or pelvic pain.  Patient's last menstrual period was 03/13/2012.   Past Medical History  Diagnosis Date  . Asthma     childhood /grew out of it  . ADHD (attention deficit hyperactivity disorder)   . Urinary tract infection   . Depression   . Trichomonas     Past Surgical History  Procedure Laterality Date  . Wisdom tooth extraction    . Induced abortion      Family History  Problem Relation Age of Onset  . Diabetes Mother   . Hypertension Mother   . Diabetes Maternal Grandfather   . Anesthesia problems Neg Hx   . Hypotension Neg Hx   . Malignant hyperthermia Neg Hx   . Pseudochol deficiency Neg Hx     History  Substance Use Topics  . Smoking status: Current Every Day Smoker -- 0.25 packs/day    Types: Cigarettes  . Smokeless tobacco: Never Used  . Alcohol Use: 0.0 oz/week     Comment: denies use currently but was a heavy user    Allergies:  Allergies  Allergen Reactions  . Other Anaphylaxis    Grapes, strawberries, bananas, pineapple, oranges  . Tramadol Nausea And Vomiting    Prescriptions prior to admission  Medication Sig Dispense Refill  . HYDROcodone-acetaminophen (NORCO/VICODIN) 5-325 MG per tablet Take 1-2 tablets by mouth every 4 (four) hours as needed for pain.  25 tablet  0    Review of Systems  Genitourinary:       Vaginal discharge and odor  All other systems reviewed and are negative.   Physical Exam   Blood pressure 122/76, pulse 110, temperature 98.2 F (36.8 C), temperature source Oral, resp. rate 18, last menstrual period 03/13/2012.  Physical Exam  Constitutional: She is oriented to [place], place, and time. She appears  well-developed and well-nourished.  HENT:  Head: Normocephalic.  Neck: Normal range of motion. Neck supple.  Cardiovascular: Normal rate and regular rhythm.   Respiratory: Effort normal and breath sounds normal.  GI: Soft. She exhibits no mass. There is no tenderness. There is no guarding.  Genitourinary: Cervix exhibits motion tenderness. Vaginal discharge (white, creamy; frothy) found.  +fishy odor  Neurological: She is alert and oriented to [place], place, and time.  Skin: Skin is warm and dry.    MAU Course  Procedures  Results for orders placed during the hospital encounter of 08/13/12 (from the past 24 hour(s))  URINALYSIS, ROUTINE W REFLEX MICROSCOPIC     Status: Abnormal   Collection Time    08/13/12  5:44 PM      Result Value Range   Color, Urine YELLOW  YELLOW   APPearance CLEAR  CLEAR   Specific Gravity, Urine >1.030 (*) 1.005 - 1.030   pH 6.0  5.0 - 8.0   Glucose, UA NEGATIVE  NEGATIVE mg/dL   Hgb urine dipstick NEGATIVE  NEGATIVE   Bilirubin Urine NEGATIVE  NEGATIVE   Ketones, ur NEGATIVE  NEGATIVE mg/dL   Protein, ur NEGATIVE  NEGATIVE mg/dL   Urobilinogen, UA 0.2  0.0 - 1.0 mg/dL   Nitrite NEGATIVE  NEGATIVE   Leukocytes, UA SMALL (*) NEGATIVE  URINE MICROSCOPIC-ADD ON     Status: Abnormal   Collection Time    08/13/12  5:44 PM      Result Value Range   Squamous Epithelial / LPF FEW (*) RARE   WBC, UA 3-6  <3 WBC/hpf   RBC / HPF 0-2  <3 RBC/hpf   Bacteria, UA RARE  RARE   Urine-Other TRICHOMONAS PRESENT    POCT PREGNANCY, URINE     Status: None   Collection Time    08/13/12  5:52 PM      Result Value Range   Preg Test, Ur NEGATIVE  NEGATIVE  WET PREP, GENITAL     Status: Abnormal   Collection Time    08/13/12  6:20 PM      Result Value Range   Yeast Wet Prep HPF POC NONE SEEN  NONE SEEN   Trich, Wet Prep FEW (*) NONE SEEN   Clue Cells Wet Prep HPF POC NONE SEEN  NONE SEEN   WBC, Wet Prep HPF POC MODERATE (*) NONE SEEN    Assessment and Plan   Trichomoniasis  Plan: Flagyl 2gm in MAU Advised partner treatment Reviewed disease transmission GC/CT pending   [facility] 08/13/2012, 7:22 PM

## 2012-08-13 NOTE — MAU Note (Signed)
Pt presents with complaints of vaginal itching odor. States she that she may be pregnant.

## 2012-09-25 ENCOUNTER — Encounter (HOSPITAL_COMMUNITY): Payer: Self-pay

## 2012-09-25 ENCOUNTER — Inpatient Hospital Stay (HOSPITAL_COMMUNITY)
Admission: AD | Admit: 2012-09-25 | Discharge: 2012-09-25 | Disposition: A | Discharge status: Home / Self Care | Payer: Medicaid Other | Source: Ambulatory Visit | Attending: Obstetrics | Admitting: Obstetrics

## 2012-09-25 DIAGNOSIS — R102 Pelvic and perineal pain: Secondary | ICD-10-CM

## 2012-09-25 DIAGNOSIS — M545 Low back pain, unspecified: Secondary | ICD-10-CM | POA: Insufficient documentation

## 2012-09-25 DIAGNOSIS — R350 Frequency of micturition: Secondary | ICD-10-CM | POA: Insufficient documentation

## 2012-09-25 DIAGNOSIS — N949 Unspecified condition associated with female genital organs and menstrual cycle: Secondary | ICD-10-CM | POA: Insufficient documentation

## 2012-09-25 DIAGNOSIS — R109 Unspecified abdominal pain: Secondary | ICD-10-CM | POA: Insufficient documentation

## 2012-09-25 LAB — POCT PREGNANCY, URINE: Preg Test, Ur: NEGATIVE

## 2012-09-25 LAB — URINALYSIS, ROUTINE W REFLEX MICROSCOPIC
Bilirubin Urine: NEGATIVE
Nitrite: NEGATIVE
Specific Gravity, Urine: >1.03 — ABNORMAL HIGH (ref 1.005–1.030)
pH: 6 (ref 5.0–8.0)

## 2012-09-25 LAB — WET PREP, GENITAL

## 2012-09-25 NOTE — MAU Provider Note (Signed)
History     CSN: [id number]  Arrival date and time: 09/25/12 1548   First Provider Initiated Contact with Patient 09/25/12 1631      Chief Complaint  Patient presents with  . Abdominal Pain  . Back Pain   HPI 22 y.o. Teresa [name] here c/o low abd and low back pain x 2 days. States that her urine was orange 3 days ago and she has been having urinary frequency. Denies dysuria, n/v, fever, chills, vaginal discharge or bleeding.   Past Medical History  Diagnosis Date  . Asthma     childhood /grew out of it  . ADHD (attention deficit hyperactivity disorder)   . Urinary tract infection   . Depression   . Trichomonas     Past Surgical History  Procedure Laterality Date  . Wisdom tooth extraction    . Induced abortion      Family History  Problem Relation Age of Onset  . Diabetes Mother   . Hypertension Mother   . Diabetes Maternal Grandfather   . Anesthesia problems Neg Hx   . Hypotension Neg Hx   . Malignant hyperthermia Neg Hx   . Pseudochol deficiency Neg Hx     History  Substance Use Topics  . Smoking status: Current Every Day Smoker -- 0.25 packs/day    Types: Cigarettes  . Smokeless tobacco: Never Used  . Alcohol Use: 0.0 oz/week     Comment: denies use currently but was a heavy user    Allergies:  Allergies  Allergen Reactions  . Other Anaphylaxis    Grapes, strawberries, bananas, pineapple, oranges  . Tramadol Nausea And Vomiting    No prescriptions prior to admission    Review of Systems  Constitutional: Negative.   Respiratory: Negative.   Cardiovascular: Negative.   Gastrointestinal: Positive for abdominal pain. Negative for nausea, vomiting, diarrhea and constipation.  Genitourinary: Positive for frequency. Negative for dysuria, urgency, hematuria and flank pain.       Negative for vaginal bleeding, vaginal discharge, dyspareunia  Musculoskeletal: Positive for back pain.  Neurological: Negative.   Psychiatric/Behavioral: Negative.     Physical Exam   Blood pressure 112/64, pulse 107, temperature 98.2 F (36.8 C), temperature source Oral, resp. rate 18, height 5\' 2"  (1.575 m), weight 274 lb (124.286 kg), last menstrual period 09/03/2012.  Physical Exam  Nursing note and vitals reviewed. Constitutional: She is oriented to [place], place, and time. She appears well-developed and well-nourished. No distress.  HENT:  Head: Normocephalic and atraumatic.  Cardiovascular: Normal rate, regular rhythm and normal heart sounds.   Respiratory: Effort normal and breath sounds normal. No respiratory distress.  GI: Soft. Bowel sounds are normal. She exhibits no distension and no mass. There is no tenderness. There is no rebound and no guarding.  Genitourinary: There is no rash or lesion on the right labia. There is no rash or lesion on the left labia. Uterus is not deviated, not enlarged, not fixed and not tender. Cervix exhibits no motion tenderness, no discharge and no friability. Right adnexum displays no mass, no tenderness and no fullness. Left adnexum displays no mass, no tenderness and no fullness. No erythema, tenderness or bleeding around the vagina. No vaginal discharge found.  Musculoskeletal: Normal range of motion.  Neurological: She is alert and oriented to [place], place, and time.  Skin: Skin is warm and dry.  Psychiatric: She has a normal mood and affect.    MAU Course  Procedures Results for orders placed during the hospital encounter of  09/25/12 (from the past 24 hour(s))  URINALYSIS, ROUTINE W REFLEX MICROSCOPIC     Status: Abnormal   Collection Time    09/25/12  4:20 PM      Result Value Range   Color, Urine YELLOW  YELLOW   APPearance CLEAR  CLEAR   Specific Gravity, Urine >1.030 (*) 1.005 - 1.030   pH 6.0  5.0 - 8.0   Glucose, UA NEGATIVE  NEGATIVE mg/dL   Hgb urine dipstick NEGATIVE  NEGATIVE   Bilirubin Urine NEGATIVE  NEGATIVE   Ketones, ur NEGATIVE  NEGATIVE mg/dL   Protein, ur NEGATIVE  NEGATIVE  mg/dL   Urobilinogen, UA 0.2  0.0 - 1.0 mg/dL   Nitrite NEGATIVE  NEGATIVE   Leukocytes, UA NEGATIVE  NEGATIVE  POCT PREGNANCY, URINE     Status: None   Collection Time    09/25/12  4:23 PM      Result Value Range   Preg Test, Ur NEGATIVE  NEGATIVE  WET PREP, GENITAL     Status: Abnormal   Collection Time    09/25/12  4:35 PM      Result Value Range   Yeast Wet Prep HPF POC NONE SEEN  NONE SEEN   Trich, Wet Prep NONE SEEN  NONE SEEN   Clue Cells Wet Prep HPF POC NONE SEEN  NONE SEEN   WBC, Wet Prep HPF POC FEW (*) NONE SEEN     Assessment and Plan   1. Pelvic pain in female   Pt completely nontender on exam, has not taken anything for pain, declines medication for pain here. No acute concerns today. F/U as needed in office.      Medication List    TAKE these medications       HYDROcodone-acetaminophen 5-325 MG per tablet  Commonly known as:  NORCO/VICODIN  Take 1-2 tablets by mouth every 4 (four) hours as needed for pain.            Follow-up Information   Follow up with HARPER,CHARLES A, MD. (As needed)    Contact information:   [address] [place] [address] [phone]         Georges Mouse 09/25/2012, 5:07 PM

## 2012-09-25 NOTE — MAU Note (Addendum)
About 3 days urine was orange. The next day started having abd pain (lower suprapubic regio), pain comes and goes. Low back started last night.  (Neg CVA, though reports is sore with check)    Frequency with urination, no pain or urgency.

## 2012-09-26 LAB — GC/CHLAMYDIA PROBE AMP
CT Probe RNA: NEGATIVE
GC Probe RNA: NEGATIVE

## 2012-10-21 ENCOUNTER — Inpatient Hospital Stay (HOSPITAL_COMMUNITY)
Admission: AD | Admit: 2012-10-21 | Discharge: 2012-10-21 | Disposition: A | Discharge status: Home / Self Care | Payer: Medicaid Other | Source: Ambulatory Visit | Attending: Obstetrics & Gynecology | Admitting: Obstetrics & Gynecology

## 2012-10-21 ENCOUNTER — Encounter (HOSPITAL_COMMUNITY): Payer: Self-pay | Admitting: *Deleted

## 2012-10-21 DIAGNOSIS — R109 Unspecified abdominal pain: Secondary | ICD-10-CM | POA: Insufficient documentation

## 2012-10-21 DIAGNOSIS — R110 Nausea: Secondary | ICD-10-CM | POA: Insufficient documentation

## 2012-10-21 DIAGNOSIS — Z3202 Encounter for pregnancy test, result negative: Secondary | ICD-10-CM | POA: Insufficient documentation

## 2012-10-21 LAB — URINALYSIS, ROUTINE W REFLEX MICROSCOPIC
Glucose, UA: NEGATIVE mg/dL
Leukocytes, UA: NEGATIVE
Nitrite: NEGATIVE
Protein, ur: NEGATIVE mg/dL
Urobilinogen, UA: 0.2 mg/dL (ref 0.0–1.0)

## 2012-10-21 LAB — POCT PREGNANCY, URINE: Preg Test, Ur: NEGATIVE

## 2012-10-21 NOTE — MAU Note (Signed)
Patient states she has been having lower abdominal pain for about 4 days. Has some nausea, no vomiting. Patient states she is 2 weeks late for her period. Denies bleeding or discharge.

## 2012-10-21 NOTE — MAU Note (Signed)
Came in because she thougth she was pregnant.  Had not done a home test.  When told test was neg- she wants to leave.  Asked about the abd pain- states yes, she hurts but does not want to get checked out.

## 2012-10-21 NOTE — MAU Provider Note (Signed)
S: 22 y.o. [id number] presents to MAU for pregnancy test. She denies abdominal pain, and reports she listed pain when she came in but has not really had any pain.  She denies vaginal bleeding, vaginal itching/burning, urinary symptoms, h/a, dizziness, n/v, or fever/chills.    O: BP 118/75  Pulse 81  Temp(Src) 98.6 F (37 C) (Oral)  Resp 18  Ht 5\' 2"  (1.575 m)  Wt 124.467 kg (274 lb 6.4 oz)  BMI 50.18 kg/m2  SpO2 100%  LMP 09/03/2012  Results for orders placed during the hospital encounter of 10/21/12 (from the past 24 hour(s))  URINALYSIS, ROUTINE W REFLEX MICROSCOPIC     Status: None   Collection Time    10/21/12  3:00 PM      Result Value Range   Color, Urine YELLOW  YELLOW   APPearance CLEAR  CLEAR   Specific Gravity, Urine 1.025  1.005 - 1.030   pH 6.0  5.0 - 8.0   Glucose, UA NEGATIVE  NEGATIVE mg/dL   Hgb urine dipstick NEGATIVE  NEGATIVE   Bilirubin Urine NEGATIVE  NEGATIVE   Ketones, ur NEGATIVE  NEGATIVE mg/dL   Protein, ur NEGATIVE  NEGATIVE mg/dL   Urobilinogen, UA 0.2  0.0 - 1.0 mg/dL   Nitrite NEGATIVE  NEGATIVE   Leukocytes, UA NEGATIVE  NEGATIVE  POCT PREGNANCY, URINE     Status: None   Collection Time    10/21/12  3:20 PM      Result Value Range   Preg Test, Ur NEGATIVE  NEGATIVE    A: 1. Negative pregnancy test    P: D/C home F/U with Gyn provider for routine care Return to MAU as needed  Sharen Counter Certified Nurse-Midwife

## 2012-10-22 ENCOUNTER — Emergency Department (HOSPITAL_COMMUNITY): Payer: Medicaid Other

## 2012-10-22 ENCOUNTER — Encounter (HOSPITAL_COMMUNITY): Payer: Self-pay | Admitting: Emergency Medicine

## 2012-10-22 ENCOUNTER — Emergency Department (HOSPITAL_COMMUNITY)
Admission: EM | Admit: 2012-10-22 | Discharge: 2012-10-22 | Disposition: A | Discharge status: Home / Self Care | Payer: Medicaid Other | Attending: Emergency Medicine | Admitting: Emergency Medicine

## 2012-10-22 DIAGNOSIS — Z2089 Contact with and (suspected) exposure to other communicable diseases: Secondary | ICD-10-CM | POA: Insufficient documentation

## 2012-10-22 DIAGNOSIS — Y929 Unspecified place or not applicable: Secondary | ICD-10-CM | POA: Insufficient documentation

## 2012-10-22 DIAGNOSIS — X500XXA Overexertion from strenuous movement or load, initial encounter: Secondary | ICD-10-CM | POA: Insufficient documentation

## 2012-10-22 DIAGNOSIS — M79609 Pain in unspecified limb: Secondary | ICD-10-CM | POA: Insufficient documentation

## 2012-10-22 DIAGNOSIS — S46811A Strain of other muscles, fascia and tendons at shoulder and upper arm level, right arm, initial encounter: Secondary | ICD-10-CM

## 2012-10-22 DIAGNOSIS — S239XXA Sprain of unspecified parts of thorax, initial encounter: Secondary | ICD-10-CM | POA: Insufficient documentation

## 2012-10-22 DIAGNOSIS — J45909 Unspecified asthma, uncomplicated: Secondary | ICD-10-CM | POA: Insufficient documentation

## 2012-10-22 DIAGNOSIS — Y998 Other external cause status: Secondary | ICD-10-CM | POA: Insufficient documentation

## 2012-10-22 DIAGNOSIS — F17200 Nicotine dependence, unspecified, uncomplicated: Secondary | ICD-10-CM | POA: Insufficient documentation

## 2012-10-22 DIAGNOSIS — F3289 Other specified depressive episodes: Secondary | ICD-10-CM | POA: Insufficient documentation

## 2012-10-22 DIAGNOSIS — F329 Major depressive disorder, single episode, unspecified: Secondary | ICD-10-CM | POA: Insufficient documentation

## 2012-10-22 DIAGNOSIS — F909 Attention-deficit hyperactivity disorder, unspecified type: Secondary | ICD-10-CM | POA: Insufficient documentation

## 2012-10-22 DIAGNOSIS — Z888 Allergy status to other drugs, medicaments and biological substances status: Secondary | ICD-10-CM | POA: Insufficient documentation

## 2012-10-22 MED ORDER — NAPROXEN 500 MG PO TABS: 500.0000 mg | ORAL_TABLET | Freq: Two times a day (BID) | ORAL | Status: DC | PRN

## 2012-10-22 MED ORDER — OXYCODONE-ACETAMINOPHEN 5-325 MG PO TABS
2.0000 | ORAL_TABLET | Freq: Once | ORAL | Status: AC
  Administered 2012-10-22: 2 via ORAL
  Filled 2012-10-22: qty 2

## 2012-10-22 MED ORDER — HYDROCODONE-ACETAMINOPHEN 5-325 MG PO TABS: 1.0000 | ORAL_TABLET | Freq: Four times a day (QID) | ORAL | Status: DC | PRN

## 2012-10-22 MED ORDER — METHOCARBAMOL 750 MG PO TABS: 750.0000 mg | ORAL_TABLET | Freq: Four times a day (QID) | ORAL | Status: DC | PRN

## 2012-10-22 NOTE — ED Notes (Signed)
Pt moved furniture yesterday, woke this am with right shoulder pain.

## 2012-10-22 NOTE — ED Provider Notes (Signed)
History    This chart was scribed for non-physician practitioner Dierdre Forth,. PA working with Gilda Crease, * by Quintella Reichert, ED Scribe. This patient was seen in room TR05C/TR05C and the patient's care was started at 3:54 PM .   CSN: [id number]  Arrival date & time 10/22/12  1251    Chief Complaint  Patient presents with  . Shoulder Pain    The history is provided by the patient. No language interpreter was used.    HPI Comments: Teresa [name] is a 22 y.o. female with h/o degenerative disc disease who presents to the Emergency Department complaining of constant, moderate-to-severe right shoulder pain that began last night.  Pt notes that she was moving furniture when she felt a "pull" in her right arm.  Presently she complains of pain radiating from the upper back into the right shoulder and right upper arm to the elbow.  Pain is exacerbated by moving the shoulder.  She has attempted to treat pain with ibuprofen, without relief.  Pt does not medicate regularly for her degenerative disc disease.     Past Medical History  Diagnosis Date  . Asthma     childhood /grew out of it  . ADHD (attention deficit hyperactivity disorder)   . Urinary tract infection   . Depression   . Trichomonas    Past Surgical History  Procedure Laterality Date  . Wisdom tooth extraction    . Induced abortion     Family History  Problem Relation Age of Onset  . Diabetes Mother   . Hypertension Mother   . Diabetes Maternal Grandfather   . Anesthesia problems Neg Hx   . Hypotension Neg Hx   . Malignant hyperthermia Neg Hx   . Pseudochol deficiency Neg Hx    History  Substance Use Topics  . Smoking status: Current Every Day Smoker -- 0.25 packs/day    Types: Cigarettes  . Smokeless tobacco: Never Used  . Alcohol Use: 0.0 oz/week     Comment: denies use currently but was a heavy user   OB History   Grav Para Term Preterm Abortions TAB SAB Ect Mult Living   3 1 1  0  2 1 1  0 0 1     Review of Systems  HENT: Negative for neck pain and neck stiffness.   Musculoskeletal: Positive for back pain and arthralgias. Negative for joint swelling.  Skin: Negative for wound.  Neurological: Negative for numbness.  All other systems reviewed and are negative.    Allergies  Other and Tramadol  Home Medications   Current Outpatient Rx  Name  Route  Sig  Dispense  Refill  . HYDROcodone-acetaminophen (NORCO/VICODIN) 5-325 MG per tablet   Oral   Take 1 tablet by mouth every 6 (six) hours as needed for pain (Take 1 - 2 tablets every 4 - 6 hours.).   7 tablet   0   . methocarbamol (ROBAXIN) 750 MG tablet   Oral   Take 1 tablet (750 mg total) by mouth 4 (four) times daily as needed (Take 1 tablet every 6 hours as needed for muscle spasms.).   20 tablet   0   . naproxen (NAPROSYN) 500 MG tablet   Oral   Take 1 tablet (500 mg total) by mouth 2 (two) times daily as needed.   30 tablet   0     BP 136/94  Pulse 92  Temp(Src) 98 F (36.7 C) (Oral)  SpO2 97%  LMP 09/03/2012  Physical Exam  Nursing note and vitals reviewed. Constitutional: She appears well-developed and well-nourished. No distress.  HENT:  Head: Normocephalic and atraumatic.  Eyes: Conjunctivae are normal.  Neck: Normal range of motion.  Cardiovascular: Normal rate, regular rhythm and intact distal pulses.   Capillary refill <3 seconds  Pulmonary/Chest: Effort normal and breath sounds normal.  Musculoskeletal: Normal range of motion. She exhibits tenderness. She exhibits no edema.  Pain to palpation of right trapezius and right paraspinal muscles Full ROM of right shoulder  Neurological: She is alert. Coordination normal.  Sensation intact Strength 5/5 in right upper extremity  Skin: Skin is warm and dry. She is not diaphoretic.  No tenting of the skin  Psychiatric: She has a normal mood and affect.    ED Course  Procedures (including critical care time)  DIAGNOSTIC  STUDIES: Oxygen Saturation is 97% on room air, normal by my interpretation.    COORDINATION OF CARE: 3:54 PM-Informed pt that imaging ruled out fracture and that pain is due to a trapezius strain.  Discussed treatment plan which includes anti-inflammatories for 2 weeks, and muscle relaxants and pain medications as needed with pt at bedside and pt agreed to plan.    Labs Reviewed - No data to display  Dg Shoulder Right  10/22/2012   *RADIOLOGY REPORT*  Clinical Data: Pain after moving furniture.  RIGHT SHOULDER - 2+ VIEW  Comparison: None.  Findings: No fracture or dislocation.  No abnormal soft tissue calcifications.  Visualized lungs clear.  IMPRESSION: Negative.   Original Report Authenticated By: Lacy Duverney, M.D.   1. Trapezius strain, right, initial encounter     MDM  Teresa [name] presents with Hx and PE consistent with strain of trapezius with resulting pain in the right shoulder and right paraspinal muscles.  Patient X-Ray negative for obvious fracture or dislocation. I personally reviewed the imaging tests through PACS system.   I reviewed available ER/hospitalization records through the EMR.  Pain managed in ED. Pt advised to follow up with orthopedics if symptoms persist for possibility of missed fracture diagnosis.Conservative therapy recommended and discussed. Patient will be dc home & is agreeable with above plan.  I have also discussed reasons to return immediately to the ER.  Patient expresses understanding and agrees with plan.  I personally performed the services described in this documentation, which was scribed in my presence. The recorded information has been reviewed and is accurate.    [name], PA-C 10/22/12 1614

## 2012-10-22 NOTE — ED Notes (Signed)
Family coming to pick her up.

## 2012-10-26 NOTE — ED Provider Notes (Signed)
Medical screening examination/treatment/procedure(s) were performed by non-physician practitioner and as supervising physician I was immediately available for consultation/collaboration.    Christopher J. Pollina, MD 10/26/12 1534 

## 2012-12-03 ENCOUNTER — Emergency Department (HOSPITAL_COMMUNITY)
Admission: EM | Admit: 2012-12-03 | Discharge: 2012-12-03 | Disposition: A | Discharge status: Home / Self Care | Payer: Medicaid Other | Attending: Emergency Medicine | Admitting: Emergency Medicine

## 2012-12-03 ENCOUNTER — Encounter (HOSPITAL_COMMUNITY): Payer: Self-pay | Admitting: Cardiology

## 2012-12-03 DIAGNOSIS — Z8619 Personal history of other infectious and parasitic diseases: Secondary | ICD-10-CM | POA: Insufficient documentation

## 2012-12-03 DIAGNOSIS — Z87440 Personal history of urinary (tract) infections: Secondary | ICD-10-CM | POA: Insufficient documentation

## 2012-12-03 DIAGNOSIS — M545 Low back pain, unspecified: Secondary | ICD-10-CM | POA: Insufficient documentation

## 2012-12-03 DIAGNOSIS — F17200 Nicotine dependence, unspecified, uncomplicated: Secondary | ICD-10-CM | POA: Insufficient documentation

## 2012-12-03 DIAGNOSIS — Z8659 Personal history of other mental and behavioral disorders: Secondary | ICD-10-CM | POA: Insufficient documentation

## 2012-12-03 DIAGNOSIS — J45909 Unspecified asthma, uncomplicated: Secondary | ICD-10-CM | POA: Insufficient documentation

## 2012-12-03 MED ORDER — NAPROXEN 500 MG PO TABS
500.0000 mg | ORAL_TABLET | Freq: Two times a day (BID) | ORAL | Status: DC
Start: 2012-12-03 — End: 2013-01-16

## 2012-12-03 MED ORDER — HYDROCODONE-ACETAMINOPHEN 5-325 MG PO TABS
2.0000 | ORAL_TABLET | Freq: Once | ORAL | Status: AC
  Administered 2012-12-03: 2 via ORAL
  Filled 2012-12-03: qty 2

## 2012-12-03 MED ORDER — METHOCARBAMOL 500 MG PO TABS: 500.0000 mg | ORAL_TABLET | Freq: Once | ORAL | Status: DC

## 2012-12-03 MED ORDER — HYDROCODONE-ACETAMINOPHEN 5-325 MG PO TABS: 1.0000 | ORAL_TABLET | Freq: Four times a day (QID) | ORAL | Status: DC | PRN

## 2012-12-03 MED ORDER — METHOCARBAMOL 500 MG PO TABS: 500.0000 mg | ORAL_TABLET | Freq: Two times a day (BID) | ORAL | Status: DC

## 2012-12-03 NOTE — ED Notes (Signed)
Pt reports lower back pain that radiates up her spine and into her left side.  Pt reports moving yesterday and thinks she has inflamed disc.  Pt states he was here for shoulder pain and was given muscle relaxer and vicoden.  She took 2 yesterday and p[ain eased some.  This morning she states she couldn't get out of the bed.  Pt alert oriented X4

## 2012-12-03 NOTE — ED Notes (Signed)
Pt reports lower back pain over the past couple of days. States that she recently moved and inflamed her disc. Denies any injury. Sensation intact.

## 2012-12-03 NOTE — ED Provider Notes (Signed)
CSN: [id number]     Arrival date & time 12/03/12  1149 History     First MD Initiated Contact with Patient 12/03/12 1224     Chief Complaint  Patient presents with  . Back Pain   (Consider location/radiation/quality/duration/timing/severity/associated sxs/prior Treatment) HPI Comments: Patient presents with a chief complaint of lower back pain.  She reports that she has a history of Degenerative Disc Disease and has chronic back pain.  However, the pain worsened yesterday after she moved.  She reports that she was lifting heavy boxes and putting together furniture.  Pain is located in the lower back and does not radiate.  She denies any numbness or tingling.  Denies bowel or bladder incontinence.  No fever or chills.  She has taken Hydrocodone that she had left over from a previous injury for the pain, which she reports has helped.  However, she reports that she only has two more left.  Patient is a 22 y.o. female presenting with back pain. The history is provided by the patient.  Back Pain Worsened by:  Bending and ambulation Associated symptoms: no abdominal pain, no bladder incontinence, no bowel incontinence, no fever, no numbness, no paresthesias, no tingling and no weakness     Past Medical History  Diagnosis Date  . Asthma     childhood /grew out of it  . ADHD (attention deficit hyperactivity disorder)   . Urinary tract infection   . Depression   . Trichomonas    Past Surgical History  Procedure Laterality Date  . Wisdom tooth extraction    . Induced abortion     Family History  Problem Relation Age of Onset  . Diabetes Mother   . Hypertension Mother   . Diabetes Maternal Grandfather   . Anesthesia problems Neg Hx   . Hypotension Neg Hx   . Malignant hyperthermia Neg Hx   . Pseudochol deficiency Neg Hx    History  Substance Use Topics  . Smoking status: Current Every Day Smoker -- 0.25 packs/day    Types: Cigarettes  . Smokeless tobacco: Never Used  . Alcohol  Use: 0.0 oz/week     Comment: denies use currently but was a heavy user   OB History   Grav Para Term Preterm Abortions TAB SAB Ect Mult Living   3 1 1  0 2 1 1  0 0 1     Review of Systems  Constitutional: Negative for fever.  Gastrointestinal: Negative for abdominal pain and bowel incontinence.  Genitourinary: Negative for bladder incontinence.  Musculoskeletal: Positive for back pain.  Neurological: Negative for tingling, weakness, numbness and paresthesias.  All other systems reviewed and are negative.    Allergies  Other and Tramadol  Home Medications   Current Outpatient Rx  Name  Route  Sig  Dispense  Refill  . HYDROcodone-acetaminophen (NORCO/VICODIN) 5-325 MG per tablet   Oral   Take 1 tablet by mouth every 6 (six) hours as needed for pain (Take 1 - 2 tablets every 4 - 6 hours.).   7 tablet   0    BP 123/76  Pulse 88  Temp(Src) 98.3 F (36.8 C) (Oral)  Resp 20  SpO2 100% Physical Exam  Nursing note and vitals reviewed. Constitutional: She is oriented to [place], place, and time. She appears well-developed and well-nourished. No distress.  HENT:  Head: Normocephalic and atraumatic.  Neck: Normal range of motion and full passive range of motion without pain. Neck supple.  Cardiovascular: Normal rate, regular rhythm and intact  distal pulses.   Pulmonary/Chest: Effort normal and breath sounds normal.  Musculoskeletal:       Cervical back: She exhibits normal range of motion, no tenderness, no bony tenderness and no pain.       Thoracic back: She exhibits no tenderness, no bony tenderness and no pain.       Lumbar back: She exhibits tenderness, bony tenderness and pain. She exhibits no spasm and normal pulse.       Right foot: She exhibits no swelling.       Left foot: She exhibits no swelling.  Bilateral lower extremities nontender without color change, baseline range of motion of extremities with intact distal pulses.  Pt has increased pain w ROM of lumbar  spine. Pain w ambulation, no sign of ataxia.  Neurological: She is alert and oriented to [place], place, and time. She has normal strength and normal reflexes. No sensory deficit. Gait (no ataxia, slowed and hunched d/t pain ) abnormal.  Sensation at baseline for light touch in all 4 distal extremities, motor symmetric & bilateral 5/5 (hips: abduction, adduction, flexion; knee: flexion & extension; foot: dorsiflexion, plantar flexion, toes: dorsi flexion) Patellar & ankle reflexes intact.   Skin: Skin is warm and dry. She is not diaphoretic.  Psychiatric: She has a normal mood and affect.    ED Course   Procedures (including critical care time)  Labs Reviewed - No data to display No results found. No diagnosis found.  MDM  Patient with back pain.  No neurological deficits and normal neuro exam.  Patient can walk but states is painful.  No loss of bowel or bladder control.  No concern for cauda equina.  No fever, night sweats, weight loss, h/o cancer, IVDU.  RICE protocol and pain medicine indicated and discussed with patient.   Pascal Lux [place], PA-C 12/03/12 [phone]

## 2012-12-04 NOTE — ED Provider Notes (Signed)
Medical screening examination/treatment/procedure(s) were performed by non-physician practitioner and as supervising physician I was immediately available for consultation/collaboration.  Talyn Dessert B. Bernette Mayers, MD 12/04/12 [phone]

## 2012-12-10 ENCOUNTER — Inpatient Hospital Stay (HOSPITAL_COMMUNITY)
Admission: AD | Admit: 2012-12-10 | Discharge: 2012-12-10 | Disposition: A | Discharge status: Home / Self Care | Payer: Medicaid Other | Source: Ambulatory Visit | Attending: Family Medicine | Admitting: Family Medicine

## 2012-12-10 ENCOUNTER — Encounter (HOSPITAL_COMMUNITY): Payer: Self-pay | Admitting: *Deleted

## 2012-12-10 DIAGNOSIS — L293 Anogenital pruritus, unspecified: Secondary | ICD-10-CM | POA: Insufficient documentation

## 2012-12-10 DIAGNOSIS — N898 Other specified noninflammatory disorders of vagina: Secondary | ICD-10-CM

## 2012-12-10 DIAGNOSIS — N949 Unspecified condition associated with female genital organs and menstrual cycle: Secondary | ICD-10-CM | POA: Insufficient documentation

## 2012-12-10 HISTORY — DX: Unspecified fracture of shaft of unspecified fibula, initial encounter for closed fracture: S82.409A

## 2012-12-10 HISTORY — DX: Reserved for concepts with insufficient information to code with codable children: IMO0002

## 2012-12-10 HISTORY — DX: Unspecified abnormal cytological findings in specimens from cervix uteri: R87.619

## 2012-12-10 LAB — POCT PREGNANCY, URINE: Preg Test, Ur: NEGATIVE

## 2012-12-10 LAB — WET PREP, GENITAL

## 2012-12-10 NOTE — MAU Note (Signed)
Odor first noted 4 days ago, started itching during the night.

## 2012-12-10 NOTE — MAU Provider Note (Signed)
History     CSN: [id number]  Arrival date and time: 12/10/12 1018   First Provider Initiated Contact with Patient 12/10/12 1118      Chief Complaint  Patient presents with  . Possible Pregnancy   HPI  Pt is not pregnant and is not using contraception. Pt does not want to be pregnant. Pt c/o of vaginal odor for 4 days and vaginal itching since last night.  Pt denies abdominal pain, UTI symptoms.  Pt denies constipation or diarrhea.  RN: Odor first noted 4 days ago, started itching during the night.      Past Medical History  Diagnosis Date  . Asthma     childhood /grew out of it  . ADHD (attention deficit hyperactivity disorder)   . Urinary tract infection   . Trichomonas   . Fractured fibula     left  . Degenerative disk disease   . Abnormal Pap smear   . Depression     denies    Past Surgical History  Procedure Laterality Date  . Wisdom tooth extraction    . Induced abortion    . Dilation and curettage of uterus      Family History  Problem Relation Age of Onset  . Diabetes Mother   . Hypertension Mother   . Heart disease Mother   . Diabetes Maternal Grandfather   . Anesthesia problems Neg Hx   . Hypotension Neg Hx   . Malignant hyperthermia Neg Hx   . Pseudochol deficiency Neg Hx   . Heart disease Maternal Grandmother     chf    History  Substance Use Topics  . Smoking status: Current Every Day Smoker -- 0.25 packs/day for 6 years    Types: Cigarettes  . Smokeless tobacco: Never Used  . Alcohol Use: 0.0 oz/week     Comment: denies use currently but was a heavy user    Allergies:  Allergies  Allergen Reactions  . Other Anaphylaxis    Grapes, strawberries, bananas, pineapple, oranges  . Tramadol Nausea And Vomiting    Prescriptions prior to admission  Medication Sig Dispense Refill  . HYDROcodone-acetaminophen (NORCO/VICODIN) 5-325 MG per tablet Take 1 tablet by mouth every 6 (six) hours as needed for pain (Take 1 - 2 tablets every 4 - 6  hours.).  7 tablet  0  . HYDROcodone-acetaminophen (NORCO/VICODIN) 5-325 MG per tablet Take 1-2 tablets by mouth every 6 (six) hours as needed for pain.  15 tablet  0  . methocarbamol (ROBAXIN) 500 MG tablet Take 1 tablet (500 mg total) by mouth 2 (two) times daily.  20 tablet  0  . naproxen (NAPROSYN) 500 MG tablet Take 1 tablet (500 mg total) by mouth 2 (two) times daily.  30 tablet  0    Review of Systems  Constitutional: Negative for fever and chills.  Gastrointestinal: Negative for nausea, vomiting, abdominal pain, diarrhea and constipation.  Genitourinary: Negative for dysuria and urgency.   Physical Exam   Blood pressure 117/74, pulse 86, temperature 98.4 F (36.9 C), temperature source Oral, resp. rate 16, height 5\' 3"  (1.6 m), weight 122.29 kg (269 lb 9.6 oz), SpO2 98.00%.  Physical Exam  Nursing note and vitals reviewed. Constitutional: She is oriented to [place], place, and time. She appears well-developed and well-nourished. No distress.  HENT:  Head: Normocephalic.  Eyes: Pupils are equal, round, and reactive to light.  Neck: Normal range of motion. Neck supple.  Cardiovascular: Normal rate.   Respiratory: Effort normal.  GI:  Soft. She exhibits no distension. There is no tenderness. There is no rebound.  Genitourinary:  Left labia slightly edematous- no lesions or excoriations or fissures noted; vaginal mucosa pale pin; sm- mod amount of watery white discharge in vault; cervix clean, NT Bimanual without palpable enlargement or tenderness  Musculoskeletal: Normal range of motion.  Neurological: She is alert and oriented to [place], place, and time.  Skin: Skin is warm and dry.  Psychiatric: She has a normal mood and affect.    MAU Course  Procedures Results for orders placed during the hospital encounter of 12/10/12 (from the past 24 hour(s))  POCT PREGNANCY, URINE     Status: None   Collection Time    12/10/12 10:50 AM      Result Value Range   Preg Test, Ur  NEGATIVE  NEGATIVE  WET PREP, GENITAL     Status: Abnormal   Collection Time    12/10/12 11:45 AM      Result Value Range   Yeast Wet Prep HPF POC NONE SEEN  NONE SEEN   Trich, Wet Prep NONE SEEN  NONE SEEN   Clue Cells Wet Prep HPF POC FEW (*) NONE SEEN   WBC, Wet Prep HPF POC FEW (*) NONE SEEN   Wet prep results called to pt on phone- pt had to leave before results available  Assessment and Plan  Vaginal discharge and odor Normal wet prep F/u with GYN clinic for IUP placement  Sybil Shrader 12/10/2012, 11:19 AM

## 2012-12-10 NOTE — MAU Note (Signed)
Pt needing to pick up child at daycare; Chase Picket NP ok with that.  Will call results.

## 2012-12-10 NOTE — MAU Note (Signed)
NP called results

## 2012-12-10 NOTE — MAU Provider Note (Signed)
Chart reviewed and agree with management and plan.  

## 2012-12-10 NOTE — MAU Note (Signed)
Patient states she has had vaginal itching with odor, no discharge. Denies pain or bleeding.

## 2012-12-11 ENCOUNTER — Encounter: Payer: Self-pay | Admitting: Nurse Practitioner

## 2013-01-16 ENCOUNTER — Emergency Department (HOSPITAL_COMMUNITY): Payer: Medicaid Other

## 2013-01-16 ENCOUNTER — Encounter (HOSPITAL_COMMUNITY): Payer: Self-pay | Admitting: Emergency Medicine

## 2013-01-16 ENCOUNTER — Emergency Department (HOSPITAL_COMMUNITY)
Admission: EM | Admit: 2013-01-16 | Discharge: 2013-01-16 | Disposition: A | Discharge status: Home / Self Care | Payer: Medicaid Other | Attending: Emergency Medicine | Admitting: Emergency Medicine

## 2013-01-16 DIAGNOSIS — Z8659 Personal history of other mental and behavioral disorders: Secondary | ICD-10-CM | POA: Insufficient documentation

## 2013-01-16 DIAGNOSIS — Z79899 Other long term (current) drug therapy: Secondary | ICD-10-CM | POA: Insufficient documentation

## 2013-01-16 DIAGNOSIS — IMO0002 Reserved for concepts with insufficient information to code with codable children: Secondary | ICD-10-CM | POA: Insufficient documentation

## 2013-01-16 DIAGNOSIS — Y939 Activity, unspecified: Secondary | ICD-10-CM | POA: Insufficient documentation

## 2013-01-16 DIAGNOSIS — J45909 Unspecified asthma, uncomplicated: Secondary | ICD-10-CM | POA: Insufficient documentation

## 2013-01-16 DIAGNOSIS — S63501A Unspecified sprain of right wrist, initial encounter: Secondary | ICD-10-CM

## 2013-01-16 DIAGNOSIS — Y929 Unspecified place or not applicable: Secondary | ICD-10-CM | POA: Insufficient documentation

## 2013-01-16 DIAGNOSIS — Z8739 Personal history of other diseases of the musculoskeletal system and connective tissue: Secondary | ICD-10-CM | POA: Insufficient documentation

## 2013-01-16 DIAGNOSIS — F17200 Nicotine dependence, unspecified, uncomplicated: Secondary | ICD-10-CM | POA: Insufficient documentation

## 2013-01-16 DIAGNOSIS — Z8619 Personal history of other infectious and parasitic diseases: Secondary | ICD-10-CM | POA: Insufficient documentation

## 2013-01-16 DIAGNOSIS — S63509A Unspecified sprain of unspecified wrist, initial encounter: Secondary | ICD-10-CM | POA: Insufficient documentation

## 2013-01-16 DIAGNOSIS — Z87440 Personal history of urinary (tract) infections: Secondary | ICD-10-CM | POA: Insufficient documentation

## 2013-01-16 DIAGNOSIS — Y990 Civilian activity done for income or pay: Secondary | ICD-10-CM | POA: Insufficient documentation

## 2013-01-16 MED ORDER — IBUPROFEN 600 MG PO TABS: 600.0000 mg | ORAL_TABLET | Freq: Four times a day (QID) | ORAL | Status: DC | PRN

## 2013-01-16 MED ORDER — IBUPROFEN 400 MG PO TABS
600.0000 mg | ORAL_TABLET | Freq: Once | ORAL | Status: AC
  Administered 2013-01-16: 600 mg via ORAL
  Filled 2013-01-16 (×2): qty 1

## 2013-01-16 NOTE — ED Notes (Signed)
Pt. Stated, i hit my hand yesterday at work.  Rt. Hand and wrist swollen

## 2013-01-16 NOTE — ED Provider Notes (Signed)
CSN: [id number]     Arrival date & time 01/16/13  1033 History  This chart was scribed for non-physician practitioner Rhea Bleacher PA-C working with Roney Marion, MD by Leone Payor, ED Scribe. This patient was seen in room TR10C/TR10C and the patient's care was started at 1033.    Chief Complaint  Patient presents with  . Hand Pain    The history is provided by the patient. No language interpreter was used.    HPI Comments: Teresa [name] is a 22 y.o. female who presents to the Emergency Department complaining of constant right wrist pain that began 2 days ago. Pt states she used the affected hand to help her stand up and she believes her wrist bended in a way that caused cramping pain. She states she tried to stretch the area but it did not ease the pain. She reports hitting the same wrist on something at work yesterday which worsened her pain. She reports associated tingling sensations and swelling. Aggravating factors include movement and touch. Nothing alleviates the pain. She denies numbness, weakness.    Past Medical History  Diagnosis Date  . Asthma     childhood /grew out of it  . ADHD (attention deficit hyperactivity disorder)   . Urinary tract infection   . Trichomonas   . Fractured fibula     left  . Degenerative disk disease   . Abnormal Pap smear   . Depression     denies   Past Surgical History  Procedure Laterality Date  . Wisdom tooth extraction    . Induced abortion    . Dilation and curettage of uterus     Family History  Problem Relation Age of Onset  . Diabetes Mother   . Hypertension Mother   . Heart disease Mother   . Diabetes Maternal Grandfather   . Anesthesia problems Neg Hx   . Hypotension Neg Hx   . Malignant hyperthermia Neg Hx   . Pseudochol deficiency Neg Hx   . Heart disease Maternal Grandmother     chf   History  Substance Use Topics  . Smoking status: Current Every Day Smoker -- 0.25 packs/day for 6 years    Types: Cigarettes  .  Smokeless tobacco: Never Used  . Alcohol Use: 0.0 oz/week     Comment: denies use currently but was a heavy user   OB History   Grav Para Term Preterm Abortions TAB SAB Ect Mult Living   3 1 1  0 2 1 1  0 0 1     Review of Systems  Constitutional: Positive for activity change.  HENT: Negative for neck pain.   Musculoskeletal: Positive for arthralgias (right wrist pain). Negative for back pain and joint swelling.  Skin: Negative for wound.  Neurological: Positive for numbness (tingling). Negative for weakness.    Allergies  Other and Tramadol  Home Medications   Current Outpatient Rx  Name  Route  Sig  Dispense  Refill  . HYDROcodone-acetaminophen (NORCO/VICODIN) 5-325 MG per tablet   Oral   Take 1 tablet by mouth every 6 (six) hours as needed for pain (Take 1 - 2 tablets every 4 - 6 hours.).   7 tablet   0   . HYDROcodone-acetaminophen (NORCO/VICODIN) 5-325 MG per tablet   Oral   Take 1-2 tablets by mouth every 6 (six) hours as needed for pain.   15 tablet   0   . methocarbamol (ROBAXIN) 500 MG tablet   Oral   Take  1 tablet (500 mg total) by mouth 2 (two) times daily.   20 tablet   0   . naproxen (NAPROSYN) 500 MG tablet   Oral   Take 1 tablet (500 mg total) by mouth 2 (two) times daily.   30 tablet   0    BP 111/68  Pulse 73  Temp(Src) 98.3 F (36.8 C)  Resp 17  Ht 5\' 2"  (1.575 m)  Wt 289 lb 6 oz (131.26 kg)  BMI 52.91 kg/m2  SpO2 98%  LMP 12/21/2012 Physical Exam  Nursing note and vitals reviewed. Constitutional: She appears well-developed and well-nourished. No distress.  HENT:  Head: Normocephalic and atraumatic.  Right Ear: Tympanic membrane, external ear and ear canal normal.  Left Ear: Tympanic membrane, external ear and ear canal normal.  Nose: Nose normal.  Mouth/Throat: Uvula is midline, oropharynx is clear and moist and mucous membranes are normal.  Eyes: Conjunctivae, EOM and lids are normal. Pupils are equal, round, and reactive to light.  Right eye exhibits no nystagmus. Left eye exhibits no nystagmus.  Neck: Normal range of motion. Neck supple.  Cardiovascular: Normal rate and regular rhythm.   Pulmonary/Chest: Effort normal and breath sounds normal. No stridor.  Abdominal: Soft. There is no tenderness.  Musculoskeletal: Normal range of motion. She exhibits tenderness.       Cervical back: She exhibits normal range of motion, no tenderness and no bony tenderness.  Generalized tenderness over ulnar aspect of the right wrist. No anatomical snuffbox tenderness. No right elbow tenderness. Full ROM of the right elbow without pain.   Neurological: She is alert. She has normal strength and normal reflexes. No cranial nerve deficit or sensory deficit. She displays a negative Romberg sign. Coordination and gait normal. GCS eye subscore is 4. GCS verbal subscore is 5. GCS motor subscore is 6.  Skin: Skin is warm and dry.  Psychiatric: She has a normal mood and affect.    ED Course  Procedures (including critical care time)  DIAGNOSTIC STUDIES: Oxygen Saturation is 98% on RA, normal by my interpretation.    COORDINATION OF CARE: 11:13 AM Will order an XRAY of the right wrist and give ibuprofen for pain. Discussed treatment plan with pt at bedside and pt agreed to plan.   Labs Review Labs Reviewed - No data to display Imaging Review Dg Wrist Complete Right  01/16/2013   CLINICAL DATA:  Injury  EXAM: RIGHT WRIST - COMPLETE 3+ VIEW  COMPARISON:  None.  FINDINGS: No acute fracture. No dislocation.  IMPRESSION: No acute bony pathology.   Electronically Signed   By: Maryclare Bean M.D.   On: 01/16/2013 11:46   Vital signs reviewed and are as follows: Filed Vitals:   01/16/13 1101  BP: 111/68  Pulse: 73  Temp: 98.3 F (36.8 C)  Resp: 17   Patient was counseled on RICE protocol and told to rest injury, use ice for no longer than 15 minutes every hour, compress the area, and elevate above the level of their heart as much as possible to  reduce swelling.  Questions answered.  Patient verbalized understanding.    Wrist splint by nurse.  Patient urged to followup with orthopedic referral if not improved in one week.    MDM   1. Wrist sprain, right, initial encounter    Patient with right wrist pain after injury. X-rays negative. No anatomic snuffbox tenderness to suggest occult navicular fracture. Conservative management indicated.   I personally performed the services described in this  documentation, which was scribed in my presence. The recorded information has been reviewed and is accurate.   Renne Crigler, PA-C 01/16/13 1208

## 2013-01-17 NOTE — ED Provider Notes (Signed)
Medical screening examination/treatment/procedure(s) were performed by non-physician practitioner and as supervising physician I was immediately available for consultation/collaboration.  Pailyn [place] J Kawehi Hostetter, MD 01/17/13 0922 

## 2013-01-27 ENCOUNTER — Ambulatory Visit: Payer: Medicaid Other | Admitting: Nurse Practitioner

## 2013-03-03 ENCOUNTER — Inpatient Hospital Stay (HOSPITAL_COMMUNITY): Payer: Medicaid Other

## 2013-03-03 ENCOUNTER — Inpatient Hospital Stay (HOSPITAL_COMMUNITY)
Admission: AD | Admit: 2013-03-03 | Discharge: 2013-03-03 | Disposition: A | Discharge status: Home / Self Care | Payer: Medicaid Other | Source: Ambulatory Visit | Attending: Obstetrics & Gynecology | Admitting: Obstetrics & Gynecology

## 2013-03-03 DIAGNOSIS — N898 Other specified noninflammatory disorders of vagina: Secondary | ICD-10-CM | POA: Insufficient documentation

## 2013-03-03 DIAGNOSIS — N926 Irregular menstruation, unspecified: Secondary | ICD-10-CM

## 2013-03-03 DIAGNOSIS — L293 Anogenital pruritus, unspecified: Secondary | ICD-10-CM | POA: Insufficient documentation

## 2013-03-03 DIAGNOSIS — N970 Female infertility associated with anovulation: Secondary | ICD-10-CM

## 2013-03-03 LAB — WET PREP, GENITAL
Trich, Wet Prep: NONE SEEN
Yeast Wet Prep HPF POC: NONE SEEN

## 2013-03-03 LAB — POCT PREGNANCY, URINE: Preg Test, Ur: NEGATIVE

## 2013-03-03 LAB — URINALYSIS, ROUTINE W REFLEX MICROSCOPIC
Ketones, ur: NEGATIVE mg/dL
Nitrite: NEGATIVE
Protein, ur: 30 mg/dL — AB
Urobilinogen, UA: 0.2 mg/dL (ref 0.0–1.0)

## 2013-03-03 LAB — CBC
HCT: 36.7 % (ref 36.0–46.0)
Hemoglobin: 12.4 g/dL (ref 12.0–15.0)
MCH: 29.3 pg (ref 26.0–34.0)
MCHC: 33.8 g/dL (ref 30.0–36.0)
MCV: 86.8 fL (ref 78.0–100.0)
RDW: 13.4 % (ref 11.5–15.5)

## 2013-03-03 LAB — URINE MICROSCOPIC-ADD ON

## 2013-03-03 MED ORDER — LEVONORGEST-ETH ESTRAD 91-DAY 0.15-0.03 &0.01 MG PO TABS: 1.0000 | ORAL_TABLET | Freq: Every day | ORAL | Status: DC

## 2013-03-03 NOTE — MAU Note (Signed)
No period Aug and Sept.  Started bleeding Oct 10, bled for 2 wks, off a day, then has been heavy/light / heavy since.  Passing clots.

## 2013-03-03 NOTE — MAU Provider Note (Signed)
Chief Complaint: Vaginal Bleeding   First Provider Initiated Contact with Patient 03/03/13 1337     SUBJECTIVE HPI: Teresa [name] is a 22 y.o. [id number] who presents to maternity admissions reporting vaginal bleeding x3 weeks with burning/itching in vagina.  Pt reports she may have PCOS, this has been mentioned to her in ED visit because she has hx of irregular menses.  She reports this is the first time she has had bleeding last this long.  She does report recent weight gain.  She denies abdominal pain, urinary symptoms, h/a, dizziness, n/v, or fever/chills.    Past Medical History  Diagnosis Date  . Asthma     childhood /grew out of it  . ADHD (attention deficit hyperactivity disorder)   . Urinary tract infection   . Trichomonas   . Fractured fibula     left  . Degenerative disk disease   . Abnormal Pap smear   . Depression     denies   Past Surgical History  Procedure Laterality Date  . Wisdom tooth extraction    . Induced abortion    . Dilation and curettage of uterus     History   Social History  . Marital Status: Single    Spouse Name: N/A    Number of Children: N/A  . Years of Education: N/A   Occupational History  . Not on file.   Social History Main Topics  . Smoking status: Current Every Day Smoker -- 0.25 packs/day for 6 years    Types: Cigarettes  . Smokeless tobacco: Never Used  . Alcohol Use: 0.0 oz/week     Comment: denies use currently but was a heavy user  . Drug Use: No  . Sexual Activity: Yes    Birth Control/ Protection: None   Other Topics Concern  . Not on file   Social History Narrative  . No narrative on file   No current facility-administered medications on file prior to encounter.   No current outpatient prescriptions on file prior to encounter.   Allergies  Allergen Reactions  . Other Anaphylaxis    Grapes, strawberries, bananas, pineapple, oranges, nuts  . Tramadol Nausea And Vomiting    ROS: Pertinent items in  HPI  OBJECTIVE Blood pressure 108/68, pulse 88, temperature 98.2 F (36.8 C), temperature source Oral, resp. rate 18, height 5\' 1"  (1.549 m), weight 127.914 kg (282 lb), last menstrual period 02/19/2013. GENERAL: Well-developed, well-nourished female in no acute distress.  HEENT: Normocephalic HEART: normal rate RESP: normal effort ABDOMEN: Soft, non-tender EXTREMITIES: Nontender, no edema NEURO: Alert and oriented Pelvic exam: Cervix pink, visually closed, without lesion, moderate amount dark red blood without clots, vaginal walls and external genitalia normal Bimanual exam: Cervix 0/long/high, firm, anterior, neg CMT, uterus nontender, nonenlarged, adnexa without tenderness, enlargement, or mass  LAB RESULTS Results for orders placed during the hospital encounter of 03/03/13 (from the past 24 hour(s))  URINALYSIS, ROUTINE W REFLEX MICROSCOPIC     Status: Abnormal   Collection Time    03/03/13 12:10 PM      Result Value Range   Color, Urine YELLOW  YELLOW   APPearance CLEAR  CLEAR   Specific Gravity, Urine >1.030 (*) 1.005 - 1.030   pH 6.0  5.0 - 8.0   Glucose, UA NEGATIVE  NEGATIVE mg/dL   Hgb urine dipstick LARGE (*) NEGATIVE   Bilirubin Urine NEGATIVE  NEGATIVE   Ketones, ur NEGATIVE  NEGATIVE mg/dL   Protein, ur 30 (*) NEGATIVE mg/dL  Urobilinogen, UA 0.2  0.0 - 1.0 mg/dL   Nitrite NEGATIVE  NEGATIVE   Leukocytes, UA NEGATIVE  NEGATIVE  URINE MICROSCOPIC-ADD ON     Status: None   Collection Time    03/03/13 12:10 PM      Result Value Range   Squamous Epithelial / LPF RARE  RARE   WBC, UA 0-2  <3 WBC/hpf   RBC / HPF 3-6  <3 RBC/hpf   Bacteria, UA RARE  RARE   Urine-Other MUCOUS PRESENT    POCT PREGNANCY, URINE     Status: None   Collection Time    03/03/13 12:15 PM      Result Value Range   Preg Test, Ur NEGATIVE  NEGATIVE  CBC     Status: None   Collection Time    03/03/13  1:39 PM      Result Value Range   WBC 6.6  4.0 - 10.5 K/uL   RBC 4.23  3.87 - 5.11  MIL/uL   Hemoglobin 12.4  12.0 - 15.0 g/dL   HCT 40.9  81.1 - 91.4 %   MCV 86.8  78.0 - 100.0 fL   MCH 29.3  26.0 - 34.0 pg   MCHC 33.8  30.0 - 36.0 g/dL   RDW 78.2  95.6 - 21.3 %   Platelets 275  150 - 400 K/uL  WET PREP, GENITAL     Status: Abnormal   Collection Time    03/03/13  1:50 PM      Result Value Range   Yeast Wet Prep HPF POC NONE SEEN  NONE SEEN   Trich, Wet Prep NONE SEEN  NONE SEEN   Clue Cells Wet Prep HPF POC FEW (*) NONE SEEN   WBC, Wet Prep HPF POC FEW (*) NONE SEEN    IMAGING US Transvaginal Non-ob  03/03/2013   CLINICAL DATA:  Intermittent bleeding x3 weeks  EXAM: TRANSVAGINAL ULTRASOUND OF PELVIS  TECHNIQUE: Transvaginal ultrasound examination of the pelvis was performed including evaluation of the uterus, ovaries, adnexal regions, and pelvic cul-de-sac.  COMPARISON:  None.  FINDINGS: Uterus  Measurements: None 0.5 x 4 x 4.7 cm. No fibroids or other mass visualized.  Endometrium  Thickness: 5 mm.  No focal abnormality visualized.  Right ovary  Measurements: 4.1 x 2.1 x 2.2 cm. Normal appearance/no adnexal mass.  Left ovary  Measurements: 3.7 x 2.5 x 1.8 cm. Normal appearance/no adnexal mass.  Other findings:  No free fluid  IMPRESSION: Normal study. No evidence of pelvic mass or other significant abnormality.   Electronically Signed   By: Salome Holmes M.D.   On: 03/03/2013 15:20   US Pelvis Complete  03/03/2013   CLINICAL DATA:  Intermittent bleeding x3 weeks  EXAM: TRANSABDOMINAL AND TRANSVAGINAL ULTRASOUND OF PELVIS  TECHNIQUE: Both transabdominal and transvaginal ultrasound examinations of the pelvis were performed. Transabdominal technique was performed for global imaging of the pelvis including uterus, ovaries, adnexal regions, and pelvic cul-de-sac. It was necessary to proceed with endovaginal exam following the transabdominal exam to visualize the uterus, and ovaries.  COMPARISON:  None  FINDINGS: Uterus  Measurements: Number 0.5 x 4 x 4.7 cm. No fibroids or  other mass visualized.  Endometrium  Thickness: 5 mm.  No focal abnormality visualized.  Right ovary  Measurements: 4.1 x 2.1 x 2.2 cm. Normal appearance/no adnexal mass.  Left ovary  Measurements: 3.7 x 2.5 x 1.8 cm . Normal appearance/no adnexal mass.  Other findings  No free fluid.  IMPRESSION: Unremarkable  pelvic ultrasound   Electronically Signed   By: Salome Holmes M.D.   On: 03/03/2013 15:03    ASSESSMENT 1. Irregular menses   2. Anovulatory bleeding     PLAN Discharge home Discussed treatment options for anovulatory bleeding, pt desires contraception for now OCPs prescribed (Seasonique per pt request) F/U in office with Dr Tamela Oddi  Return to MAU as needed        Medication List         Levonorgestrel-Ethinyl Estradiol 0.15-0.03 &0.01 MG tablet  Commonly known as:  AMETHIA,CAMRESE  Take 1 tablet by mouth daily.         Medication List         Levonorgestrel-Ethinyl Estradiol 0.15-0.03 &0.01 MG tablet  Commonly known as:  AMETHIA,CAMRESE  Take 1 tablet by mouth daily.       Follow-up Information   Schedule an appointment as soon as possible for a visit with Roseanna Rainbow, MD.   Specialty:  Obstetrics and Gynecology   Contact information:   [address] [place] [address] [phone]       Sharen Counter Certified Nurse-Midwife 03/03/2013  7:57 PM

## 2013-03-03 NOTE — MAU Note (Signed)
22 yo, G3P1, presents to MAU with c/o intermittent bleeding x 3 weeks; reports mild lower abdominal cramping. Unable to reports aggravating or alleviating factors for pain.  Patient is sexually active and denies use of contraception at this time. Reports vaginal itching and burning x 3 weeks. Denies vaginal discharge. Denies new sexual partner.

## 2013-03-04 LAB — GC/CHLAMYDIA PROBE AMP
CT Probe RNA: NEGATIVE
GC Probe RNA: NEGATIVE

## 2013-03-08 ENCOUNTER — Emergency Department (HOSPITAL_COMMUNITY)
Admission: EM | Admit: 2013-03-08 | Discharge: 2013-03-08 | Disposition: A | Discharge status: Home / Self Care | Payer: Medicaid Other | Attending: Emergency Medicine | Admitting: Emergency Medicine

## 2013-03-08 ENCOUNTER — Encounter (HOSPITAL_COMMUNITY): Payer: Self-pay | Admitting: Emergency Medicine

## 2013-03-08 DIAGNOSIS — S335XXA Sprain of ligaments of lumbar spine, initial encounter: Secondary | ICD-10-CM | POA: Insufficient documentation

## 2013-03-08 DIAGNOSIS — Y9389 Activity, other specified: Secondary | ICD-10-CM | POA: Insufficient documentation

## 2013-03-08 DIAGNOSIS — Y929 Unspecified place or not applicable: Secondary | ICD-10-CM | POA: Insufficient documentation

## 2013-03-08 DIAGNOSIS — F17200 Nicotine dependence, unspecified, uncomplicated: Secondary | ICD-10-CM | POA: Insufficient documentation

## 2013-03-08 DIAGNOSIS — Z8781 Personal history of (healed) traumatic fracture: Secondary | ICD-10-CM | POA: Insufficient documentation

## 2013-03-08 DIAGNOSIS — X500XXA Overexertion from strenuous movement or load, initial encounter: Secondary | ICD-10-CM | POA: Insufficient documentation

## 2013-03-08 DIAGNOSIS — S39012A Strain of muscle, fascia and tendon of lower back, initial encounter: Secondary | ICD-10-CM

## 2013-03-08 DIAGNOSIS — Z79899 Other long term (current) drug therapy: Secondary | ICD-10-CM | POA: Insufficient documentation

## 2013-03-08 DIAGNOSIS — Z8659 Personal history of other mental and behavioral disorders: Secondary | ICD-10-CM | POA: Insufficient documentation

## 2013-03-08 DIAGNOSIS — J45909 Unspecified asthma, uncomplicated: Secondary | ICD-10-CM | POA: Insufficient documentation

## 2013-03-08 DIAGNOSIS — Z8739 Personal history of other diseases of the musculoskeletal system and connective tissue: Secondary | ICD-10-CM | POA: Insufficient documentation

## 2013-03-08 DIAGNOSIS — Z87440 Personal history of urinary (tract) infections: Secondary | ICD-10-CM | POA: Insufficient documentation

## 2013-03-08 DIAGNOSIS — Z8619 Personal history of other infectious and parasitic diseases: Secondary | ICD-10-CM | POA: Insufficient documentation

## 2013-03-08 NOTE — ED Provider Notes (Signed)
CSN: [id number]     Arrival date & time 03/08/13  0913 History  This chart was scribed for non-physician practitioner Roxy Horseman, PA-C working with Flint Melter, MD by Leone Payor, ED Scribe. This patient was seen in room TR09C/TR09C and the patient's care was started at 0913.   Chief Complaint  Patient presents with  . Back Pain    The history is provided by the patient. No language interpreter was used.    HPI Comments: Teresa [name] is a 22 y.o. female with past medical history of degenerative disc disease who presents to the Emergency Department complaining of constant, unchanged low back pain that began 3 weeks ago. Pt states she was moving recently and was doing heavy lifting. Pt states she tried 600 mg Ibuprofen without relief. Nothing worsens or eases this pain. Pt denies fever, change in bowel or bladder function, numbness, weakness.    Past Medical History  Diagnosis Date  . Asthma     childhood /grew out of it  . ADHD (attention deficit hyperactivity disorder)   . Urinary tract infection   . Trichomonas   . Fractured fibula     left  . Degenerative disk disease   . Abnormal Pap smear   . Depression     denies   Past Surgical History  Procedure Laterality Date  . Wisdom tooth extraction    . Induced abortion    . Dilation and curettage of uterus     Family History  Problem Relation Age of Onset  . Diabetes Mother   . Hypertension Mother   . Heart disease Mother   . Diabetes Maternal Grandfather   . Anesthesia problems Neg Hx   . Hypotension Neg Hx   . Malignant hyperthermia Neg Hx   . Pseudochol deficiency Neg Hx   . Heart disease Maternal Grandmother     chf   History  Substance Use Topics  . Smoking status: Current Every Day Smoker -- 0.25 packs/day for 6 years    Types: Cigarettes  . Smokeless tobacco: Never Used  . Alcohol Use: 0.0 oz/week     Comment: denies use currently but was a heavy user   OB History   Grav Para Term Preterm  Abortions TAB SAB Ect Mult Living   3 1 1  0 2 1 1  0 0 1     Review of Systems A complete 10 system review of systems was obtained and all systems are negative except as noted in the HPI and PMH.   Allergies  Other and Tramadol  Home Medications   Current Outpatient Rx  Name  Route  Sig  Dispense  Refill  . ibuprofen (ADVIL,MOTRIN) 200 MG tablet   Oral   Take 400 mg by mouth every 8 (eight) hours as needed for mild pain or moderate pain.         . Levonorgestrel-Ethinyl Estradiol (AMETHIA,CAMRESE) 0.15-0.03 &0.01 MG tablet   Oral   Take 1 tablet by mouth daily.   1 Package   1   . Multiple Vitamin (MULTIVITAMIN WITH MINERALS) TABS tablet   Oral   Take 1 tablet by mouth daily.          BP 102/57  Pulse 84  Temp(Src) 98.7 F (37.1 C) (Oral)  Resp 18  Ht 5\' 1"  (1.549 m)  Wt 275 lb (124.739 kg)  BMI 51.99 kg/m2  SpO2 97%  LMP 02/19/2013 Physical Exam  Nursing note and vitals reviewed. Constitutional: She is oriented to  [place], place, and time. She appears well-developed and well-nourished. No distress.  HENT:  Head: Normocephalic and atraumatic.  Eyes: Conjunctivae and EOM are normal. Right eye exhibits no discharge. Left eye exhibits no discharge. No scleral icterus.  Neck: Normal range of motion. Neck supple. No tracheal deviation present.  Cardiovascular: Normal rate, regular rhythm and normal heart sounds.  Exam reveals no gallop and no friction rub.   No murmur heard. Pulmonary/Chest: Effort normal and breath sounds normal. No respiratory distress. She has no wheezes.  Abdominal: Soft. She exhibits no distension. There is no tenderness.  Musculoskeletal: Normal range of motion.  Lumbar paraspinal muscles tender to palpation, no bony tenderness, step-offs, or gross abnormality or deformity of spine, patient is able to ambulate, moves all extremities  Bilateral great toe extension intact Bilateral plantar/dorsiflexion intact  Neurological: She is alert and  oriented to [place], place, and time.  Sensation and strength intact bilaterally   Skin: Skin is warm and dry. She is not diaphoretic.  Psychiatric: She has a normal mood and affect. Her behavior is normal. Judgment and thought content normal.    ED Course  Procedures  DIAGNOSTIC STUDIES: Oxygen Saturation is 97% on RA, normal by my interpretation.    COORDINATION OF CARE: 9:48 AM Discussed treatment plan with pt at bedside and pt agreed to plan.  Labs Review Labs Reviewed - No data to display Imaging Review No results found.  EKG Interpretation   None       MDM   1. Lumbar strain, initial encounter    Patient with back pain.  No neurological deficits and normal neuro exam.  Patient can walk but states is painful.  No loss of bowel or bladder control.  No concern for cauda equina.  No fever, night sweats, weight loss, h/o cancer, IVDU.  RICE protocol indicated and discussed with patient.    I personally performed the services described in this documentation, which was scribed in my presence. The recorded information has been reviewed and is accurate.    Roxy Horseman, PA-C 03/08/13 1000

## 2013-03-08 NOTE — ED Notes (Signed)
Pt here c/o chronic back pain that is flared up due to moving recently with heavy lifting

## 2013-03-08 NOTE — ED Provider Notes (Signed)
Medical screening examination/treatment/procedure(s) were performed by non-physician practitioner and as supervising physician I was immediately available for consultation/collaboration.  Flint Melter, MD 03/08/13 [phone]

## 2013-05-14 ENCOUNTER — Encounter (HOSPITAL_COMMUNITY): Payer: Self-pay | Admitting: Emergency Medicine

## 2013-05-14 ENCOUNTER — Emergency Department (HOSPITAL_COMMUNITY)
Admission: EM | Admit: 2013-05-14 | Discharge: 2013-05-14 | Disposition: A | Discharge status: Home / Self Care | Payer: Medicaid Other | Attending: Emergency Medicine | Admitting: Emergency Medicine

## 2013-05-14 DIAGNOSIS — N949 Unspecified condition associated with female genital organs and menstrual cycle: Secondary | ICD-10-CM | POA: Insufficient documentation

## 2013-05-14 DIAGNOSIS — M545 Low back pain, unspecified: Secondary | ICD-10-CM

## 2013-05-14 DIAGNOSIS — N760 Acute vaginitis: Secondary | ICD-10-CM | POA: Insufficient documentation

## 2013-05-14 DIAGNOSIS — N938 Other specified abnormal uterine and vaginal bleeding: Secondary | ICD-10-CM | POA: Insufficient documentation

## 2013-05-14 DIAGNOSIS — R109 Unspecified abdominal pain: Secondary | ICD-10-CM | POA: Insufficient documentation

## 2013-05-14 DIAGNOSIS — N946 Dysmenorrhea, unspecified: Secondary | ICD-10-CM | POA: Insufficient documentation

## 2013-05-14 DIAGNOSIS — Z888 Allergy status to other drugs, medicaments and biological substances status: Secondary | ICD-10-CM | POA: Insufficient documentation

## 2013-05-14 DIAGNOSIS — N889 Noninflammatory disorder of cervix uteri, unspecified: Secondary | ICD-10-CM

## 2013-05-14 DIAGNOSIS — Z87440 Personal history of urinary (tract) infections: Secondary | ICD-10-CM | POA: Insufficient documentation

## 2013-05-14 DIAGNOSIS — A499 Bacterial infection, unspecified: Secondary | ICD-10-CM | POA: Insufficient documentation

## 2013-05-14 DIAGNOSIS — J45909 Unspecified asthma, uncomplicated: Secondary | ICD-10-CM | POA: Insufficient documentation

## 2013-05-14 DIAGNOSIS — F329 Major depressive disorder, single episode, unspecified: Secondary | ICD-10-CM | POA: Insufficient documentation

## 2013-05-14 DIAGNOSIS — F909 Attention-deficit hyperactivity disorder, unspecified type: Secondary | ICD-10-CM | POA: Insufficient documentation

## 2013-05-14 DIAGNOSIS — B9689 Other specified bacterial agents as the cause of diseases classified elsewhere: Secondary | ICD-10-CM | POA: Insufficient documentation

## 2013-05-14 DIAGNOSIS — Z2089 Contact with and (suspected) exposure to other communicable diseases: Secondary | ICD-10-CM | POA: Insufficient documentation

## 2013-05-14 DIAGNOSIS — F3289 Other specified depressive episodes: Secondary | ICD-10-CM | POA: Insufficient documentation

## 2013-05-14 DIAGNOSIS — R9389 Abnormal findings on diagnostic imaging of other specified body structures: Secondary | ICD-10-CM | POA: Insufficient documentation

## 2013-05-14 DIAGNOSIS — IMO0002 Reserved for concepts with insufficient information to code with codable children: Secondary | ICD-10-CM | POA: Insufficient documentation

## 2013-05-14 DIAGNOSIS — Z3202 Encounter for pregnancy test, result negative: Secondary | ICD-10-CM | POA: Insufficient documentation

## 2013-05-14 DIAGNOSIS — R110 Nausea: Secondary | ICD-10-CM | POA: Insufficient documentation

## 2013-05-14 DIAGNOSIS — F17200 Nicotine dependence, unspecified, uncomplicated: Secondary | ICD-10-CM | POA: Insufficient documentation

## 2013-05-14 LAB — URINALYSIS, ROUTINE W REFLEX MICROSCOPIC
Bilirubin Urine: NEGATIVE
Glucose, UA: NEGATIVE mg/dL
Ketones, ur: NEGATIVE mg/dL
Nitrite: NEGATIVE
Protein, ur: NEGATIVE mg/dL
Specific Gravity, Urine: 1.025 (ref 1.005–1.030)
Urobilinogen, UA: 0.2 mg/dL (ref 0.0–1.0)
pH: 5.5 (ref 5.0–8.0)

## 2013-05-14 LAB — CBC
HEMATOCRIT: 39.6 % (ref 36.0–46.0)
Hemoglobin: 13.5 g/dL (ref 12.0–15.0)
MCH: 29.5 pg (ref 26.0–34.0)
MCHC: 34.1 g/dL (ref 30.0–36.0)
MCV: 86.5 fL (ref 78.0–100.0)
Platelets: 311 10*3/uL (ref 150–400)
RBC: 4.58 MIL/uL (ref 3.87–5.11)
RDW: 13 % (ref 11.5–15.5)
WBC: 7.1 10*3/uL (ref 4.0–10.5)

## 2013-05-14 LAB — WET PREP, GENITAL
Trich, Wet Prep: NONE SEEN
Yeast Wet Prep HPF POC: NONE SEEN

## 2013-05-14 LAB — URINE MICROSCOPIC-ADD ON

## 2013-05-14 LAB — PREGNANCY, URINE: Preg Test, Ur: NEGATIVE

## 2013-05-14 MED ORDER — OXYCODONE-ACETAMINOPHEN 5-325 MG PO TABS
2.0000 | ORAL_TABLET | Freq: Once | ORAL | Status: AC
  Administered 2013-05-14: 2 via ORAL
  Filled 2013-05-14: qty 2

## 2013-05-14 MED ORDER — HYDROCODONE-ACETAMINOPHEN 5-325 MG PO TABS: 1.0000 | ORAL_TABLET | ORAL | Status: DC | PRN

## 2013-05-14 MED ORDER — METRONIDAZOLE 500 MG PO TABS: 500.0000 mg | ORAL_TABLET | Freq: Two times a day (BID) | ORAL | Status: DC

## 2013-05-14 MED ORDER — ONDANSETRON 4 MG PO TBDP
4.0000 mg | ORAL_TABLET | Freq: Once | ORAL | Status: AC
  Administered 2013-05-14: 4 mg via ORAL
  Filled 2013-05-14: qty 1

## 2013-05-14 NOTE — Discharge Instructions (Signed)
Call for a follow up appointment with a Family or Primary Care Provider.  Return if Symptoms worsen.   Take medication as prescribed.  Follow up with an OB/GYN for evaluation of your cervix and menstrual problems. Use condoms with each sexual encounter.

## 2013-05-14 NOTE — ED Provider Notes (Signed)
CSN: [id number]     Arrival date & time 05/14/13  1033 History   First MD Initiated Contact with Patient 05/14/13 1117     Chief Complaint  Patient presents with  . Back Pain   (Consider location/radiation/quality/duration/timing/severity/associated sxs/prior Treatment) HPI Comments: Teresa [name] is a 23 year-old female with a past medical history of degenerative disk disease, Asthma, Abnormal pap smear, presenting the Emergency Department with a chief complaint of lower back pain for 2 days and 3 weeks of abnormal vaginal bleeding.  She reports multiple episodes of menorrhagia in the past and was evaluated by an OB/GYN at [facility] in November for similar symptoms and prescribed birth control but never filled the prescription.  She denies vaginal discharge and reports last sexual intercourse was early December.  She reports occasional nausea without emesis.     The history is provided by the patient and medical records. No language interpreter was used.    Past Medical History  Diagnosis Date  . Asthma     childhood /grew out of it  . ADHD (attention deficit hyperactivity disorder)   . Urinary tract infection   . Trichomonas   . Fractured fibula     left  . Degenerative disk disease   . Abnormal Pap smear   . Depression     denies   Past Surgical History  Procedure Laterality Date  . Wisdom tooth extraction    . Induced abortion    . Dilation and curettage of uterus     Family History  Problem Relation Age of Onset  . Diabetes Mother   . Hypertension Mother   . Heart disease Mother   . Diabetes Maternal Grandfather   . Anesthesia problems Neg Hx   . Hypotension Neg Hx   . Malignant hyperthermia Neg Hx   . Pseudochol deficiency Neg Hx   . Heart disease Maternal Grandmother     chf   History  Substance Use Topics  . Smoking status: Current Every Day Smoker -- 0.25 packs/day for 6 years    Types: Cigarettes  . Smokeless tobacco: Never Used  . Alcohol Use: 0.0 oz/week       Comment: denies use currently but was a heavy user   OB History   Grav Para Term Preterm Abortions TAB SAB Ect Mult Living   3 1 1  0 2 1 1  0 0 1     Review of Systems  Constitutional: Negative for fever and chills.  Gastrointestinal: Positive for nausea and abdominal pain. Negative for vomiting, diarrhea, constipation and blood in stool.  Genitourinary: Positive for vaginal bleeding and menstrual problem. Negative for dysuria, hematuria, flank pain, vaginal discharge and difficulty urinating.  Musculoskeletal: Positive for back pain. Negative for gait problem, joint swelling, neck pain and neck stiffness.  Skin: Negative for rash.  Neurological: Negative for dizziness, syncope, light-headedness and headaches.    Allergies  Other; Tramadol; and Tomato  Home Medications   Current Outpatient Rx  Name  Route  Sig  Dispense  Refill  . ibuprofen (ADVIL,MOTRIN) 200 MG tablet   Oral   Take 400 mg by mouth every 8 (eight) hours as needed for mild pain or moderate pain.         . Multiple Vitamin (MULTIVITAMIN WITH MINERALS) TABS tablet   Oral   Take 1 tablet by mouth daily.          BP 102/31  Pulse 77  Temp(Src) 97.1 F (36.2 C) (Oral)  Resp 20  SpO2 100% Physical Exam  Nursing note and vitals reviewed. Constitutional: She appears well-developed and well-nourished. No distress.  Exam limited by patient's body habitus.    HENT:  Head: Normocephalic and atraumatic.  Eyes: No scleral icterus.  Neck: Normal range of motion. Neck supple.  Cardiovascular: Regular rhythm.   Pulmonary/Chest: Effort normal and breath sounds normal. Not tachypneic. She has no rales.  Abdominal: Soft. There is tenderness in the suprapubic area. There is no rebound, no guarding, no CVA tenderness, no tenderness at McBurney's point and negative Murphy's sign.  Genitourinary: Uterus is tender. Cervix exhibits no motion tenderness. Right adnexum displays tenderness. Left adnexum displays  tenderness. No erythema around the vagina. No foreign body around the vagina.    Minimal bleeding in the vaginal vault. Mild tenderness to palpation of the uterus.  Musculoskeletal: Normal range of motion.  No midline C-spine, T-spine, or L-spine tenderness with no step-offs, crepitus, or deformities noted. Paraspinal tenderness to bilateral lower L-spine.  Neurological: She is alert. No sensory deficit. She exhibits normal muscle tone. Coordination and gait normal.  Reflex Scores:      Patellar reflexes are 1+ on the right side and 1+ on the left side.      Achilles reflexes are 1+ on the right side and 1+ on the left side.   ED Course  Procedures (including critical care time) Labs Review Labs Reviewed  URINALYSIS, ROUTINE W REFLEX MICROSCOPIC - Abnormal; Notable for the following:    Hgb urine dipstick LARGE (*)    Leukocytes, UA SMALL (*)    All other components within normal limits  URINE MICROSCOPIC-ADD ON - Abnormal; Notable for the following:    Squamous Epithelial / LPF FEW (*)    Bacteria, UA FEW (*)    All other components within normal limits  GC/CHLAMYDIA PROBE AMP  WET PREP, GENITAL  CBC  PREGNANCY, URINE   Microscopic wet-mount exam shows clue cells, white blood cells.  Imaging Review No results found.  EKG Interpretation   None       MDM   1. Low back pain   2. Dysmenorrhea   3. Abnormal appearance of cervix   4. BV (bacterial vaginosis)    Pt with lower abdominal pain, was evaluated by an OB/GYN. Low back pain recreated with palpation and movement likely MS, no paresthesia, weakness, fever, loss of bowel function.  GU exam reveals a small 0.5x0.5 cm pink-white lesion to the cervix at the 2 o'clock, will have patient follow up with GYN.  Hbg: 13.5, no anemia. Wet prep with clue cells, will treat as out patient. Discussed lab results and treatment plan with the patient. Return precautions given. Reports understanding and no other concerns at this time.   Patient is stable for discharge at this time. Meds given in ED:  Medications  oxyCODONE-acetaminophen (PERCOCET/ROXICET) 5-325 MG per tablet 2 tablet (2 tablets Oral Given 05/14/13 1450)  ondansetron (ZOFRAN-ODT) disintegrating tablet 4 mg (4 mg Oral Given 05/14/13 1449)    Discharge Medication List as of 05/14/2013  3:49 PM     metroNIDAZOLE (FLAGYL) 500 MG tablet 2 times daily          Clabe SealLauren M Connie Hilgert, PA-C 05/18/13 1401

## 2013-05-14 NOTE — ED Notes (Signed)
Patient states she had been having period for 3 weeks non-stop and just quit 2 days ago.   Patient states she "thinks I've had a miscarriage".   Patient states she "messed up an overnight pad in one hour, but I didn't go to the doctor".

## 2013-05-14 NOTE — ED Notes (Signed)
Pt c/o lower back pain with radiation to upper area x 1 week; pt sts hx of chronic back issues and feels same

## 2013-05-15 LAB — GC/CHLAMYDIA PROBE AMP
CT Probe RNA: POSITIVE — AB
GC Probe RNA: NEGATIVE

## 2013-05-16 ENCOUNTER — Telehealth (HOSPITAL_COMMUNITY): Payer: Self-pay | Admitting: Emergency Medicine

## 2013-05-16 NOTE — ED Notes (Signed)
+  Chlamydia. Chart sent to EDP office for review. DHHS attached. 

## 2013-05-16 NOTE — ED Notes (Signed)
Patient has +Chlamydia. 

## 2013-05-19 NOTE — ED Notes (Signed)
Chart returned from EDP office  With orders written for Doxycycline 100 mg po BID x 7 days needs to be called to pharmacy.

## 2013-05-20 NOTE — ED Notes (Signed)
Patient informed of positive results after id'd x 2 and informed of need to notify partner to be treated.rxx called to [facility] [phone] by Purnell ShoemakerKaye PFM

## 2013-05-21 NOTE — ED Provider Notes (Signed)
Medical screening examination/treatment/procedure(s) were performed by non-physician practitioner and as supervising physician I was immediately available for consultation/collaboration.  EKG Interpretation   None        Shelda JakesScott W. Hikari Tripp, MD 05/21/13 1318

## 2013-06-03 ENCOUNTER — Encounter (HOSPITAL_COMMUNITY): Payer: Self-pay | Admitting: Emergency Medicine

## 2013-06-03 DIAGNOSIS — R197 Diarrhea, unspecified: Secondary | ICD-10-CM | POA: Insufficient documentation

## 2013-06-03 DIAGNOSIS — B9789 Other viral agents as the cause of diseases classified elsewhere: Secondary | ICD-10-CM | POA: Insufficient documentation

## 2013-06-03 DIAGNOSIS — R51 Headache: Secondary | ICD-10-CM | POA: Insufficient documentation

## 2013-06-03 DIAGNOSIS — F329 Major depressive disorder, single episode, unspecified: Secondary | ICD-10-CM | POA: Insufficient documentation

## 2013-06-03 DIAGNOSIS — R509 Fever, unspecified: Secondary | ICD-10-CM | POA: Insufficient documentation

## 2013-06-03 DIAGNOSIS — F3289 Other specified depressive episodes: Secondary | ICD-10-CM | POA: Insufficient documentation

## 2013-06-03 DIAGNOSIS — E669 Obesity, unspecified: Secondary | ICD-10-CM | POA: Insufficient documentation

## 2013-06-03 DIAGNOSIS — IMO0002 Reserved for concepts with insufficient information to code with codable children: Secondary | ICD-10-CM | POA: Insufficient documentation

## 2013-06-03 DIAGNOSIS — Z79899 Other long term (current) drug therapy: Secondary | ICD-10-CM | POA: Insufficient documentation

## 2013-06-03 DIAGNOSIS — F909 Attention-deficit hyperactivity disorder, unspecified type: Secondary | ICD-10-CM | POA: Insufficient documentation

## 2013-06-03 DIAGNOSIS — A749 Chlamydial infection, unspecified: Secondary | ICD-10-CM | POA: Insufficient documentation

## 2013-06-03 DIAGNOSIS — R112 Nausea with vomiting, unspecified: Secondary | ICD-10-CM | POA: Insufficient documentation

## 2013-06-03 DIAGNOSIS — J45909 Unspecified asthma, uncomplicated: Secondary | ICD-10-CM | POA: Insufficient documentation

## 2013-06-03 DIAGNOSIS — F17200 Nicotine dependence, unspecified, uncomplicated: Secondary | ICD-10-CM | POA: Insufficient documentation

## 2013-06-03 NOTE — ED Notes (Addendum)
Pt. reports generalized body aches , chills, diarrhea and emesis onset this morning with occasional dry cough . Respirations unlabored / denies fever .

## 2013-06-04 ENCOUNTER — Emergency Department (HOSPITAL_COMMUNITY)
Admission: EM | Admit: 2013-06-04 | Discharge: 2013-06-04 | Disposition: A | Discharge status: Home / Self Care | Payer: Medicaid Other | Attending: Emergency Medicine | Admitting: Emergency Medicine

## 2013-06-04 DIAGNOSIS — J111 Influenza due to unidentified influenza virus with other respiratory manifestations: Secondary | ICD-10-CM

## 2013-06-04 MED ORDER — AZITHROMYCIN 1 G PO PACK
1.0000 g | PACK | Freq: Once | ORAL | Status: AC
  Administered 2013-06-04: 1 g via ORAL
  Filled 2013-06-04: qty 1

## 2013-06-04 MED ORDER — OSELTAMIVIR PHOSPHATE 75 MG PO CAPS
75.0000 mg | ORAL_CAPSULE | Freq: Once | ORAL | Status: AC
  Administered 2013-06-04: 75 mg via ORAL
  Filled 2013-06-04: qty 1

## 2013-06-04 MED ORDER — STERILE WATER FOR INJECTION IJ SOLN
INTRAMUSCULAR | Status: AC
  Administered 2013-06-04: 10 mL
  Filled 2013-06-04: qty 10

## 2013-06-04 MED ORDER — CEFTRIAXONE SODIUM 250 MG IJ SOLR
250.0000 mg | Freq: Once | INTRAMUSCULAR | Status: AC
Start: 2013-06-04 — End: 2013-06-04
  Administered 2013-06-04: 250 mg via INTRAMUSCULAR
  Filled 2013-06-04: qty 250

## 2013-06-04 MED ORDER — OSELTAMIVIR PHOSPHATE 75 MG PO CAPS: 75.0000 mg | ORAL_CAPSULE | Freq: Two times a day (BID) | ORAL | Status: DC

## 2013-06-04 MED ORDER — ONDANSETRON 4 MG PO TBDP
4.0000 mg | ORAL_TABLET | Freq: Once | ORAL | Status: AC
  Administered 2013-06-04: 4 mg via ORAL
  Filled 2013-06-04: qty 1

## 2013-06-04 MED ORDER — ONDANSETRON HCL 4 MG PO TABS: 4.0000 mg | ORAL_TABLET | Freq: Four times a day (QID) | ORAL | Status: DC

## 2013-06-04 NOTE — ED Provider Notes (Signed)
CSN: [id number]     Arrival date & time 06/03/13  2145 History   First MD Initiated Contact with Patient 06/04/13 0016     Chief Complaint  Patient presents with  . Generalized Body Aches   (Consider location/radiation/quality/duration/timing/severity/associated sxs/prior Treatment) HPI  Patient is a 23 yo woman who presents with generalized body aches, myalgias, arthalgias and headache along with nausea and vomiting. Pt says her whole body hurts from head to toe. Pain is aching, cramping and 7/10. Patient reports 4 episodes of NBNB emesis.  She has had 4 episodes of NB diarrhea. No abdominal pain. No GU sx. Her sx began yesterday. She reports a Tm of 101F. Her sx began approx 17 hrs ago. Nothing makes her sx worse or better. Her pain is nonradiating.   Patient says she was diagnosed with chlamydia about 4 weeks ago, prescribed an antibiotic but, only took it for a few days because she felt nauseated. So, she is asking to have "the shot" today.    Past Medical History  Diagnosis Date  . Asthma     childhood /grew out of it  . ADHD (attention deficit hyperactivity disorder)   . Urinary tract infection   . Trichomonas   . Fractured fibula     left  . Degenerative disk disease   . Abnormal Pap smear   . Depression     denies   Past Surgical History  Procedure Laterality Date  . Wisdom tooth extraction    . Induced abortion    . Dilation and curettage of uterus     Family History  Problem Relation Age of Onset  . Diabetes Mother   . Hypertension Mother   . Heart disease Mother   . Diabetes Maternal Grandfather   . Anesthesia problems Neg Hx   . Hypotension Neg Hx   . Malignant hyperthermia Neg Hx   . Pseudochol deficiency Neg Hx   . Heart disease Maternal Grandmother     chf   History  Substance Use Topics  . Smoking status: Current Every Day Smoker -- 0.25 packs/day for 6 years    Types: Cigarettes  . Smokeless tobacco: Never Used  . Alcohol Use: 0.0 oz/week   Comment: denies use currently but was a heavy user   OB History   Grav Para Term Preterm Abortions TAB SAB Ect Mult Living   3 1 1  0 2 1 1  0 0 1     Review of Systems Ten point review of symptoms performed and is negative with the exception of symptoms noted above.   Allergies  Other; Tramadol; and Tomato  Home Medications   Current Outpatient Rx  Name  Route  Sig  Dispense  Refill  . Multiple Vitamin (MULTIVITAMIN WITH MINERALS) TABS tablet   Oral   Take 1 tablet by mouth daily.         . norethindrone-ethinyl estradiol-iron (ESTROSTEP FE,TILIA FE,TRI-LEGEST FE) 1-20/1-30/1-35 MG-MCG tablet   Oral   Take 1 tablet by mouth daily.          BP 107/67  Pulse 90  Temp(Src) 98 F (36.7 C) (Oral)  Resp 20  SpO2 97%  LMP 05/26/2013 Physical Exam Gen: well developed and well nourished appearing Head: NCAT Eyes: PERL, EOMI Nose: no epistaixis or rhinorrhea Mouth/throat: mucosa is moist and pink Neck: supple, no stridor Lungs: CTA B, no wheezing, rhonchi or rales CV: RRR, no murmur, extremities appear well perfused.  Abd: soft, obese, notender, nondistended Back: no ttp, no  cva ttp Skin: warm and dry Ext: normal to inspection, no dependent edema Neuro: CN ii-xii grossly intact, no focal deficits Psyche; normal affect,  calm and cooperative.   ED Course  Procedures (including critical care time) Labs Review   MDM  Patient with clinical diagnosis of influenza. We will intiate tx with Tamiflu. First dose given in ED. EHR review shows that, indeed, the patient was PCR positive for chlamydia 3 weeks ago. We will tx Azithromycin po x 1. Patient counseled re: supportive care for flu and return precautions. Plan to discharge.     Brandt LoosenJulie Manly, MD 06/04/13 [phone]

## 2013-06-16 ENCOUNTER — Encounter (HOSPITAL_COMMUNITY): Payer: Self-pay | Admitting: *Deleted

## 2013-06-16 ENCOUNTER — Inpatient Hospital Stay (HOSPITAL_COMMUNITY)
Admission: AD | Admit: 2013-06-16 | Discharge: 2013-06-16 | Disposition: A | Discharge status: Home / Self Care | Payer: Medicaid Other | Source: Ambulatory Visit | Attending: Obstetrics & Gynecology | Admitting: Obstetrics & Gynecology

## 2013-06-16 DIAGNOSIS — B373 Candidiasis of vulva and vagina: Secondary | ICD-10-CM

## 2013-06-16 DIAGNOSIS — F17200 Nicotine dependence, unspecified, uncomplicated: Secondary | ICD-10-CM | POA: Insufficient documentation

## 2013-06-16 DIAGNOSIS — M549 Dorsalgia, unspecified: Secondary | ICD-10-CM | POA: Insufficient documentation

## 2013-06-16 DIAGNOSIS — R109 Unspecified abdominal pain: Secondary | ICD-10-CM | POA: Insufficient documentation

## 2013-06-16 DIAGNOSIS — F909 Attention-deficit hyperactivity disorder, unspecified type: Secondary | ICD-10-CM | POA: Insufficient documentation

## 2013-06-16 DIAGNOSIS — N939 Abnormal uterine and vaginal bleeding, unspecified: Secondary | ICD-10-CM

## 2013-06-16 DIAGNOSIS — J45909 Unspecified asthma, uncomplicated: Secondary | ICD-10-CM | POA: Insufficient documentation

## 2013-06-16 DIAGNOSIS — N898 Other specified noninflammatory disorders of vagina: Secondary | ICD-10-CM | POA: Insufficient documentation

## 2013-06-16 DIAGNOSIS — B3731 Acute candidiasis of vulva and vagina: Secondary | ICD-10-CM

## 2013-06-16 HISTORY — DX: Chlamydial infection, unspecified: A74.9

## 2013-06-16 LAB — URINALYSIS, ROUTINE W REFLEX MICROSCOPIC
Bilirubin Urine: NEGATIVE
GLUCOSE, UA: NEGATIVE mg/dL
Ketones, ur: NEGATIVE mg/dL
Nitrite: NEGATIVE
Protein, ur: 30 mg/dL — AB
SPECIFIC GRAVITY, URINE: 1.025 (ref 1.005–1.030)
Urobilinogen, UA: 0.2 mg/dL (ref 0.0–1.0)
pH: 6.5 (ref 5.0–8.0)

## 2013-06-16 LAB — URINE MICROSCOPIC-ADD ON

## 2013-06-16 LAB — WET PREP, GENITAL
Clue Cells Wet Prep HPF POC: NONE SEEN
Trich, Wet Prep: NONE SEEN

## 2013-06-16 LAB — POCT PREGNANCY, URINE: Preg Test, Ur: NEGATIVE

## 2013-06-16 MED ORDER — FLUCONAZOLE 150 MG PO TABS: 150.0000 mg | ORAL_TABLET | Freq: Once | ORAL | Status: DC

## 2013-06-16 NOTE — Discharge Instructions (Signed)
Abnormal Uterine Bleeding Abnormal uterine bleeding can affect women at various stages in life, including teenagers, women in their reproductive years, pregnant women, and women who have reached menopause. Several kinds of uterine bleeding are considered abnormal, including:  Bleeding or spotting between periods.   Bleeding after sexual intercourse.   Bleeding that is heavier or more than normal.   Periods that last longer than usual.  Bleeding after menopause.  Many cases of abnormal uterine bleeding are minor and simple to treat, while others are more serious. Any type of abnormal bleeding should be evaluated by your health care provider. Treatment will depend on the cause of the bleeding. HOME CARE INSTRUCTIONS Monitor your condition for any changes. The following actions may help to alleviate any discomfort you are experiencing:  Avoid the use of tampons and douches as directed by your health care provider.  Change your pads frequently. You should get regular pelvic exams and Pap tests. Keep all follow-up appointments for diagnostic tests as directed by your health care provider.  SEEK MEDICAL CARE IF:   Your bleeding lasts more than 1 week.   You feel dizzy at times.  SEEK IMMEDIATE MEDICAL CARE IF:   You pass out.   You are changing pads every 15 to 30 minutes.   You have abdominal pain.  You have a fever.   You become sweaty or weak.   You are passing large blood clots from the vagina.   You start to feel nauseous and vomit. MAKE SURE YOU:   Understand these instructions.  Will watch your condition.  Will get help right away if you are not doing well or get worse. Document Released: 04/15/2005 Document Revised: 12/16/2012 Document Reviewed: 11/12/2012 [facility] Patient Information 2014 [place], [place]. Oral Contraception Information Oral contraceptive pills (OCPs) are medicines taken to prevent pregnancy. OCPs work by preventing the ovaries from  releasing eggs. The hormones in OCPs also cause the cervical mucus to thicken, preventing the sperm from entering the uterus. The hormones also cause the uterine lining to become thin, not allowing a fertilized egg to attach to the inside of the uterus. OCPs are highly effective when taken exactly as prescribed. However, OCPs do not prevent sexually transmitted diseases (STDs). Safe sex practices, such as using condoms along with the pill, can help prevent STDs.  Before taking the pill, you may have a physical exam and Pap test. Your health care provider may order blood tests. The health care provider will make sure you are a good candidate for oral contraception. Discuss with your health care provider the possible side effects of the OCP you may be prescribed. When starting an OCP, it can take 2 to 3 months for the body to adjust to the changes in hormone levels in your body.  TYPES OF ORAL CONTRACEPTION  The combination pill This pill contains estrogen and progestin (synthetic progesterone) hormones. The combination pill comes in 21-day, 28-day, or 91-day packs. Some types of combination pills are meant to be taken continuously (365-day pills). With 21-day packs, you do not take pills for 7 days after the last pill. With 28-day packs, the pill is taken every day. The last 7 pills are without hormones. Certain types of pills have more than 21 hormone-containing pills. With 91-day packs, the first 84 pills contain both hormones, and the last 7 pills contain no hormones or contain estrogen only.  The minipill This pill contains the progesterone hormone only. The pill is taken every day continuously. It is very  important to take the pill at the same time each day. The minipill comes in packs of 28 pills. All 28 pills contain the hormone.  ADVANTAGES OF ORAL CONTRACEPTIVE PILLS  Decreases premenstrual symptoms.   Treats menstrual period cramps.   Regulates the menstrual cycle.   Decreases a heavy  menstrual flow.   May treatacne, depending on the type of pill.   Treats abnormal uterine bleeding.   Treats polycystic ovarian syndrome.   Treats endometriosis.   Can be used as emergency contraception.  THINGS THAT CAN MAKE ORAL CONTRACEPTIVE PILLS LESS EFFECTIVE OCPs can be less effective if:   You forget to take the pill at the same time every day.   You have a stomach or intestinal disease that lessens the absorption of the pill.   You take OCPs with other medicines that make OCPs less effective, such as antibiotics, certain HIV medicines, and some seizure medicines.   You take expired OCPs.   You forget to restart the pill on day 7, when using the packs of 21 pills.  RISKS ASSOCIATED WITH ORAL CONTRACEPTIVE PILLS  Oral contraceptive pills can sometimes cause side effects, such as:  Headache.  Nausea.  Breast tenderness.  Irregular bleeding or spotting. Combination pills are also associated with a small increased risk of:  Blood clots.  Heart attack.  Stroke. Document Released: 07/06/2002 Document Revised: 02/03/2013 Document Reviewed: 10/04/2012 [facility] Patient Information 2014 [place], [place].

## 2013-06-16 NOTE — MAU Provider Note (Signed)
 @MAUPATCONTACT @  Chief Complaint:  Abdominal Pain and Back Pain   Teresa [name] is  23 y.o. [id number] at Unknown presents complaining of Abdominal Pain and Back Pain .   Pt developed gradual onset lower abdominal pain and back pain 3 Days ago. Pain is constant and cramping In nature, severity 4/10 . Pt states it feels like menstrual cramps.  Also complains of vaginal spotting that started 2 days ago, few spots on panty liner that doesn't soak through. Pt thinks she might be pregnant but she has not taken a HPT.  Pt is using OCP's for birth control. But missed a week of pills last week as as she was out of town and forgot them. Patient's last menstrual period was 05/26/2013.  Hx of recent chlamydia infection in January, she did not complete oral antibiotic course as it made her feel sick. Received RocephinIM. Partner was treated.  Denies any nausea, vomiting, other G.I or urinary symptoms. Admits to Vaginal Irritation and nipple tenderness. Denies vaginal discharge   Obstetrical/Gynecological History: OB History   Grav Para Term Preterm Abortions TAB SAB Ect Mult Living   3 1 1  0 2 1 1  0 0 1     Past Medical History: Past Medical History  Diagnosis Date  . Asthma     childhood /grew out of it  . ADHD (attention deficit hyperactivity disorder)   . Urinary tract infection   . Trichomonas   . Fractured fibula     left  . Degenerative disk disease   . Abnormal Pap smear   . Depression     denies  . Chlamydia     Past Surgical History: Past Surgical History  Procedure Laterality Date  . Wisdom tooth extraction    . Induced abortion    . Dilation and curettage of uterus      Family History: Family History  Problem Relation Age of Onset  . Diabetes Mother   . Hypertension Mother   . Heart disease Mother   . Diabetes Maternal Grandfather   . Anesthesia problems Neg Hx   . Hypotension Neg Hx   . Malignant hyperthermia Neg Hx   . Pseudochol deficiency Neg Hx   .  Heart disease Maternal Grandmother     chf    Social History: History  Substance Use Topics  . Smoking status: Current Every Day Smoker -- 0.25 packs/day for 6 years    Types: Cigarettes  . Smokeless tobacco: Never Used  . Alcohol Use: 0.0 oz/week     Comment: denies use currently but was a heavy user    Allergies:  Allergies  Allergen Reactions  . Other Anaphylaxis    Grapes, strawberries, bananas, pineapple, oranges, nuts  . Tramadol Nausea And Vomiting  . Tomato Itching and Rash    Meds:  Prescriptions prior to admission  Medication Sig Dispense Refill  . Multiple Vitamin (MULTIVITAMIN WITH MINERALS) TABS tablet Take 1 tablet by mouth daily.      . norethindrone-ethinyl estradiol-iron (ESTROSTEP FE,TILIA FE,TRI-LEGEST FE) 1-20/1-30/1-35 MG-MCG tablet Take 1 tablet by mouth daily.      . ondansetron (ZOFRAN) 4 MG tablet Take 1 tablet (4 mg total) by mouth every 6 (six) hours.  12 tablet  0  . oseltamivir (TAMIFLU) 75 MG capsule Take 1 capsule (75 mg total) by mouth every 12 (twelve) hours.  10 capsule  0    Review of Systems -   Review of Systems  Constitutional: Negative for fever HENT: Negative for hearing  loss, ear pain, nosebleeds, congestion, sore throat, neck pain, tinnitus and ear discharge.   Eyes: Negative for blurred vision, double vision, photophobia, pain, discharge and redness.  Respiratory: Negative for cough, hemoptysis, sputum production, shortness of breath, wheezing and stridor.   Cardiovascular: Negative for chest pain, palpitations, orthopnea,  leg swelling  Gastrointestinal: Positive for abdominal pain. Negative for heartburn, nausea, vomiting, diarrhea, constipation, blood in stool Genitourinary: Negative for dysuria, urgency, frequency, hematuria and flank pain.  Musculoskeletal: Negative for myalgias, back pain, joint pain and falls.  Skin: Negative for itching and rash.    Physical Exam  Blood pressure 115/68, pulse 84, temperature 98.8 F  (37.1 C), temperature source Oral, resp. rate 20, height 5\' 2"  (1.575 m), weight 129.547 kg (285 lb 9.6 oz), last menstrual period 05/26/2013. GENERAL: Well-developed, well-nourished female in no acute distress.  LUNGS: Clear to auscultation bilaterally.  HEART: Regular rate and rhythm. ABDOMEN: Soft, nontender, nondistended, gravid.  SSE: Normal EFG, scant Dark red blood from the cervical os. No abnormal lesions or discharge Bimanual: No CMT, Uterus NSSP, no adnexal tenderness   Labs: Results for orders placed during the hospital encounter of 06/16/13 (from the past 24 hour(s))  URINALYSIS, ROUTINE W REFLEX MICROSCOPIC   Collection Time    06/16/13  2:55 PM      Result Value Ref Range   Color, Urine YELLOW  YELLOW   APPearance HAZY (*) CLEAR   Specific Gravity, Urine 1.025  1.005 - 1.030   pH 6.5  5.0 - 8.0   Glucose, UA NEGATIVE  NEGATIVE mg/dL   Hgb urine dipstick LARGE (*) NEGATIVE   Bilirubin Urine NEGATIVE  NEGATIVE   Ketones, ur NEGATIVE  NEGATIVE mg/dL   Protein, ur 30 (*) NEGATIVE mg/dL   Urobilinogen, UA 0.2  0.0 - 1.0 mg/dL   Nitrite NEGATIVE  NEGATIVE   Leukocytes, UA SMALL (*) NEGATIVE  URINE MICROSCOPIC-ADD ON   Collection Time    06/16/13  2:55 PM      Result Value Ref Range   Squamous Epithelial / LPF MANY (*) RARE   WBC, UA 7-10  <3 WBC/hpf   RBC / HPF 3-6  <3 RBC/hpf   Urine-Other MUCOUS PRESENT    POCT PREGNANCY, URINE   Collection Time    06/16/13  3:27 PM      Result Value Ref Range   Preg Test, Ur NEGATIVE  NEGATIVE  WET PREP, GENITAL   Collection Time    06/16/13  4:07 PM      Result Value Ref Range   Yeast Wet Prep HPF POC FEW (*) NONE SEEN   Trich, Wet Prep NONE SEEN  NONE SEEN   Clue Cells Wet Prep HPF POC NONE SEEN  NONE SEEN   WBC, Wet Prep HPF POC MANY (*) NONE SEEN   Imaging Studies:  No results found.  Assessment: Carlia C [name] is  23 y.o. [id number] presents with abdominal cramping, back pain and vaginal spotting. Pt is having  withdrawal bleeding due to the missed OCP's   Plan: Counseled pt to discard current OCP's pack and start new one, and use back up method of birth control like condoms. Pt was also counselled regarding other more effective methods of Contraception. # GC/CT probe for TOC of Chlamydia    Sallyanne Havers 2/18/20153:31 PM Evaluation and management procedures were performed by MS under my supervision/collaboration. Chart reviewed, patient examined by me and I agree with management and plan. Reassured not pregnant Advised LARC> has appointment WOC  and considering IUD. Abstain from intercourse 2 wks prior to appointment Follow-up Information   Follow up with [facility] On 07/19/2013.   Specialty:  Obstetrics and Gynecology   Contact information:   [address] [place] [address] [phone]      Follow up with [facility] On 07/19/2013.   Specialty:  Obstetrics and Gynecology   Contact information:   [address] [place] [address] [phone]     CT culture neg Danae Orleans, CNM 06/17/2013 10:13 AM

## 2013-06-16 NOTE — MAU Note (Signed)
Dx'd with chlamydia in December, received shot.  Has been irritated & raw vaginally ever since.  Lower abd & lower back pain x 3 days.  On BCP's, but pt thinks she might be pregnant, has not taken a test.

## 2013-06-17 LAB — GC/CHLAMYDIA PROBE AMP
CT Probe RNA: NEGATIVE
GC Probe RNA: NEGATIVE

## 2013-07-19 ENCOUNTER — Ambulatory Visit (INDEPENDENT_AMBULATORY_CARE_PROVIDER_SITE_OTHER): Payer: Medicaid Other | Admitting: Obstetrics & Gynecology

## 2013-07-19 ENCOUNTER — Other Ambulatory Visit (HOSPITAL_COMMUNITY)
Admission: RE | Admit: 2013-07-19 | Discharge: 2013-07-19 | Disposition: A | Discharge status: Home / Self Care | Payer: Medicaid Other | Source: Ambulatory Visit | Attending: Obstetrics and Gynecology | Admitting: Obstetrics and Gynecology

## 2013-07-19 ENCOUNTER — Encounter: Payer: Self-pay | Admitting: Obstetrics & Gynecology

## 2013-07-19 VITALS — BP 125/75 | HR 81 | Ht 62.0 in | Wt 283.2 lb

## 2013-07-19 DIAGNOSIS — N760 Acute vaginitis: Secondary | ICD-10-CM | POA: Insufficient documentation

## 2013-07-19 DIAGNOSIS — Z01419 Encounter for gynecological examination (general) (routine) without abnormal findings: Secondary | ICD-10-CM | POA: Diagnosis not present

## 2013-07-19 DIAGNOSIS — Z124 Encounter for screening for malignant neoplasm of cervix: Secondary | ICD-10-CM | POA: Insufficient documentation

## 2013-07-19 LAB — POCT PREGNANCY, URINE: Preg Test, Ur: NEGATIVE

## 2013-07-19 MED ORDER — PRENATAL VITAMINS 0.8 MG PO TABS: 1.0000 | ORAL_TABLET | Freq: Every day | ORAL | Status: DC

## 2013-07-19 NOTE — Progress Notes (Signed)
Patient ID: Teresa BasemanMyracle C [name], female   DOB: 1990/05/10, 23 y.o.   MRN: [id number] Subjective:     Teresa [name] is a 23 y.o. female here for a routine exam.  Current complaints: none. Pt trying to get pregnant. Stopped OCP's 1 month prev.  She reports an STI 2 months prev.  She c/o 'that something is wrong with her vagina.'    Gynecologic History Patient's last menstrual period was 07/17/2013. Contraception: OCP (estrogen/progesterone)- stopped 1 month ago Last Pap: 04/2012. Results were: abnormal: LGSIL h/o Chlamydia (04/2013)- treated   Obstetric History OB History  Gravida Para Term Preterm AB SAB TAB Ectopic Multiple Living  3 1 1  0 2 1 1  0 0 1    # Outcome Date GA Lbr Len/2nd Weight Sex Delivery Anes PTL Lv  3 TRM 11/27/11 [id number] 702:35 / 01:55 8 lb 5.7 oz (3.79 kg) F SVD EPI  Y  2 TAB           1 SAB                The following portions of the patient's history were reviewed and updated as appropriate: allergies, current medications, past family history, past medical history, past social history, past surgical history and problem list.  Review of Systems Pertinent items are noted in HPI.    Objective:    BP 125/75  Pulse 81  Ht 5\' 2"  (1.575 m)  Wt 283 lb 3.2 oz (128.459 kg)  BMI 51.78 kg/m2  LMP 07/17/2013  Breastfeeding? No  General Appearance:    Alert, cooperative, no distress, appears stated age                 Neck:   Supple, symmetrical, trachea midline, no adenopathy;    thyroid:  no enlargement/tenderness/nodules; no carotid   bruit or JVD  Back:     Symmetric, no curvature, ROM normal, no CVA tenderness  Lungs:     Clear to auscultation bilaterally, respirations unlabored  Chest Wall:    No tenderness or deformity   Heart:    Regular rate and rhythm, S1 and S2 normal, no murmur, rub   or gallop  Breast Exam:    No tenderness, masses, or nipple abnormality  Abdomen:     Soft, non-tender, bowel sounds active all four quadrants,    no masses, no  organomegaly  Genitalia:    Normal female without lesion, discharge or tenderness     Extremities:   Extremities normal, atraumatic, no cyanosis or edema  Pulses:   2+ and symmetric all extremities  Skin:   Skin color, texture, turgor normal, no rashes or lesions            Assessment:    Healthy female exam.    Plan:    Follow up in: 1 year.   F/u PAP and cx PNV's

## 2013-07-19 NOTE — Patient Instructions (Signed)
Vaginitis Vaginitis is an inflammation of the vagina. It is most often caused by a change in the normal balance of the bacteria and yeast that live in the vagina. This change in balance causes an overgrowth of certain bacteria or yeast, which causes the inflammation. There are different types of vaginitis, but the most common types are:  Bacterial vaginosis.  Yeast infection (candidiasis).  Trichomoniasis vaginitis. This is a sexually transmitted infection (STI).  Viral vaginitis.  Atropic vaginitis.  Allergic vaginitis. CAUSES  The cause depends on the type of vaginitis. Vaginitis can be caused by:  Bacteria (bacterial vaginosis).  Yeast (yeast infection).  A parasite (trichomoniasis vaginitis)  A virus (viral vaginitis).  Low hormone levels (atrophic vaginitis). Low hormone levels can occur during pregnancy, breastfeeding, or after menopause.  Irritants, such as bubble baths, scented tampons, and feminine sprays (allergic vaginitis). Other factors can change the normal balance of the yeast and bacteria that live in the vagina. These include:  Antibiotic medicines.  Poor hygiene.  Diaphragms, vaginal sponges, spermicides, birth control pills, and intrauterine devices (IUD).  Sexual intercourse.  Infection.  Uncontrolled diabetes.  A weakened immune system. SYMPTOMS  Symptoms can vary depending on the cause of the vaginitis. Common symptoms include:  Abnormal vaginal discharge.  The discharge is white, gray, or yellow with bacterial vaginosis.  The discharge is thick, white, and cheesy with a yeast infection.  The discharge is frothy and yellow or greenish with trichomoniasis.  A bad vaginal odor.  The odor is fishy with bacterial vaginosis.  Vaginal itching, pain, or swelling.  Painful intercourse.  Pain or burning when urinating. Sometimes, there are no symptoms. TREATMENT  Treatment will vary depending on the type of infection.   Bacterial  vaginosis and trichomoniasis are often treated with antibiotic creams or pills.  Yeast infections are often treated with antifungal medicines, such as vaginal creams or suppositories.  Viral vaginitis has no cure, but symptoms can be treated with medicines that relieve discomfort. Your sexual partner should be treated as well.  Atrophic vaginitis may be treated with an estrogen cream, pill, suppository, or vaginal ring. If vaginal dryness occurs, lubricants and moisturizing creams may help. You may be told to avoid scented soaps, sprays, or douches.  Allergic vaginitis treatment involves quitting the use of the product that is causing the problem. Vaginal creams can be used to treat the symptoms. HOME CARE INSTRUCTIONS   Take all medicines as directed by your caregiver.  Keep your genital area clean and dry. Avoid soap and only rinse the area with water.  Avoid douching. It can remove the healthy bacteria in the vagina.  Do not use tampons or have sexual intercourse until your vaginitis has been treated. Use sanitary pads while you have vaginitis.  Wipe from front to back. This avoids the spread of bacteria from the rectum to the vagina.  Let air reach your genital area.  Wear cotton underwear to decrease moisture buildup.  Avoid wearing underwear while you sleep until your vaginitis is gone.  Avoid tight pants and underwear or nylons without a cotton panel.  Take off wet clothing (especially bathing suits) as soon as possible.  Use mild, non-scented products. Avoid using irritants, such as:  Scented feminine sprays.  Fabric softeners.  Scented detergents.  Scented tampons.  Scented soaps or bubble baths.  Practice safe sex and use condoms. Condoms may prevent the spread of trichomoniasis and viral vaginitis. SEEK MEDICAL CARE IF:   You have abdominal pain.  You   have a fever or persistent symptoms for more than 2 3 days.  You have a fever and your symptoms suddenly  get worse. Document Released: 02/10/2007 Document Revised: 01/08/2012 Document Reviewed: 09/26/2011 Exit[employer] Patient Information 2014 [employer].  

## 2013-07-21 LAB — CERVICOVAGINAL ANCILLARY ONLY: BACTERIAL VAGINITIS: NEGATIVE

## 2013-08-08 ENCOUNTER — Encounter (HOSPITAL_COMMUNITY): Payer: Self-pay | Admitting: Emergency Medicine

## 2013-08-08 ENCOUNTER — Emergency Department (HOSPITAL_COMMUNITY)
Admission: EM | Admit: 2013-08-08 | Discharge: 2013-08-08 | Disposition: A | Discharge status: Home / Self Care | Payer: Medicaid Other | Attending: Emergency Medicine | Admitting: Emergency Medicine

## 2013-08-08 DIAGNOSIS — Z3202 Encounter for pregnancy test, result negative: Secondary | ICD-10-CM | POA: Insufficient documentation

## 2013-08-08 DIAGNOSIS — M25473 Effusion, unspecified ankle: Secondary | ICD-10-CM | POA: Insufficient documentation

## 2013-08-08 DIAGNOSIS — F17200 Nicotine dependence, unspecified, uncomplicated: Secondary | ICD-10-CM | POA: Insufficient documentation

## 2013-08-08 DIAGNOSIS — Z8781 Personal history of (healed) traumatic fracture: Secondary | ICD-10-CM | POA: Insufficient documentation

## 2013-08-08 DIAGNOSIS — Z8619 Personal history of other infectious and parasitic diseases: Secondary | ICD-10-CM | POA: Insufficient documentation

## 2013-08-08 DIAGNOSIS — J45909 Unspecified asthma, uncomplicated: Secondary | ICD-10-CM | POA: Insufficient documentation

## 2013-08-08 DIAGNOSIS — M25579 Pain in unspecified ankle and joints of unspecified foot: Secondary | ICD-10-CM | POA: Insufficient documentation

## 2013-08-08 DIAGNOSIS — R209 Unspecified disturbances of skin sensation: Secondary | ICD-10-CM | POA: Insufficient documentation

## 2013-08-08 DIAGNOSIS — Z8659 Personal history of other mental and behavioral disorders: Secondary | ICD-10-CM | POA: Insufficient documentation

## 2013-08-08 DIAGNOSIS — Z87440 Personal history of urinary (tract) infections: Secondary | ICD-10-CM | POA: Insufficient documentation

## 2013-08-08 DIAGNOSIS — M25476 Effusion, unspecified foot: Secondary | ICD-10-CM | POA: Insufficient documentation

## 2013-08-08 DIAGNOSIS — M79609 Pain in unspecified limb: Secondary | ICD-10-CM

## 2013-08-08 DIAGNOSIS — M25572 Pain in left ankle and joints of left foot: Secondary | ICD-10-CM

## 2013-08-08 LAB — POC URINE PREG, ED: Preg Test, Ur: NEGATIVE

## 2013-08-08 MED ORDER — MELOXICAM 7.5 MG PO TABS: 15.0000 mg | ORAL_TABLET | Freq: Every day | ORAL | Status: DC

## 2013-08-08 NOTE — Progress Notes (Signed)
VASCULAR LAB PRELIMINARY  PRELIMINARY  PRELIMINARY  PRELIMINARY  Left lower extremity venous Doppler completed.    Preliminary report:  There is no obvious evidence of DVT or SVT noted in the left lower extremity.  Kern AlbertaCandace R Jansen Goodpasture, RVT 08/08/2013, 12:42 PM

## 2013-08-08 NOTE — ED Notes (Signed)
Pt broke left fibula 1 year ago, states she always has pain but this week left ankle pain increased, walking with limp.  + pedal pulse.

## 2013-08-08 NOTE — ED Provider Notes (Signed)
CSN: [id number]     Arrival date & time 08/08/13  1154 History  This chart was scribed for non-physician practitioner, Junius Finner, PA-C working with Ward Givens, MD by Greggory Stallion, ED scribe. This patient was seen in room TR07C/TR07C and the patient's care was started at 12:03 PM.   Chief Complaint  Patient presents with  . Ankle Pain   The history is provided by the patient. No language interpreter was used.   HPI Comments: Teresa [name] is a 23 y.o. female who presents to the Emergency Department complaining of gradual onset, constant, aching, throbbing left ankle pain with associated swelling that started 2 days ago. The pain radiates up her leg into her calf. Denies injury. Ambulating for long periods of time causes tingling in her toes. She has taken ibuprofen and done warm soaks with no relief. Pt states she broke her left fibula in February 2014 but did not have surgery. Denies recent long distance travel or history of blood blots. She is on birth control pills but has not taken them in 2 weeks. Pt is requesting a pregnancy test.   Past Medical History  Diagnosis Date  . Asthma     childhood /grew out of it  . ADHD (attention deficit hyperactivity disorder)   . Urinary tract infection   . Trichomonas   . Fractured fibula     left  . Degenerative disk disease   . Abnormal Pap smear   . Depression     denies  . Chlamydia    Past Surgical History  Procedure Laterality Date  . Wisdom tooth extraction    . Induced abortion    . Dilation and curettage of uterus     Family History  Problem Relation Age of Onset  . Diabetes Mother   . Hypertension Mother   . Heart disease Mother   . Diabetes Maternal Grandfather   . Anesthesia problems Neg Hx   . Hypotension Neg Hx   . Malignant hyperthermia Neg Hx   . Pseudochol deficiency Neg Hx   . Heart disease Maternal Grandmother     chf   History  Substance Use Topics  . Smoking status: Current Every Day Smoker -- 0.25  packs/day for 6 years    Types: Cigarettes  . Smokeless tobacco: Never Used  . Alcohol Use: 0.0 oz/week     Comment: denies use currently but was a heavy user   OB History   Grav Para Term Preterm Abortions TAB SAB Ect Mult Living   3 1 1  0 2 1 1  0 0 1     Review of Systems  Musculoskeletal: Positive for arthralgias and joint swelling.  All other systems reviewed and are negative.  Allergies  Other; Tramadol; and Tomato  Home Medications   Current Outpatient Rx  Name  Route  Sig  Dispense  Refill  . ibuprofen (ADVIL,MOTRIN) 200 MG tablet   Oral   Take 400 mg by mouth every 6 (six) hours as needed for fever, headache or mild pain.         . meloxicam (MOBIC) 7.5 MG tablet   Oral   Take 2 tablets (15 mg total) by mouth daily.   30 tablet   0    BP 129/53  Pulse 58  Temp(Src) 98 F (36.7 C) (Oral)  Resp 20  SpO2 10%  LMP 03/29/2013  Breastfeeding? No  Physical Exam  Nursing note and vitals reviewed. Constitutional: She is oriented to [place], place, and  time. She appears well-developed and well-nourished.  Morbidly obese female.  HENT:  Head: Normocephalic and atraumatic.  Eyes: EOM are normal.  Neck: Normal range of motion.  Cardiovascular: Normal rate.   Pulses:      Dorsalis pedis pulses are 2+ on the left side.       Posterior tibial pulses are 2+ on the left side.  Pulmonary/Chest: Effort normal.  Musculoskeletal: Normal range of motion. She exhibits edema ( mild) and tenderness.  Left ankle: mild edema with tenderness to lateral aspect.  No deformity. FROM.   Neurological: She is alert and oriented to [place], place, and time.  Sensation to light and sharp touch in tact.  Skin: Skin is warm and dry. No erythema.  Skin in tact. No ecchymosis, erythema, or warmth. No red streaking, induration, or evidence of underlying infection.  Psychiatric: She has a normal mood and affect. Her behavior is normal.    ED Course  Procedures (including critical care  time)  DIAGNOSTIC STUDIES:  COORDINATION OF CARE: 12:07 PM-Discussed treatment plan which includes speaking with Dr. Lynelle DoctorKnapp and UA with pt at bedside and pt agreed to plan.   12:12 PM Dr. Lynelle DoctorKnapp advised doing an ultrasound to check for blood clots.   Labs Review Labs Reviewed  POC URINE PREG, ED   Imaging Review No results found.   EKG Interpretation None      MDM   Final diagnoses:  Left ankle pain   Pt c/o left ankle pain w/o hx of trauma. Pt is morbidly obese. Reports hx of fracture left fibula 1 year ago.  Skin: normal, no erythema, warmth, or ecchymosis. Pt is concerned for DVT.  Discussed pt with Dr. Devoria AlbeIva Knapp.  Venous duplex recommended: negative for DVT.  Will discharge home with pain control. Rx: mobic.  Advised to f/u with PCP and orthopedic as needed for continued pain. Return precautions provided. Pt verbalized understanding and agreement with tx plan.   I personally performed the services described in this documentation, which was scribed in my presence. The recorded information has been reviewed and is accurate.  Junius Finnerrin O'Malley, PA-C 08/08/13 1656

## 2013-08-08 NOTE — ED Notes (Signed)
Patient discharged to home with family. NAD.  

## 2013-08-08 NOTE — Discharge Instructions (Signed)
Ankle Pain  Ankle pain is a common symptom. The bones, cartilage, tendons, and muscles of the ankle joint perform a lot of work each day. The ankle joint holds your body weight and allows you to move around. Ankle pain can occur on either side or back of 1 or both ankles. Ankle pain may be sharp and burning or dull and aching. There may be tenderness, stiffness, redness, or warmth around the ankle. The pain occurs more often when a Pinho walks or puts pressure on the ankle.  CAUSES   There are many reasons ankle pain can develop. It is important to work with your caregiver to identify the cause since many conditions can impact the bones, cartilage, muscles, and tendons. Causes for ankle pain include:  · Injury, including a break (fracture), sprain, or strain often due to a fall, sports, or a high-impact activity.  · Swelling (inflammation) of a tendon (tendonitis).  · Achilles tendon rupture.  · Ankle instability after repeated sprains and strains.  · Poor foot alignment.  · Pressure on a nerve (tarsal tunnel syndrome).  · Arthritis in the ankle or the lining of the ankle.  · Crystal formation in the ankle (gout or pseudogout).  DIAGNOSIS   A diagnosis is based on your medical history, your symptoms, results of your physical exam, and results of diagnostic tests. Diagnostic tests may include X-ray exams or a computerized magnetic scan (magnetic resonance imaging, MRI).  TREATMENT   Treatment will depend on the cause of your ankle pain and may include:  · Keeping pressure off the ankle and limiting activities.  · Using crutches or other walking support (a cane or brace).  · Using rest, ice, compression, and elevation.  · Participating in physical therapy or home exercises.  · Wearing shoe inserts or special shoes.  · Losing weight.  · Taking medications to reduce pain or swelling or receiving an injection.  · Undergoing surgery.  HOME CARE INSTRUCTIONS   · Only take over-the-counter or prescription medicines for  pain, discomfort, or fever as directed by your caregiver.  · Put ice on the injured area.  · Put ice in a plastic bag.  · Place a towel between your skin and the bag.  · Leave the ice on for 15-20 minutes at a time, 03-04 times a day.  · Keep your leg raised (elevated) when possible to lessen swelling.  · Avoid activities that cause ankle pain.  · Follow specific exercises as directed by your caregiver.  · Record how often you have ankle pain, the location of the pain, and what it feels like. This information may be helpful to you and your caregiver.  · Ask your caregiver about returning to work or sports and whether you should drive.  · Follow up with your caregiver for further examination, therapy, or testing as directed.  SEEK MEDICAL CARE IF:   · Pain or swelling continues or worsens beyond 1 week.  · You have an oral temperature above 102° F (38.9° C).  · You are feeling unwell or have chills.  · You are having an increasingly difficult time with walking.  · You have loss of sensation or other new symptoms.  · You have questions or concerns.  MAKE SURE YOU:   · Understand these instructions.  · Will watch your condition.  · Will get help right away if you are not doing well or get worse.  Document Released: 10/03/2009 Document Revised: 07/08/2011 Document Reviewed: 10/03/2009  Exit[employer]®   Patient Information ©2014 [employer].

## 2013-08-17 NOTE — ED Provider Notes (Signed)
Medical screening examination/treatment/procedure(s) were performed by non-physician practitioner and as supervising physician I was immediately available for consultation/collaboration.   EKG Interpretation None      Devoria AlbeIva Geni Skorupski, MD, Armando GangFACEP   Ward GivensIva L Blaine Guiffre, MD 08/17/13 [phone]

## 2013-10-27 ENCOUNTER — Inpatient Hospital Stay (HOSPITAL_COMMUNITY)
Admission: AD | Admit: 2013-10-27 | Discharge: 2013-10-27 | Discharge status: Against Medical Advice | Payer: Medicaid Other | Source: Ambulatory Visit | Attending: Obstetrics & Gynecology | Admitting: Obstetrics & Gynecology

## 2013-10-27 ENCOUNTER — Encounter (HOSPITAL_COMMUNITY): Payer: Self-pay | Admitting: *Deleted

## 2013-10-27 DIAGNOSIS — M549 Dorsalgia, unspecified: Secondary | ICD-10-CM | POA: Insufficient documentation

## 2013-10-27 DIAGNOSIS — R109 Unspecified abdominal pain: Secondary | ICD-10-CM | POA: Insufficient documentation

## 2013-10-27 DIAGNOSIS — N912 Amenorrhea, unspecified: Secondary | ICD-10-CM | POA: Insufficient documentation

## 2013-10-27 DIAGNOSIS — Z5320 Procedure and treatment not carried out because of patient's decision for unspecified reasons: Secondary | ICD-10-CM | POA: Insufficient documentation

## 2013-10-27 HISTORY — DX: Unspecified abnormal cytological findings in specimens from vagina: R87.629

## 2013-10-27 HISTORY — DX: Personal history of other diseases of the female genital tract: Z87.42

## 2013-10-27 LAB — POCT PREGNANCY, URINE: PREG TEST UR: NEGATIVE

## 2013-10-27 NOTE — MAU Note (Signed)
Medicaid ran out, couldn't get pills. Did not use any other form of bc. No period since April. Feels like period cramps, off and on for a month. Spotted 2 or 3 months ago

## 2013-10-27 NOTE — MAU Note (Signed)
Pt leaving room  When NP going to see pt.... Thought pt was going to bathroom. Checked a few minutes later- pt had left.  Tried to call phone #- was her cousin's. They did not have a # for her.

## 2014-01-10 ENCOUNTER — Inpatient Hospital Stay (HOSPITAL_COMMUNITY)
Admission: AD | Admit: 2014-01-10 | Discharge: 2014-01-10 | Disposition: A | Discharge status: Home / Self Care | Payer: Medicaid Other | Source: Ambulatory Visit | Attending: Family Medicine | Admitting: Family Medicine

## 2014-01-10 ENCOUNTER — Encounter (HOSPITAL_COMMUNITY): Payer: Self-pay | Admitting: *Deleted

## 2014-01-10 DIAGNOSIS — N644 Mastodynia: Secondary | ICD-10-CM | POA: Insufficient documentation

## 2014-01-10 DIAGNOSIS — F17200 Nicotine dependence, unspecified, uncomplicated: Secondary | ICD-10-CM | POA: Insufficient documentation

## 2014-01-10 DIAGNOSIS — N63 Unspecified lump in unspecified breast: Secondary | ICD-10-CM | POA: Diagnosis present

## 2014-01-10 LAB — POCT PREGNANCY, URINE
Preg Test, Ur: NEGATIVE
Preg Test, Ur: NEGATIVE

## 2014-01-10 NOTE — Discharge Instructions (Signed)

## 2014-01-10 NOTE — MAU Provider Note (Signed)
History     CSN: [id number]  Arrival date and time: 01/10/14 1026   First Provider Initiated Contact with Patient 01/10/14 1106      Chief Complaint  Patient presents with  . Breast Mass   HPI  [name] is a 23 y.o. female [phone] who presents with breast lump. The patient noticed a lump on her right breast last night. The lump is painful, and is described as a constant pain. This is a new complaint; she has never noticed this before.   OB History   Grav Para Term Preterm Abortions TAB SAB Ect Mult Living   0 0 0 1      Past Medical History  Diagnosis Date  . Asthma     childhood /grew out of it  . ADHD (attention deficit hyperactivity disorder)   . Urinary tract infection   . Trichomonas   . Fractured fibula     left  . Degenerative disk disease   . Abnormal Pap smear   . Chlamydia   . Depression     post partem  . History of PCOS   . Vaginal Pap smear, abnormal     normal since    Past Surgical History  Procedure Laterality Date  . Wisdom tooth extraction    . Induced abortion    . Dilation and curettage of uterus      Family History  Problem Relation Age of Onset  . Diabetes Mother   . Hypertension Mother   . Heart disease Mother   . Diabetes Maternal Grandfather   . Anesthesia problems Neg Hx   . Hypotension Neg Hx   . Malignant hyperthermia Neg Hx   . Pseudochol deficiency Neg Hx   . Heart disease Maternal Grandmother     chf    History  Substance Use Topics  . Smoking status: Current Every Day Smoker -- 0.50 packs/day for 7 years    Types: Cigarettes  . Smokeless tobacco: Never Used  . Alcohol Use: 0.0 oz/week     Comment: occ but was a heavy user    Allergies:  Allergies  Allergen Reactions  . Other Anaphylaxis    Grapes, strawberries, bananas, pineapple, oranges, nuts  . Tramadol Nausea And Vomiting  . Tomato Itching and Rash    No prescriptions prior to admission   Results for orders placed during the  hospital encounter of 01/10/14 (from the past 48 hour(s))  POCT PREGNANCY, URINE     Status: None   Collection Time    01/10/14 10:48 AM      Result Value Ref Range   Preg Test, Ur NEGATIVE  NEGATIVE   Comment:            THE SENSITIVITY OF THIS     METHODOLOGY IS >24 mIU/mL    Review of Systems  Constitutional: Negative for fever and chills.  Skin:       Painful right breast lump   Physical Exam   Blood pressure 137/80, pulse 78, temperature 99.1 F (37.3 C), temperature source Oral, resp. rate 18, last menstrual period 11/26/2013.  Physical Exam  Constitutional: She is oriented to Teresa [name], place, and time. She appears well-developed and well-nourished. No distress.  HENT:  Head: Normocephalic.  Eyes: Pupils are equal, round, and reactive to light.  Neck: Neck supple.  Respiratory: She exhibits tenderness. She exhibits no mass, no bony tenderness, no edema and no deformity. Right breast exhibits inverted nipple  and tenderness. Right breast exhibits no mass, no nipple discharge and no skin change. Left breast exhibits inverted nipple. Left breast exhibits no mass, no nipple discharge, no skin change and no tenderness. Breasts are symmetrical.    Musculoskeletal: Normal range of motion.  Neurological: She is alert and oriented to Teresa [name], place, and time.  Skin: Skin is warm. She is not diaphoretic.  Psychiatric: Her behavior is normal.    MAU Course  Procedures None  MDM Upt   Assessment and Plan   A:  1. Breast tenderness in female    P:  Discharge home in stable condition Referral made to the breast center Pt given phone number and information and encouraged to call to schedule appointment Return to MAU if symptoms worsen.  Teresa Hansen Rasch, NP 01/10/2014, 11:09 AM

## 2014-01-10 NOTE — MAU Note (Signed)
Last night had sharp pain and pressure in rt breast shooting toward nipple.  Unable to sleep on stomach because of pain.  Felt a lump at rt nipple. No bleeding or drainage from nipple.

## 2014-01-10 NOTE — MAU Note (Signed)
C/o R breast mass that she noticed last night; c/o breast pain also;

## 2014-01-11 ENCOUNTER — Other Ambulatory Visit (HOSPITAL_COMMUNITY): Payer: Self-pay | Admitting: Advanced Practice Midwife

## 2014-01-11 DIAGNOSIS — N6310 Unspecified lump in the right breast, unspecified quadrant: Secondary | ICD-10-CM | POA: Insufficient documentation

## 2014-01-11 HISTORY — DX: Unspecified lump in the right breast, unspecified quadrant: N63.10

## 2014-01-12 NOTE — MAU Provider Note (Signed)
Attestation of Attending Supervision of Advanced Practitioner (PA/CNM/NP): Evaluation and management procedures were performed by the Advanced Practitioner under my supervision and collaboration.  I have reviewed the Advanced Practitioner's note and chart, and I agree with the management and plan.  Jacob Stinson, DO Attending Physician Faculty [facility], [facility]  

## 2014-01-13 ENCOUNTER — Other Ambulatory Visit (HOSPITAL_COMMUNITY): Payer: Self-pay | Admitting: Advanced Practice Midwife

## 2014-01-13 DIAGNOSIS — N6310 Unspecified lump in the right breast, unspecified quadrant: Secondary | ICD-10-CM

## 2014-01-18 ENCOUNTER — Ambulatory Visit
Admission: RE | Admit: 2014-01-18 | Discharge: 2014-01-18 | Disposition: A | Discharge status: Home / Self Care | Payer: Medicaid Other | Source: Ambulatory Visit | Attending: Advanced Practice Midwife | Admitting: Advanced Practice Midwife

## 2014-01-18 DIAGNOSIS — N6310 Unspecified lump in the right breast, unspecified quadrant: Secondary | ICD-10-CM

## 2014-01-20 ENCOUNTER — Encounter: Payer: Self-pay | Admitting: Advanced Practice Midwife

## 2014-01-31 ENCOUNTER — Inpatient Hospital Stay (HOSPITAL_COMMUNITY)
Admission: AD | Admit: 2014-01-31 | Discharge: 2014-01-31 | Disposition: A | Discharge status: Home / Self Care | Payer: Medicaid Other | Source: Ambulatory Visit | Attending: Obstetrics & Gynecology | Admitting: Obstetrics & Gynecology

## 2014-01-31 ENCOUNTER — Encounter (HOSPITAL_COMMUNITY): Payer: Self-pay | Admitting: *Deleted

## 2014-01-31 DIAGNOSIS — N760 Acute vaginitis: Secondary | ICD-10-CM | POA: Insufficient documentation

## 2014-01-31 DIAGNOSIS — N946 Dysmenorrhea, unspecified: Secondary | ICD-10-CM | POA: Insufficient documentation

## 2014-01-31 DIAGNOSIS — F17210 Nicotine dependence, cigarettes, uncomplicated: Secondary | ICD-10-CM | POA: Diagnosis not present

## 2014-01-31 DIAGNOSIS — Z8742 Personal history of other diseases of the female genital tract: Secondary | ICD-10-CM

## 2014-01-31 DIAGNOSIS — B9689 Other specified bacterial agents as the cause of diseases classified elsewhere: Secondary | ICD-10-CM

## 2014-01-31 DIAGNOSIS — E282 Polycystic ovarian syndrome: Secondary | ICD-10-CM | POA: Diagnosis not present

## 2014-01-31 DIAGNOSIS — N390 Urinary tract infection, site not specified: Secondary | ICD-10-CM | POA: Diagnosis not present

## 2014-01-31 DIAGNOSIS — L298 Other pruritus: Secondary | ICD-10-CM | POA: Diagnosis present

## 2014-01-31 LAB — WET PREP, GENITAL
TRICH WET PREP: NONE SEEN
YEAST WET PREP: NONE SEEN

## 2014-01-31 LAB — URINE MICROSCOPIC-ADD ON

## 2014-01-31 LAB — URINALYSIS, ROUTINE W REFLEX MICROSCOPIC
BILIRUBIN URINE: NEGATIVE
Glucose, UA: NEGATIVE mg/dL
Ketones, ur: NEGATIVE mg/dL
Nitrite: NEGATIVE
Protein, ur: NEGATIVE mg/dL
Specific Gravity, Urine: 1.02 (ref 1.005–1.030)
Urobilinogen, UA: 0.2 mg/dL (ref 0.0–1.0)
pH: 7 (ref 5.0–8.0)

## 2014-01-31 MED ORDER — CIPROFLOXACIN HCL 500 MG PO TABS: 500.0000 mg | ORAL_TABLET | Freq: Two times a day (BID) | ORAL | Status: DC

## 2014-01-31 MED ORDER — IBUPROFEN 800 MG PO TABS: 800.0000 mg | ORAL_TABLET | Freq: Four times a day (QID) | ORAL | Status: DC | PRN

## 2014-01-31 MED ORDER — IBUPROFEN 600 MG PO TABS: 600.0000 mg | ORAL_TABLET | Freq: Four times a day (QID) | ORAL | Status: DC | PRN

## 2014-01-31 MED ORDER — METRONIDAZOLE 500 MG PO TABS: 500.0000 mg | ORAL_TABLET | Freq: Three times a day (TID) | ORAL | Status: DC

## 2014-01-31 NOTE — MAU Provider Note (Signed)
CC: Abdominal Pain    First Provider Initiated Contact with Patient 01/31/14 1924      HPI Teresa [name] is a 23 y.o. [id number] who presents with onset 3 days ago of vaginal itching and increased malodorous discharge. Gets frequent BV. She is also having menstrual-like abdominal cramping. Feels like she is about to start menses. LMP 11/26/13. Has PCOS and menstruates about 3-4 times/year. Last intercourse 2 weeks ago. Also has dysuria, frequency and urgency of urination.  Past Medical History  Diagnosis Date  . Asthma     childhood /grew out of it  . ADHD (attention deficit hyperactivity disorder)   . Urinary tract infection   . Trichomonas   . Fractured fibula     left  . Degenerative disk disease   . Abnormal Pap smear   . Chlamydia   . Depression     post partem  . History of PCOS   . Vaginal Pap smear, abnormal     normal since    OB History  Gravida Para Term Preterm AB SAB TAB Ectopic Multiple Living  3 1 1  0 2 1 1  0 0 1    # Outcome Date GA Lbr Len/2nd Weight Sex Delivery Anes PTL Lv  3 TRM 11/27/11 [id number] 702:35 / 01:55 3.79 kg (8 lb 5.7 oz) F SVD EPI  Y  2 TAB           1 SAB               Past Surgical History  Procedure Laterality Date  . Wisdom tooth extraction    . Induced abortion    . Dilation and curettage of uterus      History   Social History  . Marital Status: Single    Spouse Name: N/A    Number of Children: N/A  . Years of Education: N/A   Occupational History  . Not on file.   Social History Main Topics  . Smoking status: Current Every Day Smoker -- 0.50 packs/day for 7 years    Types: Cigarettes  . Smokeless tobacco: Never Used  . Alcohol Use: 0.0 oz/week     Comment: occ but was a heavy user  . Drug Use: Yes    Special: Marijuana     Comment: Last use today  . Sexual Activity: Yes    Birth Control/ Protection: None     Comment: last intercourse 3 weeks ago   Other Topics Concern  . Not on file   Social History Narrative   . No narrative on file    No current facility-administered medications on file prior to encounter.   No current outpatient prescriptions on file prior to encounter.    Allergies  Allergen Reactions  . Other Anaphylaxis    Grapes, strawberries, bananas, pineapple, oranges, nuts  . Tramadol Nausea And Vomiting  . Tomato Itching and Rash    ROS Pertinent items in HPI  PHYSICAL EXAM Filed Vitals:   01/31/14 1836  BP: 126/77  Pulse: 94  Temp: 97.9 F (36.6 C)  Resp: 16   General: Obese AA female in no acute distress Cardiovascular: Normal rate Respiratory: Normal effort Abdomen: Soft, nontender Back: No CVAT Extremities: No edema Neurologic: Alert and oriented Speculum exam: NEFG; vagina with somewhat adherent white-gray discharge, scant brown blood from cx with q-tip insertion; cervix clean Bimanual exam: cervix closed, no CMT; unable to outline uterus or adnexae due to body habitus; NT pelvic exam   LAB RESULTS Results  for orders placed during the hospital encounter of 01/31/14 (from the past 24 hour(s))  URINALYSIS, ROUTINE W REFLEX MICROSCOPIC     Status: Abnormal   Collection Time    01/31/14  6:42 PM      Result Value Ref Range   Color, Urine YELLOW  YELLOW   APPearance CLEAR  CLEAR   Specific Gravity, Urine 1.020  1.005 - 1.030   pH 7.0  5.0 - 8.0   Glucose, UA NEGATIVE  NEGATIVE mg/dL   Hgb urine dipstick MODERATE (*) NEGATIVE   Bilirubin Urine NEGATIVE  NEGATIVE   Ketones, ur NEGATIVE  NEGATIVE mg/dL   Protein, ur NEGATIVE  NEGATIVE mg/dL   Urobilinogen, UA 0.2  0.0 - 1.0 mg/dL   Nitrite NEGATIVE  NEGATIVE   Leukocytes, UA LARGE (*) NEGATIVE  URINE MICROSCOPIC-ADD ON     Status: Abnormal   Collection Time    01/31/14  6:42 PM      Result Value Ref Range   Squamous Epithelial / LPF MANY (*) RARE   WBC, UA 3-6  <3 WBC/hpf   RBC / HPF 3-6  <3 RBC/hpf   Bacteria, UA MANY (*) RARE  WET PREP, GENITAL     Status: Abnormal   Collection Time    01/31/14   7:20 PM      Result Value Ref Range   Yeast Wet Prep HPF POC NONE SEEN  NONE SEEN   Trich, Wet Prep NONE SEEN  NONE SEEN   Clue Cells Wet Prep HPF POC FEW (*) NONE SEEN   WBC, Wet Prep HPF POC MODERATE (*) NONE SEEN      MAU COURSE GC/CT, HIV sent Ibuprofen 800mg  po given  ASSESSMENT  1. BV (bacterial vaginosis)   2. Dysmenorrhea   3. History of PCOS   4. UTI (lower urinary tract infection)     PLAN Discharge home. See AVS for patient education. Discussed need for GYN care to treat oligomenorrhea, contraception, safe sex   Medication List         BIOTIN PO  Take 2 tablets by mouth daily.     ciprofloxacin 500 MG tablet  Commonly known as:  CIPRO  Take 1 tablet (500 mg total) by mouth 2 (two) times daily.     ibuprofen 600 MG tablet  Commonly known as:  ADVIL,MOTRIN  Take 1 tablet (600 mg total) by mouth every 6 (six) hours as needed.     metroNIDAZOLE 500 MG tablet  Commonly known as:  FLAGYL  Take 1 tablet (500 mg total) by mouth 3 (three) times daily.            Danae Orleans, CNM 01/31/2014 7:25 PM

## 2014-01-31 NOTE — MAU Note (Signed)
Pt states hx PCOS, lmp-11/26/2013. Here for abd pain and discharge with bad odor and itching x3 days. Hx BV. Last intercourse 2 weeks ago. Does have burning with urgency

## 2014-01-31 NOTE — Discharge Instructions (Signed)
Bacterial Vaginosis Bacterial vaginosis is a vaginal infection that occurs when the normal balance of bacteria in the vagina is disrupted. It results from an overgrowth of certain bacteria. This is the most common vaginal infection in women of childbearing age. Treatment is important to prevent complications, especially in pregnant women, as it can cause a premature delivery. CAUSES  Bacterial vaginosis is caused by an increase in harmful bacteria that are normally present in smaller amounts in the vagina. Several different kinds of bacteria can cause bacterial vaginosis. However, the reason that the condition develops is not fully understood. RISK FACTORS Certain activities or behaviors can put you at an increased risk of developing bacterial vaginosis, including:  Having a new sex partner or multiple sex partners.  Douching.  Using an intrauterine device (IUD) for contraception. Women do not get bacterial vaginosis from toilet seats, bedding, swimming pools, or contact with objects around them. SIGNS AND SYMPTOMS  Some women with bacterial vaginosis have no signs or symptoms. Common symptoms include:  Grey vaginal discharge.  A fishlike odor with discharge, especially after sexual intercourse.  Itching or burning of the vagina and vulva.  Burning or pain with urination. DIAGNOSIS  Your health care provider will take a medical history and examine the vagina for signs of bacterial vaginosis. A sample of vaginal fluid may be taken. Your health care provider will look at this sample under a microscope to check for bacteria and abnormal cells. A vaginal pH test may also be done.  TREATMENT  Bacterial vaginosis may be treated with antibiotic medicines. These may be given in the form of a pill or a vaginal cream. A second round of antibiotics may be prescribed if the condition comes back after treatment.  HOME CARE INSTRUCTIONS   Only take over-the-counter or prescription medicines as  directed by your health care provider.  If antibiotic medicine was prescribed, take it as directed. Make sure you finish it even if you start to feel better.  Do not have sex until treatment is completed.  Tell all sexual partners that you have a vaginal infection. They should see their health care provider and be treated if they have problems, such as a mild rash or itching.  Practice safe sex by using condoms and only having one sex partner. SEEK MEDICAL CARE IF:   Your symptoms are not improving after 3 days of treatment.  You have increased discharge or pain.  You have a fever. MAKE SURE YOU:   Understand these instructions.  Will watch your condition.  Will get help right away if you are not doing well or get worse. FOR MORE INFORMATION  Centers for Disease Control and Prevention, Division of STD Prevention: [employer]raud[employer] American Sexual Health Association (ASHA): www.ashastd.org  Document Released: 04/15/2005 Document Revised: 02/03/2013 Document Reviewed: 11/25/2012 [facility] Patient Information 2015 [place], [place]. This information is not intended to replace advice given to you by your health care provider. Make sure you discuss any questions you have with your health care provider. Contraception Choices Contraception (birth control) is the use of any methods or devices to prevent pregnancy. Below are some methods to help avoid pregnancy. HORMONAL METHODS   Contraceptive implant. This is a thin, plastic tube containing progesterone hormone. It does not contain estrogen hormone. Your health care provider inserts the tube in the inner part of the upper arm. The tube can remain in place for up to 3 years. After 3 years, the implant must be removed. The implant prevents the ovaries from  releasing an egg (ovulation), thickens the cervical mucus to prevent sperm from entering the uterus, and thins the lining of the inside of the uterus. °· Progesterone-only injections. These  injections are given every 3 months by your health care provider to prevent pregnancy. This synthetic progesterone hormone stops the ovaries from releasing eggs. It also thickens cervical mucus and changes the uterine lining. This makes it harder for sperm to survive in the uterus. °· Birth control pills. These pills contain estrogen and progesterone hormone. They work by preventing the ovaries from releasing eggs (ovulation). They also cause the cervical mucus to thicken, preventing the sperm from entering the uterus. Birth control pills are prescribed by a health care provider. Birth control pills can also be used to treat heavy periods. °· Minipill. This type of birth control pill contains only the progesterone hormone. They are taken every day of each month and must be prescribed by your health care provider. °· Birth control patch. The patch contains hormones similar to those in birth control pills. It must be changed once a week and is prescribed by a health care provider. °· Vaginal ring. The ring contains hormones similar to those in birth control pills. It is left in the vagina for 3 weeks, removed for 1 week, and then a new one is put back in place. The patient must be comfortable inserting and removing the ring from the vagina. A health care provider's prescription is necessary. °· Emergency contraception. Emergency contraceptives prevent pregnancy after unprotected sexual intercourse. This pill can be taken right after sex or up to 5 days after unprotected sex. It is most effective the sooner you take the pills after having sexual intercourse. Most emergency contraceptive pills are available without a prescription. Check with your pharmacist. Do not use emergency contraception as your only form of birth control. °BARRIER METHODS  °· Female condom. This is a thin sheath (latex or rubber) that is worn over the penis during sexual intercourse. It can be used with spermicide to increase  effectiveness. °· Female condom. This is a soft, loose-fitting sheath that is put into the vagina before sexual intercourse. °· Diaphragm. This is a soft, latex, dome-shaped barrier that must be fitted by a health care provider. It is inserted into the vagina, along with a spermicidal jelly. It is inserted before intercourse. The diaphragm should be left in the vagina for 6 to 8 hours after intercourse. °· Cervical cap. This is a round, soft, latex or plastic cup that fits over the cervix and must be fitted by a health care provider. The cap can be left in place for up to 48 hours after intercourse. °· Sponge. This is a soft, circular piece of polyurethane foam. The sponge has spermicide in it. It is inserted into the vagina after wetting it and before sexual intercourse. °· Spermicides. These are chemicals that kill or block sperm from entering the cervix and uterus. They come in the form of creams, jellies, suppositories, foam, or tablets. They do not require a prescription. They are inserted into the vagina with an applicator before having sexual intercourse. The process must be repeated every time you have sexual intercourse. °INTRAUTERINE CONTRACEPTION °· Intrauterine device (IUD). This is a T-shaped device that is put in a woman's uterus during a menstrual period to prevent pregnancy. There are 2 types: °¨ Copper IUD. This type of IUD is wrapped in copper wire and is placed inside the uterus. Copper makes the uterus and fallopian tubes produce a fluid that kills   sperm. It can stay in place for 10 years.  Hormone IUD. This type of IUD contains the hormone progestin (synthetic progesterone). The hormone thickens the cervical mucus and prevents sperm from entering the uterus, and it also thins the uterine lining to prevent implantation of a fertilized egg. The hormone can weaken or kill the sperm that get into the uterus. It can stay in place for 3-5 years, depending on which type of IUD is used. PERMANENT  METHODS OF CONTRACEPTION  Female tubal ligation. This is when the woman's fallopian tubes are surgically sealed, tied, or blocked to prevent the egg from traveling to the uterus.  Hysteroscopic sterilization. This involves placing a small coil or insert into each fallopian tube. Your doctor uses a technique called hysteroscopy to do the procedure. The device causes scar tissue to form. This results in permanent blockage of the fallopian tubes, so the sperm cannot fertilize the egg. It takes about 3 months after the procedure for the tubes to become blocked. You must use another form of birth control for these 3 months.  Female sterilization. This is when the female has the tubes that carry sperm tied off (vasectomy).This blocks sperm from entering the vagina during sexual intercourse. After the procedure, the man can still ejaculate fluid (semen). NATURAL PLANNING METHODS  Natural family planning. This is not having sexual intercourse or using a barrier method (condom, diaphragm, cervical cap) on days the woman could become pregnant.  Calendar method. This is keeping track of the length of each menstrual cycle and identifying when you are fertile.  Ovulation method. This is avoiding sexual intercourse during ovulation.  Symptothermal method. This is avoiding sexual intercourse during ovulation, using a thermometer and ovulation symptoms.  Post-ovulation method. This is timing sexual intercourse after you have ovulated. Regardless of which type or method of contraception you choose, it is important that you use condoms to protect against the transmission of sexually transmitted infections (STIs). Talk with your health care provider about which form of contraception is most appropriate for you. Document Released: 04/15/2005 Document Revised: 04/20/2013 Document Reviewed: 10/08/2012 [facility] Patient Information 2015 [place], [place]. This information is not intended to replace advice given to you by  your health care provider. Make sure you discuss any questions you have with your health care provider. Polycystic Ovarian Syndrome Polycystic ovarian syndrome (PCOS) is a common hormonal disorder among women of reproductive age. Most women with PCOS grow many small cysts on their ovaries. PCOS can cause problems with your periods and make it difficult to get pregnant. It can also cause an increased risk of miscarriage with pregnancy. If left untreated, PCOS can lead to serious health problems, such as diabetes and heart disease. CAUSES The cause of PCOS is not fully understood, but genetics may be a factor. SIGNS AND SYMPTOMS   Infrequent or no menstrual periods.   Inability to get pregnant (infertility) because of not ovulating.   Increased growth of hair on the face, chest, stomach, back, thumbs, thighs, or toes.   Acne, oily skin, or dandruff.   Pelvic pain.   Weight gain or obesity, usually carrying extra weight around the waist.   Type 2 diabetes.   High cholesterol.   High blood pressure.   Female-pattern baldness or thinning hair.   Patches of thickened and dark brown or black skin on the neck, arms, breasts, or thighs.   Tiny excess flaps of skin (skin tags) in the armpits or neck area.   Excessive snoring and having  breathing stop at times while asleep (sleep apnea).   Deepening of the voice.   Gestational diabetes when pregnant.  DIAGNOSIS  There is no single test to diagnose PCOS.   Your health care provider will:   Take a medical history.   Perform a pelvic exam.   Have ultrasonography done.   Check your female and female hormone levels.   Measure glucose or sugar levels in the blood.   Do other blood tests.   If you are producing too many female hormones, your health care provider will make sure it is from PCOS. At the physical exam, your health care provider will want to evaluate the areas of increased hair growth. Try to allow  natural hair growth for a few days before the visit.   During a pelvic exam, the ovaries may be enlarged or swollen because of the increased number of small cysts. This can be seen more easily by using vaginal ultrasonography or screening to examine the ovaries and lining of the uterus (endometrium) for cysts. The uterine lining may become thicker if you have not been having a regular period.  TREATMENT  Because there is no cure for PCOS, it needs to be managed to prevent problems. Treatments are based on your symptoms. Treatment is also based on whether you want to have a baby or whether you need contraception.  Treatment may include:   Progesterone hormone to start a menstrual period.   Birth control pills to make you have regular menstrual periods.   Medicines to make you ovulate, if you want to get pregnant.   Medicines to control your insulin.   Medicine to control your blood pressure.   Medicine and diet to control your high cholesterol and triglycerides in your blood.  Medicine to reduce excessive hair growth.  Surgery, making small holes in the ovary, to decrease the amount of female hormone production. This is done through a long, lighted tube (laparoscope) placed into the pelvis through a tiny incision in the lower abdomen.  Prenatal Care [facility]    [facility]  [phone]     Phone: [phone]          [facility]                      [facility]  @[place]     Phone: [phone]  Phone: [phone]         [facility]     Phone: [phone]  Phone: [phone]           [facility] @ [place]                hone: [phone]  Phone: [phone]         [facility] [address] Francoise Ceo      Phone: [phone]  Phone: [phone]         [facility] Dept.                 Phone: [phone]  [facility]   [phone]    [facility] [address] [place])          Phone: [phone] [facility]   Phone: [phone] CARE INSTRUCTIONS  Only take over-the-counter or prescription medicine as directed by your health care provider.  Pay attention to the foods you eat and your  activity levels. This can help reduce the effects of PCOS.  Keep your weight under control.  Eat foods that are low in carbohydrate and high in fiber.  Exercise regularly. SEEK MEDICAL CARE IF:  Your symptoms do not get better with medicine.  You have new symptoms. Document Released: 08/09/2004 Document Revised: 02/03/2013 Document Reviewed: 10/01/2012 [facility] Patient Information 2015 [place], [place]. This information is not intended to replace advice given to you by your health care provider. Make sure you discuss any questions you have with your health care provider.

## 2014-02-01 ENCOUNTER — Telehealth: Payer: Self-pay

## 2014-02-01 LAB — HIV ANTIBODY (ROUTINE TESTING W REFLEX): HIV: NONREACTIVE

## 2014-02-01 LAB — GC/CHLAMYDIA PROBE AMP
CT PROBE, AMP APTIMA: POSITIVE — AB
GC Probe RNA: NEGATIVE

## 2014-02-01 MED ORDER — AZITHROMYCIN 500 MG PO TABS: 1000.0000 mg | ORAL_TABLET | Freq: Every day | ORAL | Status: DC

## 2014-02-01 NOTE — Telephone Encounter (Signed)
Message copied by Louanna RawAMPBELL, Madilyne Tadlock M on Tue Feb 01, 2014  9:21 AM ------      Message from: Pennie BanterSMITH, MARNI W      Created: Tue Feb 01, 2014  8:58 AM       Telephone call to patient regarding positive chlamydia culture, patient notified.  Patient has not been treated and will need [facility] [address] [facility] [address]  Instructed patient to notify her partner for treatment.  Report faxed to health department. ------

## 2014-02-01 NOTE — Telephone Encounter (Signed)
Zithromax 1gm e-prescribed per protocol.

## 2014-02-03 NOTE — MAU Provider Note (Signed)
Attestation of Attending Supervision of Advanced Practitioner (CNM/NP): Evaluation and management procedures were performed by the Advanced Practitioner under my supervision and collaboration. I have reviewed the Advanced Practitioner's note and chart, and I agree with the management and plan.  Ryleah Miramontes H. 10:09 AM

## 2014-02-17 IMAGING — US US OB FOLLOW-UP
1 series · 12 of 28 positions shown · non-contrast
Comparison: none

[Series 1: us ob detail +14 wk · 12 of 65 slices shown]
[im 3/65]
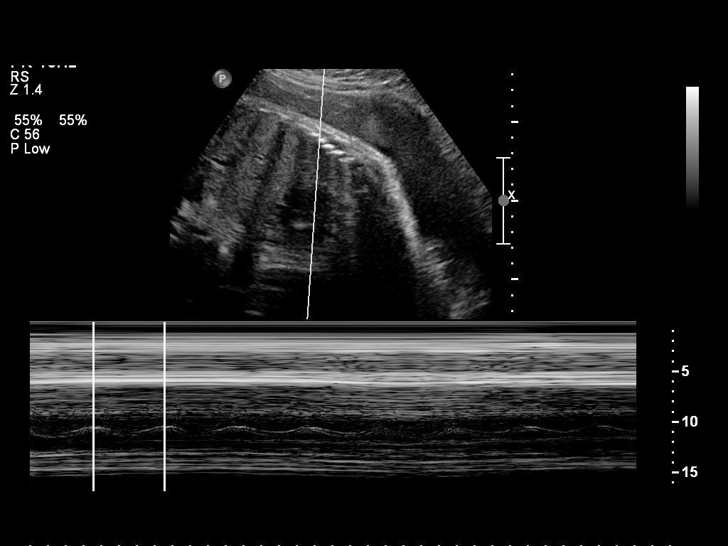
[im 8/65]
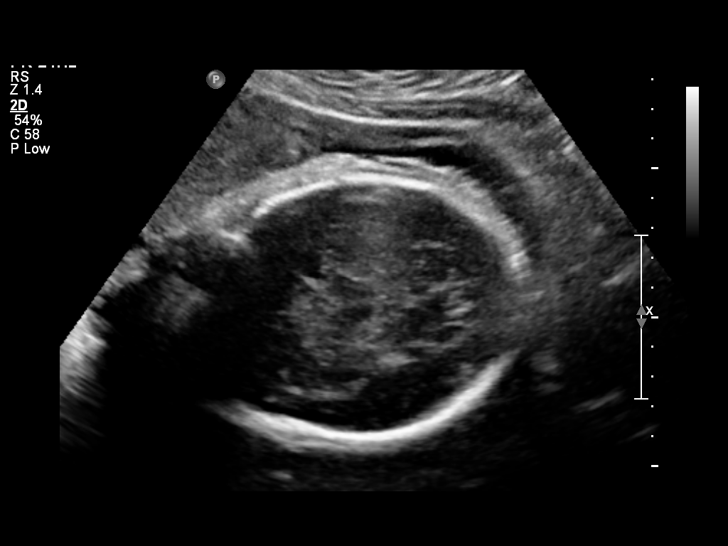
[im 12/65]
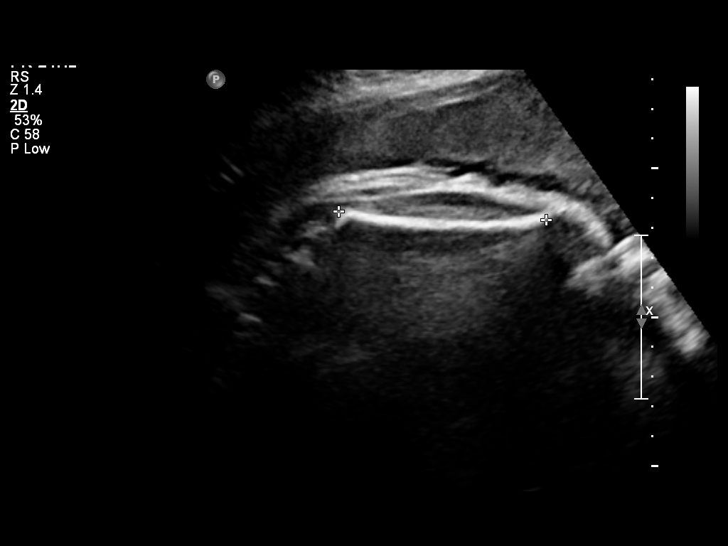
[im 19/65]
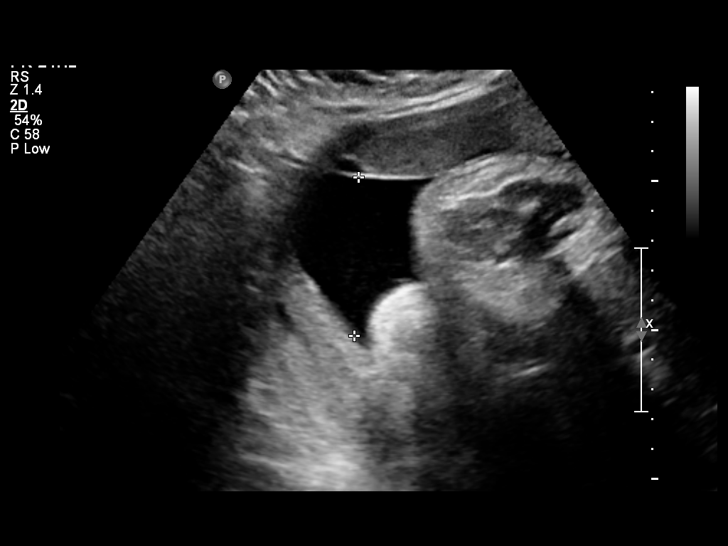
[im 24/65]
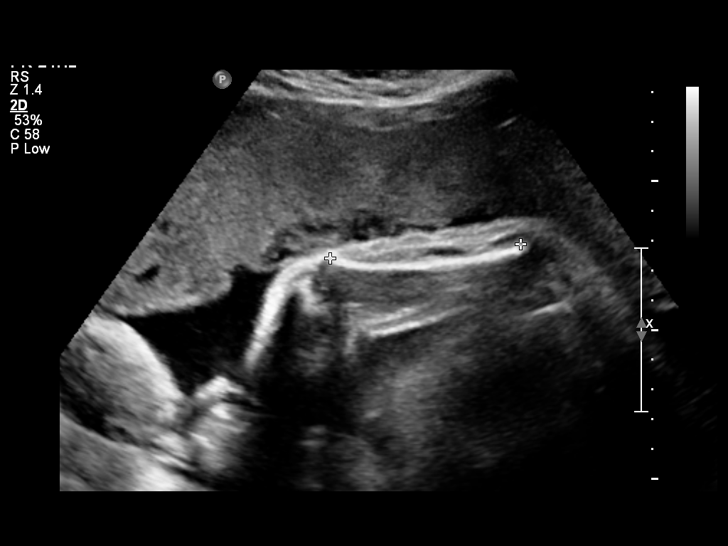
[im 29/65]
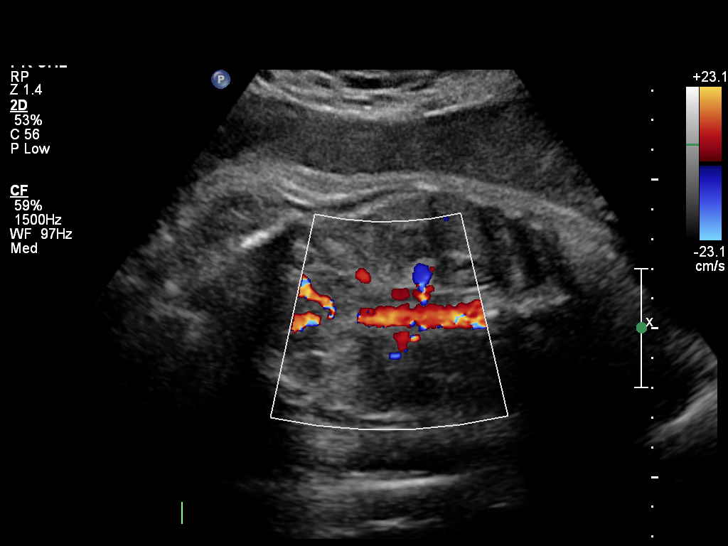
[im 36/65]
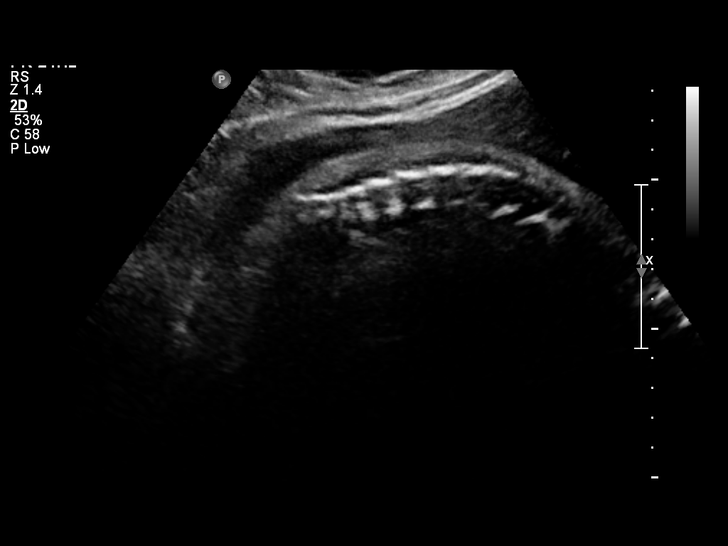
[im 41/65]
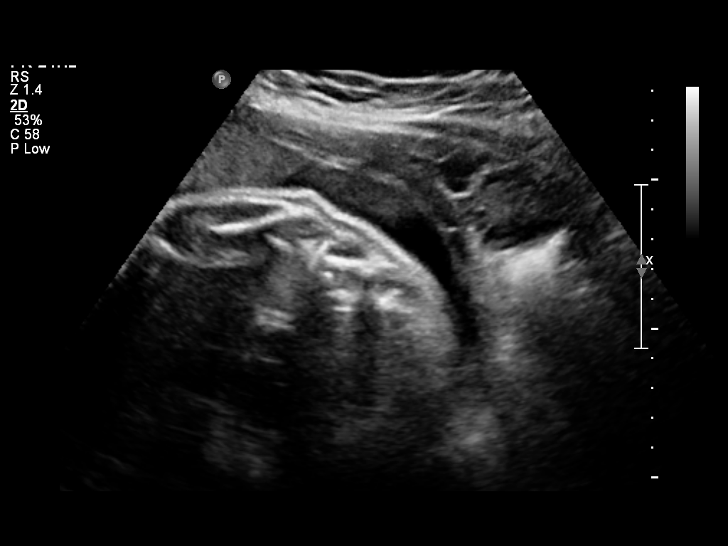
[im 46/65]
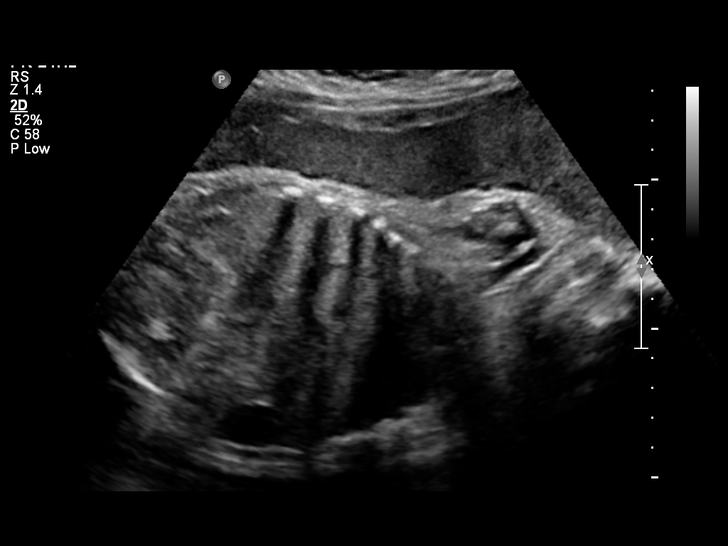
[im 53/65]
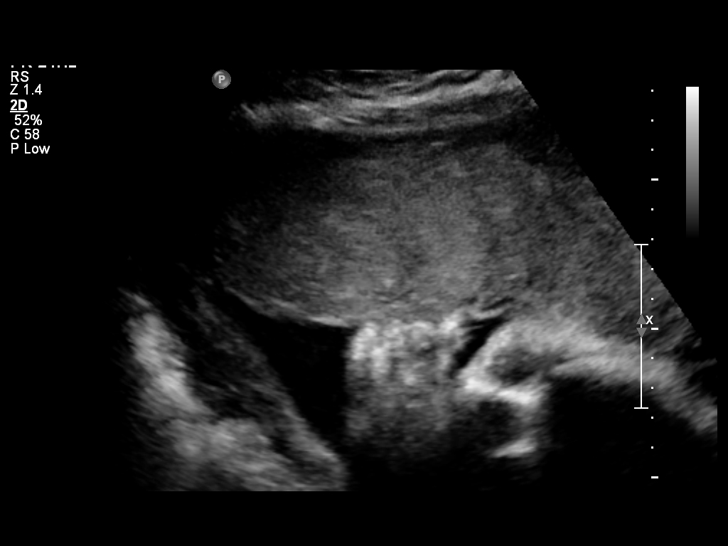
[im 57/65]
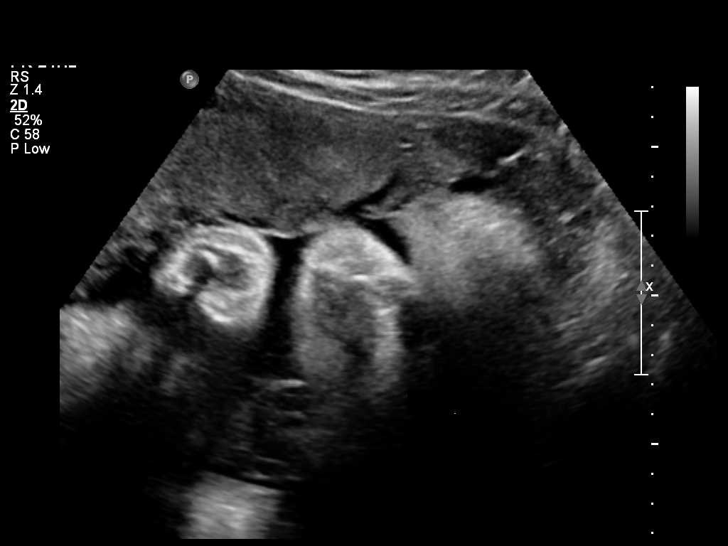
[im 62/65]
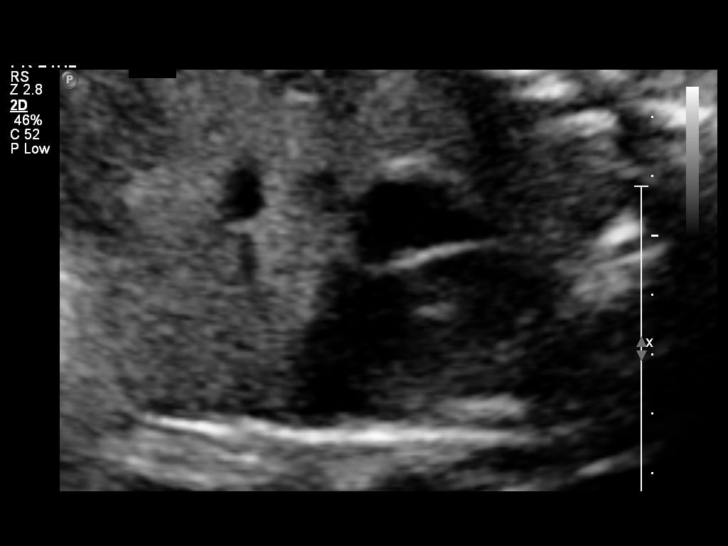

[12 of 28 positions shown; findings below may reference images not displayed]

OBSTETRICS REPORT
                      (Signed Final 10/21/2011 [DATE])

 Order#:         [id number]
Procedures

 US OB Follow Up                                       76816.1
Indications

 Size greater than dates (Large for gestational [AGE]
 Obesity
Fetal Evaluation

 Fetal Heart Rate:  139                         bpm
 Cardiac Activity:  Observed
 Presentation:      Cephalic
 Placenta:          Anterior, above cervical os

 Amniotic Fluid
 AFI FV:      Subjectively within normal limits
 AFI Sum:     17.83   cm      66   %Tile     Larg Pckt:   5.31   cm
 RUQ:   2.76   cm    RLQ:    5.31   cm    LUQ:   5.12    cm   LLQ:    4.64   cm
Biometry

 BPD:     87.9  mm    G. Age:   35w 4d                CI:           74   70 - 86
                                                      FL/HC:      21.5   20.1 -

 HC:     324.5  mm    G. Age:   36w 5d       47  %    HC/AC:      1.03   0.93 -

 AC:       316  mm    G. Age:   35w 4d       57  %    FL/BPD:     79.4   71 - 87
 FL:      69.8  mm    G. Age:   35w 6d       50  %    FL/AC:      22.1   20 - 24
 HUM:     62.7  mm    G. Age:   36w 3d       79  %

 Est. FW:    2712  gm      6 lb 1 oz     66  %
Gestational Age

 U/S Today:     35w 6d                                        EDD:   11/19/11
 Best:          35w 4d    Det. By:   Previous Ultrasound      EDD:   11/21/11
Anatomy

 Cranium:           Appears normal      Aortic Arch:       Not well
                                                           visualized
 Fetal Cavum:       Previously seen     Ductal Arch:       Not well
                                                           visualized
 Ventricles:        Appears normal      Diaphragm:         Appears normal
 Choroid Plexus:    Previously seen     Stomach:           Appears normal
 Cerebellum:        Previously seen     Abdomen:           Appears normal
 Posterior Fossa:   Previously seen     Abdominal Wall:    Appears nml
                                                           (cord insert,
                                                           abd wall)
 Nuchal Fold:       Not applicable      Cord Vessels:      Appears normal
                    (>20 wks GA)                           (3 vessel cord)
 Face:              Appears normal      Kidneys:           Appear normal
                    (lips/profile/orbit
                    s)
 Heart:             Appears normal      Bladder:           Appears normal
                    (4 chamber &
                    axis)
 RVOT:              Appears normal      Spine:             Appears normal
 LVOT:              Appears normal      Limbs:             Four extremities
                                                           seen

 Other:     Technically difficult due to maternal habitus and fetal
            position. Female gender.
Cervix Uterus Adnexa

 Cervix:       Not visualized (advanced GA >34 wks)
 Left Ovary:   Previously seen.
 Right Ovary:  Previously seen
Impression

 Assigned GA is currently 35w 4d.   Appropriate fetal growth,
 with EFW at 66 %ile.
 Amniotic fluid within normal limits, with AFI of 17.83 cm.
 No late developing fetal abnormalities seen involving
 visualized anatomy.

## 2014-02-20 IMAGING — US US FETAL BPP W/O NONSTRESS
1 series · 7 of 7 positions shown · non-contrast
Comparison: none

CLINICAL DATA: Nonreactive nonstress test and decreased fetal
movement

BIOPHYSICAL PROFILE
Number of Fetuses: 1
Heart Rate: 139 bpm
Presentation: Cephalic
Movement: Yes
Amniotic Fluid (Subjective):  Normal
Vertical pocket:  7.8 cm
BPP:
Movement:  2      Time:  10 min
Breathing: 2
Tone:   2
Amniotic Fluid:  2
Total Score:  8

[Series 1: us fetal bpp w/o nonstress · non-contrast · 7 acquisitions, 7 frames shown]
[im 1/7]
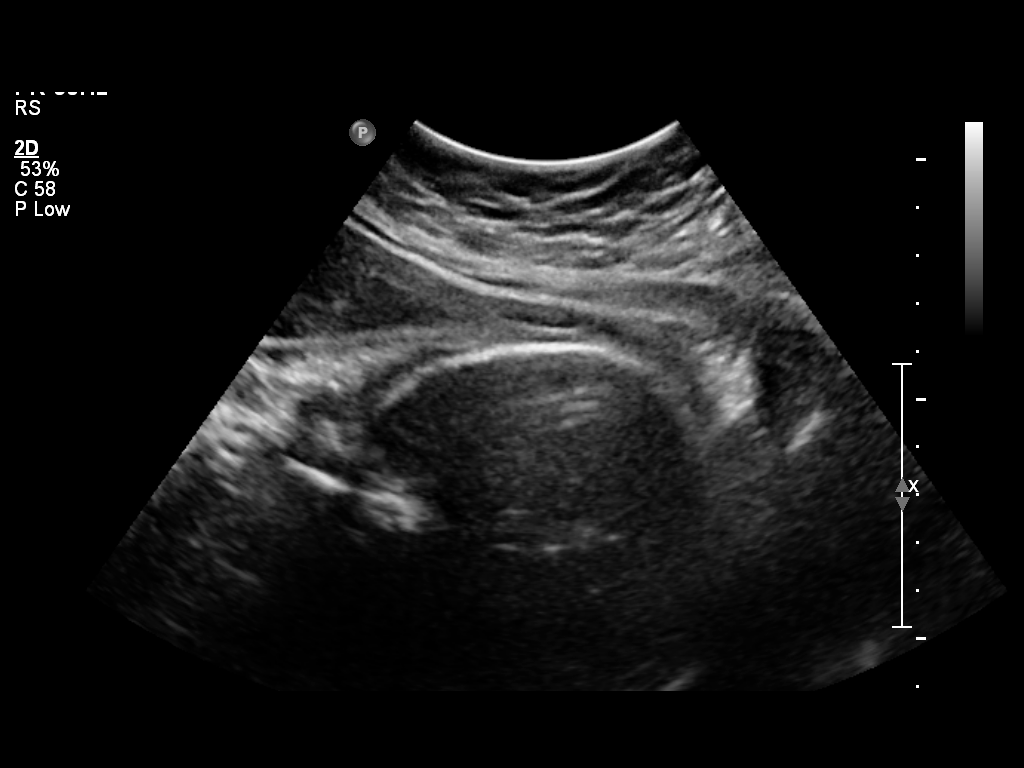
[im 2/7]
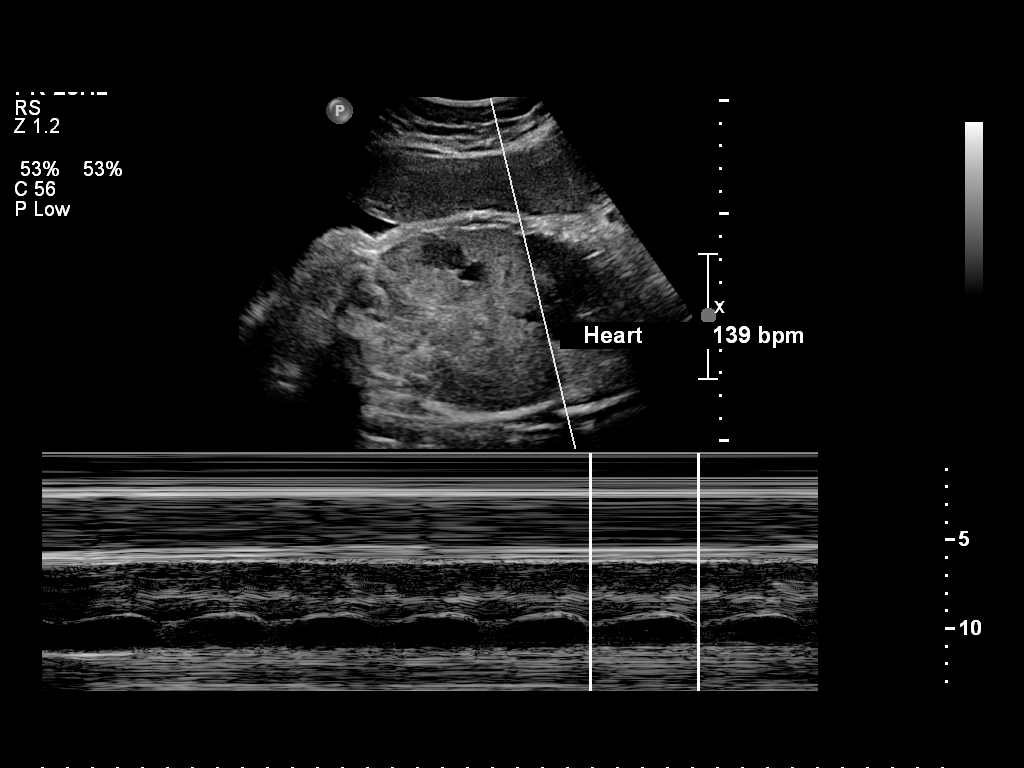
[im 3/7]
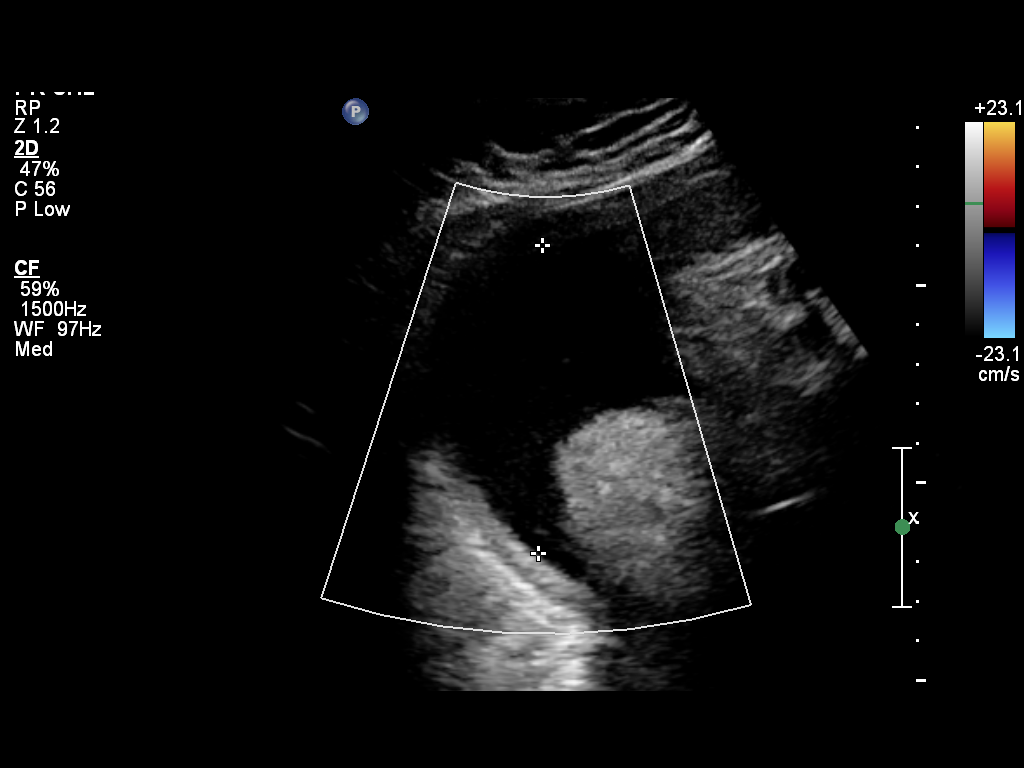
[im 4/7]
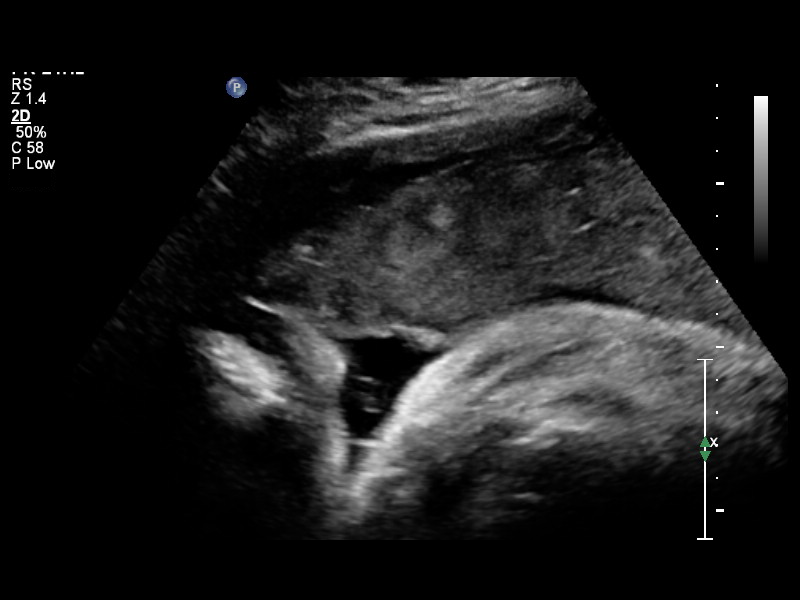
[im 5/7]
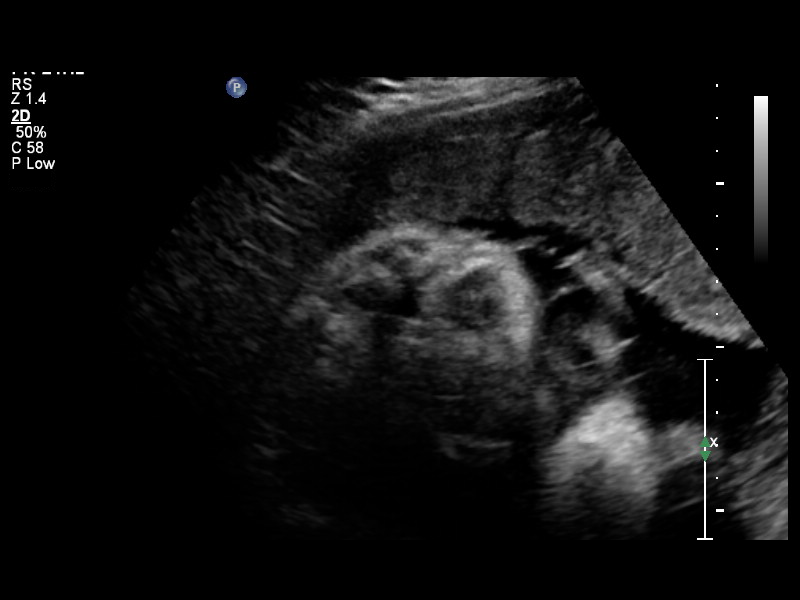
[im 6/7]
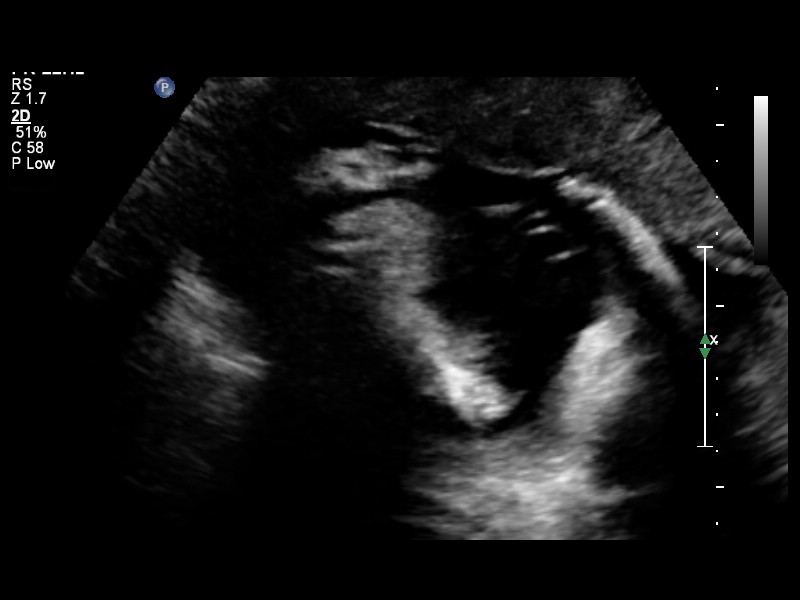
[im 7/7]
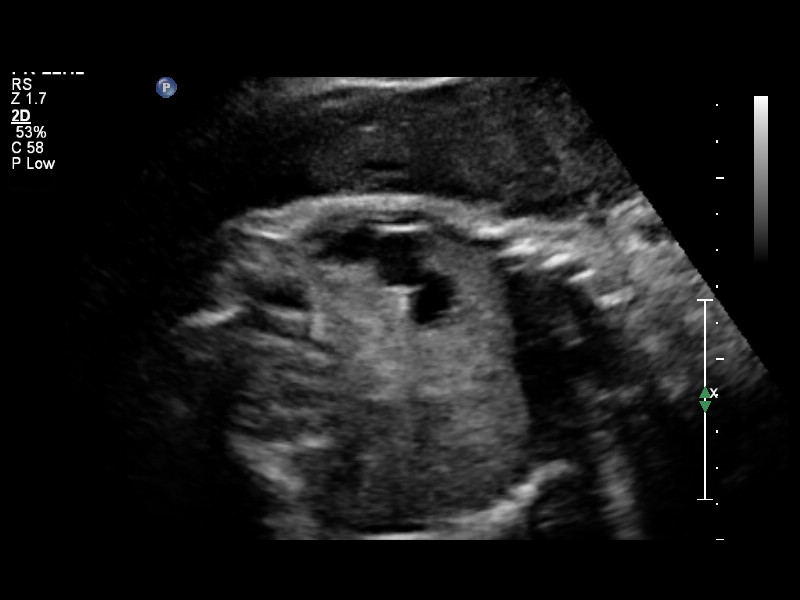

[7 of 7 positions shown; findings below may reference images not displayed]

IMPRESSION: The biophysical score is [DATE].

Recommend followup with non-emergent complete OB 14+ wk US
examination for fetal biometric evaluation and anatomic survey if
not already performed.

## 2014-02-28 ENCOUNTER — Encounter (HOSPITAL_COMMUNITY): Payer: Self-pay | Admitting: *Deleted

## 2014-03-03 ENCOUNTER — Encounter (HOSPITAL_COMMUNITY): Payer: Self-pay | Admitting: *Deleted

## 2014-03-03 ENCOUNTER — Inpatient Hospital Stay (HOSPITAL_COMMUNITY)
Admission: AD | Admit: 2014-03-03 | Discharge: 2014-03-03 | Disposition: A | Discharge status: Home / Self Care | Payer: Medicaid Other | Source: Ambulatory Visit | Attending: Obstetrics and Gynecology | Admitting: Obstetrics and Gynecology

## 2014-03-03 DIAGNOSIS — F17210 Nicotine dependence, cigarettes, uncomplicated: Secondary | ICD-10-CM | POA: Insufficient documentation

## 2014-03-03 DIAGNOSIS — Z793 Long term (current) use of hormonal contraceptives: Secondary | ICD-10-CM | POA: Insufficient documentation

## 2014-03-03 DIAGNOSIS — N920 Excessive and frequent menstruation with regular cycle: Secondary | ICD-10-CM | POA: Insufficient documentation

## 2014-03-03 LAB — WET PREP, GENITAL
Clue Cells Wet Prep HPF POC: NONE SEEN
TRICH WET PREP: NONE SEEN
YEAST WET PREP: NONE SEEN

## 2014-03-03 LAB — URINALYSIS, ROUTINE W REFLEX MICROSCOPIC
Bilirubin Urine: NEGATIVE
Glucose, UA: NEGATIVE mg/dL
Ketones, ur: NEGATIVE mg/dL
LEUKOCYTES UA: NEGATIVE
Nitrite: NEGATIVE
PH: 7 (ref 5.0–8.0)
Protein, ur: NEGATIVE mg/dL
SPECIFIC GRAVITY, URINE: 1.025 (ref 1.005–1.030)
Urobilinogen, UA: 0.2 mg/dL (ref 0.0–1.0)

## 2014-03-03 LAB — CBC
HCT: 37.3 % (ref 36.0–46.0)
Hemoglobin: 12.3 g/dL (ref 12.0–15.0)
MCH: 28.9 pg (ref 26.0–34.0)
MCHC: 33 g/dL (ref 30.0–36.0)
MCV: 87.6 fL (ref 78.0–100.0)
Platelets: 332 10*3/uL (ref 150–400)
RBC: 4.26 MIL/uL (ref 3.87–5.11)
RDW: 14.2 % (ref 11.5–15.5)
WBC: 7.9 10*3/uL (ref 4.0–10.5)

## 2014-03-03 LAB — POCT PREGNANCY, URINE: Preg Test, Ur: NEGATIVE

## 2014-03-03 LAB — URINE MICROSCOPIC-ADD ON

## 2014-03-03 NOTE — MAU Note (Signed)
Patient states she has had vaginal bleeding every day for 1 1/2 weeks. Yesterday was heavy and filling tampons and pads. Having abdominal and back pain. Taking BCP's as prescribed.

## 2014-03-03 NOTE — Discharge Instructions (Signed)
Take 3 birth control pills per day for 3 days, then 2 pills per day for 3 days, then 1 pill per day as you normally would. Your bleeding should stop in about 2 days.   Levonorgestrel intrauterine device (IUD) What is this medicine? LEVONORGESTREL IUD (LEE voe nor jes trel) is a contraceptive (birth control) device. The device is placed inside the uterus by a healthcare professional. It is used to prevent pregnancy and can also be used to treat heavy bleeding that occurs during your period. Depending on the device, it can be used for 3 to 5 years. This medicine may be used for other purposes; ask your health care provider or pharmacist if you have questions. COMMON BRAND NAME(S): Elveria RoyalsLILETTA, Mirena, Skyla What should I tell my health care provider before I take this medicine? They need to know if you have any of these conditions: -abnormal Pap smear -cancer of the breast, uterus, or cervix -diabetes -endometritis -genital or pelvic infection now or in the past -have more than one sexual partner or your partner has more than one partner -heart disease -history of an ectopic or tubal pregnancy -immune system problems -IUD in place -liver disease or tumor -problems with blood clots or take blood-thinners -use intravenous drugs -uterus of unusual shape -vaginal bleeding that has not been explained -an unusual or allergic reaction to levonorgestrel, other hormones, silicone, or polyethylene, medicines, foods, dyes, or preservatives -pregnant or trying to get pregnant -breast-feeding How should I use this medicine? This device is placed inside the uterus by a health care professional. Talk to your pediatrician regarding the use of this medicine in children. Special care may be needed. Overdosage: If you think you have taken too much of this medicine contact a poison control center or emergency room at once. NOTE: This medicine is only for you. Do not share this medicine with others. What if I  miss a dose? This does not apply. What may interact with this medicine? Do not take this medicine with any of the following medications: -amprenavir -bosentan -fosamprenavir This medicine may also interact with the following medications: -aprepitant -barbiturate medicines for inducing sleep or treating seizures -bexarotene -griseofulvin -medicines to treat seizures like carbamazepine, ethotoin, felbamate, oxcarbazepine, phenytoin, topiramate -modafinil -pioglitazone -rifabutin -rifampin -rifapentine -some medicines to treat HIV infection like atazanavir, indinavir, lopinavir, nelfinavir, tipranavir, ritonavir -St. John's wort -warfarin This list may not describe all possible interactions. Give your health care provider a list of all the medicines, herbs, non-prescription drugs, or dietary supplements you use. Also tell them if you smoke, drink alcohol, or use illegal drugs. Some items may interact with your medicine. What should I watch for while using this medicine? Visit your doctor or health care professional for regular check ups. See your doctor if you or your partner has sexual contact with others, becomes HIV positive, or gets a sexual transmitted disease. This product does not protect you against HIV infection (AIDS) or other sexually transmitted diseases. You can check the placement of the IUD yourself by reaching up to the top of your vagina with clean fingers to feel the threads. Do not pull on the threads. It is a good habit to check placement after each menstrual period. Call your doctor right away if you feel more of the IUD than just the threads or if you cannot feel the threads at all. The IUD may come out by itself. You may become pregnant if the device comes out. If you notice that the IUD has come  out use a backup birth control method like condoms and call your health care provider. Using tampons will not change the position of the IUD and are okay to use during your  period. What side effects may I notice from receiving this medicine? Side effects that you should report to your doctor or health care professional as soon as possible: -allergic reactions like skin rash, itching or hives, swelling of the face, lips, or tongue -fever, flu-like symptoms -genital sores -high blood pressure -no menstrual period for 6 weeks during use -pain, swelling, warmth in the leg -pelvic pain or tenderness -severe or sudden headache -signs of pregnancy -stomach cramping -sudden shortness of breath -trouble with balance, talking, or walking -unusual vaginal bleeding, discharge -yellowing of the eyes or skin Side effects that usually do not require medical attention (report to your doctor or health care professional if they continue or are bothersome): -acne -breast pain -change in sex drive or performance -changes in weight -cramping, dizziness, or faintness while the device is being inserted -headache -irregular menstrual bleeding within first 3 to 6 months of use -nausea This list may not describe all possible side effects. Call your doctor for medical advice about side effects. You may report side effects to FDA at 1-800-FDA-1088. Where should I keep my medicine? This does not apply. NOTE: This sheet is a summary. It may not cover all possible information. If you have questions about this medicine, talk to your doctor, pharmacist, or health care provider.  2015, Elsevier/Gold Standard. (2011-05-16 13:54:04)

## 2014-03-03 NOTE — MAU Provider Note (Signed)
Chief Complaint: Vaginal Bleeding  First Provider Initiated Contact with Patient 03/03/14 1539      SUBJECTIVE HPI: Teresa [name] is a 23 y.o. [id number] female who presents with vaginal bleeding and cramping 1-1/2 weeks, occasionally heavy enough to fill a pad or tampon per hour, but less today. Is on Seasonique OCPs and has been taking them as directed. Positive chlamydia 01/31/2014. States she took azithromycin as directed.  Past Medical History  Diagnosis Date  . Asthma     childhood /grew out of it  . ADHD (attention deficit hyperactivity disorder)   . Urinary tract infection   . Trichomonas   . Fractured fibula     left  . Degenerative disk disease   . Abnormal Pap smear   . Chlamydia   . Depression     post partem  . History of PCOS   . Vaginal Pap smear, abnormal     normal since   OB History  Gravida Para Term Preterm AB SAB TAB Ectopic Multiple Living  3 1 1  0 2 1 1  0 0 1    # Outcome Date GA Lbr Len/2nd Weight Sex Delivery Anes PTL Lv  3 Term 11/27/11 [id number] 702:35 / 01:55 3.79 kg (8 lb 5.7 oz) F Vag-Spont EPI  Y  2 TAB           1 SAB              Past Surgical History  Procedure Laterality Date  . Wisdom tooth extraction    . Induced abortion    . Dilation and curettage of uterus     History   Social History  . Marital Status: Single    Spouse Name: N/A    Number of Children: N/A  . Years of Education: N/A   Occupational History  . Not on file.   Social History Main Topics  . Smoking status: Current Every Day Smoker -- 0.50 packs/day for 7 years    Types: Cigarettes  . Smokeless tobacco: Never Used  . Alcohol Use: 0.0 oz/week     Comment: occ but was a heavy user  . Drug Use: Yes    Special: Marijuana     Comment: Last use today  . Sexual Activity: Yes    Birth Control/ Protection: None     Comment: last intercourse 3 weeks ago   Other Topics Concern  . Not on file   Social History Narrative   No current facility-administered  medications on file prior to encounter.   Current Outpatient Prescriptions on File Prior to Encounter  Medication Sig Dispense Refill  . BIOTIN PO Take 2 tablets by mouth daily.     Allergies  Allergen Reactions  . Other Anaphylaxis    Grapes, strawberries, bananas, pineapple, oranges, nuts  . Latex Itching  . Tramadol Nausea And Vomiting  . Tomato Itching and Rash    ROS: Pertinent positive items in HPI. Negative for fever, chills, dizziness, vaginal discharge, dyspareunia, dysuria, urgency, frequency, flank pain, nausea, vomiting, diarrhea, constipation.  OBJECTIVE Blood pressure 134/89, pulse 102, temperature 98 F (36.7 C), temperature source Oral, resp. rate 20, height 5' 2.5" (1.588 m), weight 130.817 kg (288 lb 6.4 oz), last menstrual period 02/21/2014, SpO2 98 %. GENERAL: Well-developed, well-nourished female in no acute distress.  HEENT: Normocephalic HEART: normal rate RESP: normal effort ABDOMEN: Soft, non-tender. Positive bowel sounds 4. Negative CVA tenderness. EXTREMITIES: Nontender, no edema NEURO: Alert and oriented SPECULUM EXAM: NEFG, small amount of dark  red blood noted, cervix clean BIMANUAL: cervix closed; uterus normal size, no adnexal tenderness or masses. No cervical motion tenderness.  LAB RESULTS Results for orders placed or performed during the hospital encounter of 03/03/14 (from the past 24 hour(s))  Urinalysis, Routine w reflex microscopic     Status: Abnormal   Collection Time: 03/03/14  1:07 PM  Result Value Ref Range   Color, Urine YELLOW YELLOW   APPearance HAZY (A) CLEAR   Specific Gravity, Urine 1.025 1.005 - 1.030   pH 7.0 5.0 - 8.0   Glucose, UA NEGATIVE NEGATIVE mg/dL   Hgb urine dipstick LARGE (A) NEGATIVE   Bilirubin Urine NEGATIVE NEGATIVE   Ketones, ur NEGATIVE NEGATIVE mg/dL   Protein, ur NEGATIVE NEGATIVE mg/dL   Urobilinogen, UA 0.2 0.0 - 1.0 mg/dL   Nitrite NEGATIVE NEGATIVE   Leukocytes, UA NEGATIVE NEGATIVE  Urine  microscopic-add on     Status: Abnormal   Collection Time: 03/03/14  1:07 PM  Result Value Ref Range   Squamous Epithelial / LPF FEW (A) RARE   WBC, UA 0-2 <3 WBC/hpf   RBC / HPF 21-50 <3 RBC/hpf   Bacteria, UA FEW (A) RARE  Pregnancy, urine POC     Status: None   Collection Time: 03/03/14  1:12 PM  Result Value Ref Range   Preg Test, Ur NEGATIVE NEGATIVE  CBC     Status: None   Collection Time: 03/03/14  3:50 PM  Result Value Ref Range   WBC 7.9 4.0 - 10.5 K/uL   RBC 4.26 3.87 - 5.11 MIL/uL   Hemoglobin 12.3 12.0 - 15.0 g/dL   HCT 40.937.3 81.136.0 - 91.446.0 %   MCV 87.6 78.0 - 100.0 fL   MCH 28.9 26.0 - 34.0 pg   MCHC 33.0 30.0 - 36.0 g/dL   RDW 78.214.2 95.611.5 - 21.315.5 %   Platelets 332 150 - 400 K/uL  Wet prep, genital     Status: Abnormal   Collection Time: 03/03/14  3:50 PM  Result Value Ref Range   Yeast Wet Prep HPF POC NONE SEEN NONE SEEN   Trich, Wet Prep NONE SEEN NONE SEEN   Clue Cells Wet Prep HPF POC NONE SEEN NONE SEEN   WBC, Wet Prep HPF POC FEW (A) NONE SEEN    IMAGING No results found.  MAU COURSE  ASSESSMENT 1. Menorrhagia with regular cycle     PLAN Discharge home in stable condition. Gonorrhea/Chlamydia cultures and urine culture pending. Discussed management options for prolonged menstrual bleeding and increased likelihood of breakthrough bleeding with extended cycle OCPs. Discussed alternatives with gynecologist if prolonged or irregular bleeding continues.     Follow-up Information    [facility]   Specialty:  Obstetrics and Gynecology   Why:  As needed if symptoms worsen   Contact information:   [address] [place] [id number] [phone]       Medication List    TAKE these medications        BIOTIN PO  Take 2 tablets by mouth daily.     SEASONIQUE 0.15-0.03 &0.01 MG tablet  Generic drug:  Levonorgestrel-Ethinyl Estradiol  Take 1 tablet by mouth daily.        [place] Roberto Hlavaty, CNM 03/03/2014   5:25 PM

## 2014-03-04 ENCOUNTER — Telehealth (HOSPITAL_COMMUNITY): Payer: Self-pay | Admitting: Advanced Practice Midwife

## 2014-03-04 DIAGNOSIS — A749 Chlamydial infection, unspecified: Secondary | ICD-10-CM

## 2014-03-04 LAB — URINE CULTURE: SPECIAL REQUESTS: NORMAL

## 2014-03-04 LAB — HIV ANTIBODY (ROUTINE TESTING W REFLEX): HIV 1&2 Ab, 4th Generation: NONREACTIVE

## 2014-03-04 LAB — GC/CHLAMYDIA PROBE AMP
CT PROBE, AMP APTIMA: POSITIVE — AB
GC Probe RNA: NEGATIVE

## 2014-03-04 MED ORDER — AZITHROMYCIN 500 MG PO TABS: ORAL_TABLET | ORAL | Status: DC

## 2014-03-04 NOTE — Telephone Encounter (Signed)
Telephone call to patient regarding positive chlamydia culture, patient notified.  Patient has not been treated and will need Rx called in per protocol.  Instructed patient to notify her partner for treatment.  Instructed patient to abstain from sex for seven days after both she and partner have been treated.  Report of treatment faxed to health department.

## 2014-05-09 ENCOUNTER — Encounter (HOSPITAL_COMMUNITY): Payer: Self-pay | Admitting: *Deleted

## 2014-05-09 ENCOUNTER — Inpatient Hospital Stay (HOSPITAL_COMMUNITY)
Admission: AD | Admit: 2014-05-09 | Discharge: 2014-05-09 | Disposition: A | Discharge status: Home / Self Care | Payer: Medicaid Other | Source: Ambulatory Visit | Attending: Family Medicine | Admitting: Family Medicine

## 2014-05-09 DIAGNOSIS — F17210 Nicotine dependence, cigarettes, uncomplicated: Secondary | ICD-10-CM | POA: Diagnosis not present

## 2014-05-09 DIAGNOSIS — N926 Irregular menstruation, unspecified: Secondary | ICD-10-CM | POA: Insufficient documentation

## 2014-05-09 DIAGNOSIS — R1030 Lower abdominal pain, unspecified: Secondary | ICD-10-CM | POA: Diagnosis not present

## 2014-05-09 DIAGNOSIS — R109 Unspecified abdominal pain: Secondary | ICD-10-CM | POA: Diagnosis present

## 2014-05-09 DIAGNOSIS — E282 Polycystic ovarian syndrome: Secondary | ICD-10-CM | POA: Insufficient documentation

## 2014-05-09 LAB — WET PREP, GENITAL
Clue Cells Wet Prep HPF POC: NONE SEEN
TRICH WET PREP: NONE SEEN
YEAST WET PREP: NONE SEEN

## 2014-05-09 LAB — URINALYSIS, ROUTINE W REFLEX MICROSCOPIC
Bilirubin Urine: NEGATIVE
Glucose, UA: NEGATIVE mg/dL
Hgb urine dipstick: NEGATIVE
KETONES UR: NEGATIVE mg/dL
Nitrite: NEGATIVE
PH: 6 (ref 5.0–8.0)
Protein, ur: NEGATIVE mg/dL
Specific Gravity, Urine: >1.03 — ABNORMAL HIGH (ref 1.005–1.030)
Urobilinogen, UA: 0.2 mg/dL (ref 0.0–1.0)

## 2014-05-09 LAB — URINE MICROSCOPIC-ADD ON

## 2014-05-09 LAB — POCT PREGNANCY, URINE: PREG TEST UR: NEGATIVE

## 2014-05-09 MED ORDER — AZITHROMYCIN 500 MG PO TABS: 1000.0000 mg | ORAL_TABLET | Freq: Once | ORAL | Status: DC

## 2014-05-09 MED ORDER — KETOROLAC TROMETHAMINE 60 MG/2ML IM SOLN
60.0000 mg | Freq: Once | INTRAMUSCULAR | Status: AC
  Administered 2014-05-09: 60 mg via INTRAMUSCULAR
  Filled 2014-05-09: qty 2

## 2014-05-09 NOTE — MAU Provider Note (Signed)
History     CSN: [id number]  Arrival date and time: 05/09/14 1102   First Provider Initiated Contact with Patient 05/09/14 1251      No chief complaint on file.  HPI   [name] is a 24 y.o. female [phone] who presents with stomach pain. She has had this pain before and it went away.  The stomach pain started 1 month ago; has not taken anything over the counter for pain.  Patient is requesting something for pain; patient request Toradol/ ibuprofen.  She also complains of abnormal vaginal discharge; white and creamy. Denies odor No new sexual partners. Patient is unsure why she is having pain. She does have a history of STI's LMP: Oct; history of irregular cycles; patient has PCOS.  Patient has had chlamydia twice in the last 3 months. She states she has had problems taking the azithromycin (vomited). She states that her partner has been treated, however she is requesting another dose because she knows her body and knows she has chlamydia.   OB History    Gravida Para Term Preterm AB TAB SAB Ectopic Multiple Living   3 1 1  0 2 1 1  0 0 1      Past Medical History  Diagnosis Date  . Asthma     childhood /grew out of it  . ADHD (attention deficit hyperactivity disorder)   . Urinary tract infection   . Trichomonas   . Fractured fibula     left  . Degenerative disk disease   . Abnormal Pap smear   . Chlamydia   . Depression     post partem  . History of PCOS   . Vaginal Pap smear, abnormal     normal since    Past Surgical History  Procedure Laterality Date  . Wisdom tooth extraction    . Induced abortion    . Dilation and curettage of uterus      Family History  Problem Relation Age of Onset  . Diabetes Mother   . Hypertension Mother   . Heart disease Mother   . Diabetes Maternal Grandfather   . Anesthesia problems Neg Hx   . Hypotension Neg Hx   . Malignant hyperthermia Neg Hx   . Pseudochol deficiency Neg Hx   . Heart disease Maternal  Grandmother     chf    History  Substance Use Topics  . Smoking status: Current Every Day Smoker -- 0.50 packs/day for 7 years    Types: Cigarettes  . Smokeless tobacco: Never Used  . Alcohol Use: 0.0 oz/week     Comment: occ but was a heavy user    Allergies:  Allergies  Allergen Reactions  . Other Anaphylaxis    Grapes, strawberries, bananas, pineapple, oranges, nuts  . Latex Itching  . Tramadol Nausea And Vomiting  . Tomato Itching and Rash    Prescriptions prior to admission  Medication Sig Dispense Refill Last Dose  . azithromycin (ZITHROMAX) 500 MG tablet Take 2 tablets by mouth daily 2 tablet 0   . BIOTIN PO Take 2 tablets by mouth daily.   03/02/2014 at Unknown time  . Levonorgestrel-Ethinyl Estradiol (SEASONIQUE) 0.15-0.03 &0.01 MG tablet Take 1 tablet by mouth daily.   03/03/2014 at Unknown time   Results for orders placed or performed during the hospital encounter of 05/09/14 (from the past 48 hour(s))  Urinalysis, Routine w reflex microscopic     Status: Abnormal   Collection Time: 05/09/14 12:07 PM  Result Value  Ref Range   Color, Urine YELLOW YELLOW   APPearance CLEAR CLEAR   Specific Gravity, Urine >1.030 (H) 1.005 - 1.030   pH 6.0 5.0 - 8.0   Glucose, UA NEGATIVE NEGATIVE mg/dL   Hgb urine dipstick NEGATIVE NEGATIVE   Bilirubin Urine NEGATIVE NEGATIVE   Ketones, ur NEGATIVE NEGATIVE mg/dL   Protein, ur NEGATIVE NEGATIVE mg/dL   Urobilinogen, UA 0.2 0.0 - 1.0 mg/dL   Nitrite NEGATIVE NEGATIVE   Leukocytes, UA TRACE (A) NEGATIVE  Urine microscopic-add on     Status: Abnormal   Collection Time: 05/09/14 12:07 PM  Result Value Ref Range   Squamous Epithelial / LPF MANY (A) RARE   WBC, UA 7-10 <3 WBC/hpf   RBC / HPF 3-6 <3 RBC/hpf   Bacteria, UA FEW (A) RARE   Urine-Other MUCOUS PRESENT   Pregnancy, urine POC     Status: None   Collection Time: 05/09/14 12:36 PM  Result Value Ref Range   Preg Test, Ur NEGATIVE NEGATIVE    Comment:        THE  SENSITIVITY OF THIS METHODOLOGY IS >24 mIU/mL   Wet prep, genital     Status: Abnormal   Collection Time: 05/09/14  1:00 PM  Result Value Ref Range   Yeast Wet Prep HPF POC NONE SEEN NONE SEEN   Trich, Wet Prep NONE SEEN NONE SEEN   Clue Cells Wet Prep HPF POC NONE SEEN NONE SEEN   WBC, Wet Prep HPF POC FEW (A) NONE SEEN    Comment: MODERATE BACTERIA SEEN    Review of Systems  Constitutional: Negative for fever and chills.  Gastrointestinal: Positive for heartburn, nausea, vomiting and abdominal pain (Lower part of stomach. ).  Genitourinary: Negative for dysuria, urgency, frequency and hematuria.   Physical Exam   Blood pressure 121/77, pulse 95, temperature 98 F (36.7 C), temperature source Oral, resp. rate 16, height  (1.549 m), weight 130.092 kg (286 lb 12.8 oz), last menstrual period 02/27/2014.  Physical Exam  Constitutional: She is oriented to [place], place, and time. She appears well-developed and well-nourished. No distress.  HENT:  Head: Normocephalic.  Eyes: Pupils are equal, round, and reactive to light.  Neck: Neck supple.  Respiratory: Effort normal.  GI: Soft. She exhibits no distension. There is no tenderness. There is no rebound and no guarding.  Genitourinary:  Speculum exam: Vagina - Small amount of creamy, pale yellow, mucus like discharge, mild odor  Cervix - ectropion  Bimanual exam: Cervix closed, No CMT  Uterus non tender, normal size Adnexa non tender, no masses bilaterally GC/Chlam, wet prep done Chaperone present for exam.   Musculoskeletal: Normal range of motion.  Neurological: She is alert and oriented to [place], place, and time.  Skin: Skin is warm. She is not diaphoretic.  Psychiatric: Her behavior is normal.    MAU Course  Procedures  None  MDM GC- pending. Will treat presumptively.  Wet prep Toradol Ua UPT HIV   Assessment and Plan   A:  1. Menstrual irregularity; history of PCOS   2. Lower abdominal pain     P:  Discharge home in stable condition Bleeding precautions Azithromycin 1 gram sent to pharmacy  Ibuprofen as needed, as directed on the bottle  Condoms always Discussed the importance of PCP/GYN    Iona Hansen Rasch, NP 05/09/2014 4:52 PM

## 2014-05-09 NOTE — Discharge Instructions (Signed)
Abnormal Uterine Bleeding Abnormal uterine bleeding can affect women at various stages in life, including teenagers, women in their reproductive years, pregnant women, and women who have reached menopause. Several kinds of uterine bleeding are considered abnormal, including:  Bleeding or spotting between periods.   Bleeding after sexual intercourse.   Bleeding that is heavier or more than normal.   Periods that last longer than usual.  Bleeding after menopause.  Many cases of abnormal uterine bleeding are minor and simple to treat, while others are more serious. Any type of abnormal bleeding should be evaluated by your health care provider. Treatment will depend on the cause of the bleeding. HOME CARE INSTRUCTIONS Monitor your condition for any changes. The following actions may help to alleviate any discomfort you are experiencing:  Avoid the use of tampons and douches as directed by your health care provider.  Change your pads frequently. You should get regular pelvic exams and Pap tests. Keep all follow-up appointments for diagnostic tests as directed by your health care provider.  SEEK MEDICAL CARE IF:   Your bleeding lasts more than 1 week.   You feel dizzy at times.  SEEK IMMEDIATE MEDICAL CARE IF:   You pass out.   You are changing pads every 15 to 30 minutes.   You have abdominal pain.  You have a fever.   You become sweaty or weak.   You are passing large blood clots from the vagina.   You start to feel nauseous and vomit. MAKE SURE YOU:   Understand these instructions.  Will watch your condition.  Will get help right away if you are not doing well or get worse. Document Released: 04/15/2005 Document Revised: 04/20/2013 Document Reviewed: 11/12/2012 ExitCare Patient Information 2015 [employer]. This information is not intended to replace advice given to you by your health care provider. Make sure you discuss any questions you have with your  health care provider.  

## 2014-05-09 NOTE — MAU Note (Signed)
Pt states here for lower abdominal pain x1 month. Was taking bc pills however missed some. Has white non odorous vaginal discharge.

## 2014-05-10 ENCOUNTER — Telehealth: Payer: Self-pay

## 2014-05-10 DIAGNOSIS — A749 Chlamydial infection, unspecified: Secondary | ICD-10-CM

## 2014-05-10 LAB — HIV ANTIBODY (ROUTINE TESTING W REFLEX): HIV-1/HIV-2 Ab: NONREACTIVE

## 2014-05-10 LAB — GC/CHLAMYDIA PROBE AMP
CT Probe RNA: POSITIVE — AB
GC PROBE AMP APTIMA: NEGATIVE

## 2014-05-10 MED ORDER — DOXYCYCLINE HYCLATE 100 MG PO CAPS: 100.0000 mg | ORAL_CAPSULE | Freq: Two times a day (BID) | ORAL | Status: DC

## 2014-05-10 NOTE — Telephone Encounter (Signed)
Dr. Jolayne Pantheronstant reviewed chart and stated doxycycline 100mg  BID X7 days should be ordered in replace of zithromax for treatment of +chlamydia. Medication ordered. Called patient and informed her of RX at pharmacy.Patient verbalized understanding and gratitude. No questions or concerns.

## 2014-05-10 NOTE — Telephone Encounter (Signed)
-----   Message from Pennie BanterMarni W Smith sent at 05/10/2014  2:17 PM EST ----- Telephone call to patient regarding positive chlamydia culture, patient notified.  Patient states she again threw up her Zithromax.  She is requesting alternative medication.  Per patient she has been able to tolerate Doxycycline in the past.  Please inquire about alternate Rx for patient.  [facility] [address] [facility] [address] [facility] [address].  Report faxed to health department.

## 2014-05-22 ENCOUNTER — Inpatient Hospital Stay (HOSPITAL_COMMUNITY)
Admission: AD | Admit: 2014-05-22 | Discharge: 2014-05-22 | Disposition: A | Discharge status: Home / Self Care | Payer: Medicaid Other | Source: Ambulatory Visit | Attending: Obstetrics and Gynecology | Admitting: Obstetrics and Gynecology

## 2014-05-22 DIAGNOSIS — L293 Anogenital pruritus, unspecified: Secondary | ICD-10-CM | POA: Diagnosis present

## 2014-05-22 DIAGNOSIS — A5602 Chlamydial vulvovaginitis: Secondary | ICD-10-CM | POA: Insufficient documentation

## 2014-05-22 DIAGNOSIS — A749 Chlamydial infection, unspecified: Secondary | ICD-10-CM

## 2014-05-22 DIAGNOSIS — F17210 Nicotine dependence, cigarettes, uncomplicated: Secondary | ICD-10-CM | POA: Insufficient documentation

## 2014-05-22 DIAGNOSIS — B373 Candidiasis of vulva and vagina: Secondary | ICD-10-CM | POA: Insufficient documentation

## 2014-05-22 DIAGNOSIS — L292 Pruritus vulvae: Secondary | ICD-10-CM

## 2014-05-22 MED ORDER — FLUCONAZOLE 150 MG PO TABS
150.0000 mg | ORAL_TABLET | ORAL | Status: DC | PRN
Start: 2014-05-22 — End: 2014-06-17

## 2014-05-22 NOTE — MAU Provider Note (Signed)
Chief Complaint: recheck stds    First Provider Initiated Contact with Patient 05/22/14 1552     SUBJECTIVE HPI: Teresa [name] is a 24 y.o. [id number] non-pregnant female who presents who MAU requesting Chlamydia TOC. She is also concerned because she has developed vaginal itching and the past 2 days. She was diagnosed with Chlamydia 05/09/2014. Given azithromycin which she vomited. [facility] do[facility] which she took the full course of.  Denies fever, chills, pain. Has not resumed intercourse. States she's no longer in a relationship with her boyfriend.  Past Medical History  Diagnosis Date  . Asthma     childhood /grew out of it  . ADHD (attention deficit hyperactivity disorder)   . Urinary tract infection   . Trichomonas   . Fractured fibula     left  . Degenerative disk disease   . Abnormal Pap smear   . Chlamydia   . Depression     post partem  . History of PCOS   . Vaginal Pap smear, abnormal     normal since   OB History  Gravida Para Term Preterm AB SAB TAB Ectopic Multiple Living  3 1 1  0 2 1 1  0 0 1    # Outcome Date GA Lbr Len/2nd Weight Sex Delivery Anes PTL Lv  3 Term 11/27/11 [id number] 702:35 / 01:55 3.79 kg (8 lb 5.7 oz) F Vag-Spont EPI  Y  2 TAB           1 SAB              Past Surgical History  Procedure Laterality Date  . Wisdom tooth extraction    . Induced abortion    . Dilation and curettage of uterus     History   Social History  . Marital Status: Single    Spouse Name: N/A    Number of Children: N/A  . Years of Education: N/A   Occupational History  . Not on file.   Social History Main Topics  . Smoking status: Current Every Day Smoker -- 0.50 packs/day for 7 years    Types: Cigarettes  . Smokeless tobacco: Never Used  . Alcohol Use: 0.0 oz/week     Comment: occ but was a heavy user  . Drug Use: Yes    Special: Marijuana     Comment: Last use today  . Sexual Activity: Yes    Birth  Control/ Protection: None     Comment: last intercourse 3 weeks ago   Other Topics Concern  . Not on file   Social History Narrative   No current facility-administered medications on file prior to encounter.   Current Outpatient Prescriptions on File Prior to Encounter  Medication Sig Dispense Refill  . azithromycin (ZITHROMAX) 500 MG tablet Take 2 tablets (1,000 mg total) by mouth once. 2 tablet 0  . BIOTIN PO Take 2 tablets by mouth daily.    Marland Kitchen. doxycycline (VIBRAMYCIN) 100 MG capsule Take 1 capsule (100 mg total) by mouth 2 (two) times daily. 14 capsule 0  . Levonorgestrel-Ethinyl Estradiol (SEASONIQUE) 0.15-0.03 &0.01 MG tablet Take 1 tablet by mouth daily.     Allergies  Allergen Reactions  . Other Anaphylaxis    Grapes, strawberries, bananas, pineapple, oranges, nuts  . Latex Itching  . Tramadol Nausea And Vomiting  . Tomato Itching and Rash    ROS: Pertinent items in HPI. Negative for  fever, chills, pain.  OBJECTIVE Blood pressure 129/90, pulse  106, temperature 98.2 F (36.8 C), resp. rate 18, last menstrual period 02/27/2014. GENERAL: Well-developed, well-nourished female in no acute distress.  HEENT: Normocephalic HEART: normal rate RESP: normal effort SPECULUM EXAM: Declined pelvic exam  LAB RESULTS No results found for this or any previous visit (from the past 24 hour(s)).  IMAGING No results found.  MAU COURSE  ASSESSMENT 1. Chlamydia infection   2. Vulvar itching    will treat for yeast infection.  PLAN To early for chlamydia test of cure. Discharge home in stable condition. Test of cure 2 weeks. Discussed safe sex practices and increased risk of PID, ectopic pregnancy, infertility and chronic pelvic pain in women with STD and PID histories.     Follow-up Information    Follow up with [facility]. Schedule an appointment as soon as possible for a visit in 2 weeks.   Specialty:  Obstetrics and Gynecology   Contact information:   [address] [place] [address] [phone]      Follow up with [facility] [place] [facility]ERN[facility] ADMISS[facility].   Why:  As needed in emergencies   Contact information:   [address] [id number] mc [place] [address] [phone]       Medication List    STOP taking these medications        azithromycin 500 MG tablet  Commonly known as:  ZITHROMAX     doxycycline 100 MG capsule  Commonly known as:  VIBRAMYCIN      TAKE these medications        BIOTIN PO  Take 2 tablets by mouth daily.     fluconazole 150 MG tablet  Commonly known as:  DIFLUCAN  Take 1 tablet (150 mg total) by mouth every three (3) days as needed. Can take additional dose three days later if symptoms persist     SEASONIQUE 0.15-0.03 &0.01 MG tablet  Generic drug:  Levonorgestrel-Ethinyl Estradiol  Take 1 tablet by mouth daily.        [place], CNM 05/22/2014  4:10 PM

## 2014-05-22 NOTE — Discharge Instructions (Signed)
Chlamydia Chlamydia is an infection. It is spread through sexual contact. Chlamydia can be in different areas of the body. These areas include the cervix, urethra, throat, or rectum. You may not know you have chlamydia because many people never develop the symptoms. Chlamydia is not difficult to treat once you know you have it. However, if it is left untreated, chlamydia can lead to more serious health problems.  CAUSES  Chlamydia is caused by bacteria. It is a sexually transmitted disease. It is passed from an infected partner during intimate contact. This contact could be with the genitals, mouth, or rectal area. Chlamydia can also be passed from mothers to babies during birth. SIGNS AND SYMPTOMS  There may not be any symptoms. This is often the case early in the infection. If symptoms develop, they may include:  Mild pain and discomfort when urinating.  Redness, soreness, and swelling (inflammation) of the rectum.  Vaginal discharge.  Painful intercourse.  Abdominal pain.  Bleeding between menstrual periods. DIAGNOSIS  To diagnose this infection, your health care provider will do a pelvic exam. Cultures will be taken of the vagina, cervix, urine, and possibly the rectum to verify the diagnosis.  TREATMENT You will be given antibiotic medicines. If you are pregnant, certain types of antibiotics will need to be avoided. Any sexual partners should also be treated, even if they do not show symptoms.  HOME CARE INSTRUCTIONS   Take your antibiotic medicine as directed by your health care provider. Finish the antibiotic even if you start to feel better.  Take medicines only as directed by your health care provider.  Inform any sexual partners about the infection. They should also be treated.  Do not have sexual contact until your health care provider tells you it is okay.  Get plenty of rest.  Eat a well-balanced diet.  Drink enough fluids to keep your urine clear or pale  yellow.  Keep all follow-up visits as directed by your health care provider. SEEK MEDICAL CARE IF:  You have painful urination.  You have abdominal pain.  You have vaginal discharge.  You have painful sexual intercourse.  You have bleeding between periods and after sex.  You have a fever. SEEK IMMEDIATE MEDICAL CARE IF:   You experience nausea or vomiting.  You experience excessive sweating (diaphoresis).  You have difficulty swallowing. MAKE SURE YOU:   Understand these instructions.  Will watch your condition.  Will get help right away if you are not doing well or get worse. Document Released: 01/23/2005 Document Revised: 08/30/2013 Document Reviewed: 12/21/2012 [facility] Patient Information 2015 [place], [place]. This information is not intended to replace advice given to you by your health care provider. Make sure you discuss any questions you have with your health care provider.  Candidal Vulvovaginitis Candidal vulvovaginitis is an infection of the vagina and vulva. The vulva is the skin around the opening of the vagina. This may cause itching and discomfort in and around the vagina.  HOME CARE  Only take medicine as told by your doctor.  Do not have sex (intercourse) until the infection is healed or as told by your doctor.  Practice safe sex.  Tell your sex partner about your infection.  Do not douche or use tampons.  Wear cotton underwear. Do not wear tight pants or panty hose.  Eat yogurt. This may help treat and prevent yeast infections. GET HELP RIGHT AWAY IF:   You have a fever.  Your problems get worse during treatment or do not  get better in 3 days.  You have discomfort, irritation, or itching in your vagina or vulva area.  You have pain after sex.  You start to get belly (abdominal) pain. MAKE SURE YOU:  Understand these instructions.  Will watch your condition.  Will get help right away if you are not doing well or get worse. Document  Released: 07/12/2008 Document Revised: 04/20/2013 Document Reviewed: 07/12/2008 [facility] Patient Information 2015 [place], [place]. This information is not intended to replace advice given to you by your health care provider. Make sure you discuss any questions you have with your health care provider.

## 2014-05-22 NOTE — MAU Note (Signed)
Pt presents to MAU stating that she wants to be retested to make sure her Chlamydia is gone. Ivonne AndrewV. Smith CNM in with pt discussing plan of care.

## 2014-06-17 ENCOUNTER — Inpatient Hospital Stay (HOSPITAL_COMMUNITY)
Admission: AD | Admit: 2014-06-17 | Discharge: 2014-06-17 | Discharge status: Against Medical Advice | Payer: Medicaid Other | Source: Ambulatory Visit | Attending: Obstetrics and Gynecology | Admitting: Obstetrics and Gynecology

## 2014-06-17 ENCOUNTER — Encounter (HOSPITAL_COMMUNITY): Payer: Self-pay | Admitting: General Practice

## 2014-06-17 DIAGNOSIS — F17210 Nicotine dependence, cigarettes, uncomplicated: Secondary | ICD-10-CM | POA: Insufficient documentation

## 2014-06-17 DIAGNOSIS — R109 Unspecified abdominal pain: Secondary | ICD-10-CM | POA: Diagnosis present

## 2014-06-17 DIAGNOSIS — B9689 Other specified bacterial agents as the cause of diseases classified elsewhere: Secondary | ICD-10-CM | POA: Insufficient documentation

## 2014-06-17 DIAGNOSIS — A499 Bacterial infection, unspecified: Secondary | ICD-10-CM

## 2014-06-17 DIAGNOSIS — N760 Acute vaginitis: Secondary | ICD-10-CM | POA: Insufficient documentation

## 2014-06-17 LAB — POCT PREGNANCY, URINE: Preg Test, Ur: NEGATIVE

## 2014-06-17 LAB — URINALYSIS, ROUTINE W REFLEX MICROSCOPIC
BILIRUBIN URINE: NEGATIVE
GLUCOSE, UA: NEGATIVE mg/dL
HGB URINE DIPSTICK: NEGATIVE
KETONES UR: NEGATIVE mg/dL
NITRITE: NEGATIVE
Protein, ur: NEGATIVE mg/dL
Specific Gravity, Urine: 1.025 (ref 1.005–1.030)
Urobilinogen, UA: 0.2 mg/dL (ref 0.0–1.0)
pH: 6 (ref 5.0–8.0)

## 2014-06-17 LAB — URINE MICROSCOPIC-ADD ON

## 2014-06-17 LAB — WET PREP, GENITAL
TRICH WET PREP: NONE SEEN
Yeast Wet Prep HPF POC: NONE SEEN

## 2014-06-17 MED ORDER — METRONIDAZOLE 500 MG PO TABS: 500.0000 mg | ORAL_TABLET | Freq: Two times a day (BID) | ORAL | Status: DC

## 2014-06-17 NOTE — MAU Note (Signed)
Here about a month ago, was dx and treated for chlamydia. Wants to be rechecked  And make sure everything is ok.  Has been having stomach pains, been itchy, doesn't feel right.

## 2014-06-17 NOTE — MAU Provider Note (Signed)
History     CSN: [id number]  Arrival date and time: 06/17/14 1319   First Provider Initiated Contact with Patient 06/17/14 1401    Chief Complaint  Patient presents with  . wants std testing    HPI   Teresa [name] is a 24 year old [id number] presenting with suprapubic abdominal pain x 1 week. The pain is mild, intermittent and alternates between sharp or dull. No aggravating or alleviating factors. Endorses vaginal itching. Denies nausea, vomiting, diarrhea, constipation, dysuria and vaginal discharge. Teresa [name] was diagnosed with chlamydia 1 month ago and was treated with doxycycline. She is very concerned that she still has chlamydia and wants to be treated again. She has been diagnosed and treated for chlamydia 3 times in past 4 months. She reports that she cannot take azithromycin because she vomits it up. Last chlamydia treatment was on 1/11 with a completed 7 day course of doxycycline. She reports condom use with her current partner.  OB History    Gravida Para Term Preterm AB TAB SAB Ectopic Multiple Living   0 0 0 1      Past Medical History  Diagnosis Date  . Asthma     childhood /grew out of it  . ADHD (attention deficit hyperactivity disorder)   . Urinary tract infection   . Trichomonas   . Fractured fibula     left  . Degenerative disk disease   . Abnormal Pap smear   . Chlamydia   . Depression     post partem  . History of PCOS   . Vaginal Pap smear, abnormal     normal since    Past Surgical History  Procedure Laterality Date  . Wisdom tooth extraction    . Induced abortion    . Dilation and curettage of uterus      Family History  Problem Relation Age of Onset  . Diabetes Mother   . Hypertension Mother   . Heart disease Mother   . Diabetes Maternal Grandfather   . Anesthesia problems Neg Hx   . Hypotension Neg Hx   . Malignant hyperthermia Neg Hx   . Pseudochol deficiency Neg Hx   . Heart disease Maternal Grandmother     chf     History  Substance Use Topics  . Smoking status: Current Every Day Smoker -- 0.50 packs/day for 7 years    Types: Cigarettes  . Smokeless tobacco: Never Used  . Alcohol Use: 0.0 oz/week     Comment: occ but was a heavy user    Allergies:  Allergies  Allergen Reactions  . Other Anaphylaxis    Grapes, strawberries, bananas, pineapple, oranges, nuts  . Latex Itching  . Tramadol Nausea And Vomiting  . Tomato Itching and Rash    Prescriptions prior to admission  Medication Sig Dispense Refill Last Dose  . BIOTIN PO Take 2 tablets by mouth daily.   Past Month at Unknown time  . fluconazole (DIFLUCAN) 150 MG tablet Take 1 tablet (150 mg total) by mouth every three (3) days as needed. Can take additional dose three days later if symptoms persist 2 tablet 1   . Levonorgestrel-Ethinyl Estradiol (SEASONIQUE) 0.15-0.03 &0.01 MG tablet Take 1 tablet by mouth daily.   05/09/2014 at Unknown time    Review of Systems  Constitutional: Negative for fever and chills.  Gastrointestinal: Positive for abdominal pain. Negative for heartburn, nausea, vomiting and diarrhea.  Genitourinary: Negative for dysuria, urgency and hematuria.  Neurological: Negative for headaches.   Physical Exam   Blood pressure 137/71, pulse 84, temperature 98.3 F (36.8 C), temperature source Oral, resp. rate 18, last menstrual period 06/10/2014.  Physical Exam  Constitutional: She is oriented to Teresa [name], place, and time. She appears well-developed and well-nourished. No distress.  Cardiovascular: Normal rate.   Respiratory: Effort normal.  GI: Soft. There is no tenderness. There is no rebound.  Genitourinary: There is no rash, tenderness or lesion on the right labia. There is no rash, tenderness or lesion on the left labia. Uterus is not tender. Cervix exhibits no motion tenderness, no discharge and no friability. Right adnexum displays no mass and no tenderness. Left adnexum displays no mass and no tenderness. No  erythema or tenderness in the vagina. No signs of injury around the vagina. Vaginal discharge (scant, white) found.  Neurological: She is alert and oriented to Teresa [name], place, and time.  Skin: Skin is warm and dry.    MAU Course  Procedures  MDM Clinical exam and wet prep support dx of BV.    Assessment and Plan  1. Bacterial Vaginosis - Flagyl x 1 week.  No etoh/IC. - GC pending - Discussion with patient about repercussions of frequent chlamydial infections (PID, infertility, ectopic, chronic pain) - Discussed appropriate barrier methods to prevent sexually transmitted disease Pt encouraged to go to HD for further STD testing.  Patient may return to MAU as needed or if her condition were to change or worsen   Barrett,Stevi M 06/17/2014, 2:01 PM   I have seen and evaluated the patient with the PA student. I agree with the assessment and plan as written above.   Bertram DenverKaren E Teague Clark, PA-C  06/18/2014 2:20 PM

## 2014-06-17 NOTE — Discharge Instructions (Signed)
Bacterial Vaginosis Bacterial vaginosis is a vaginal infection that occurs when the normal balance of bacteria in the vagina is disrupted. It results from an overgrowth of certain bacteria. This is the most common vaginal infection in women of childbearing age. Treatment is important to prevent complications, especially in pregnant women, as it can cause a premature delivery. CAUSES  Bacterial vaginosis is caused by an increase in harmful bacteria that are normally present in smaller amounts in the vagina. Several different kinds of bacteria can cause bacterial vaginosis. However, the reason that the condition develops is not fully understood. RISK FACTORS Certain activities or behaviors can put you at an increased risk of developing bacterial vaginosis, including:  Having a new sex partner or multiple sex partners.  Douching.  Using an intrauterine device (IUD) for contraception. Women do not get bacterial vaginosis from toilet seats, bedding, swimming pools, or contact with objects around them. SIGNS AND SYMPTOMS  Some women with bacterial vaginosis have no signs or symptoms. Common symptoms include:  Grey vaginal discharge.  A fishlike odor with discharge, especially after sexual intercourse.  Itching or burning of the vagina and vulva.  Burning or pain with urination. DIAGNOSIS  Your health care provider will take a medical history and examine the vagina for signs of bacterial vaginosis. A sample of vaginal fluid may be taken. Your health care provider will look at this sample under a microscope to check for bacteria and abnormal cells. A vaginal pH test may also be done.  TREATMENT  Bacterial vaginosis may be treated with antibiotic medicines. These may be given in the form of a pill or a vaginal cream. A second round of antibiotics may be prescribed if the condition comes back after treatment.  HOME CARE INSTRUCTIONS   Only take over-the-counter or prescription medicines as  directed by your health care provider.  If antibiotic medicine was prescribed, take it as directed. Make sure you finish it even if you start to feel better.  Do not have sex until treatment is completed.  Tell all sexual partners that you have a vaginal infection. They should see their health care provider and be treated if they have problems, such as a mild rash or itching.  Practice safe sex by using condoms and only having one sex partner. SEEK MEDICAL CARE IF:   Your symptoms are not improving after 3 days of treatment.  You have increased discharge or pain.  You have a fever. MAKE SURE YOU:   Understand these instructions.  Will watch your condition.  Will get help right away if you are not doing well or get worse. FOR MORE INFORMATION  Centers for Disease Control and Prevention, Division of STD Prevention: www.cdc.gov/std American Sexual Health Association (ASHA): www.ashastd.org  Document Released: 04/15/2005 Document Revised: 02/03/2013 Document Reviewed: 11/25/2012 [employer] Patient Information 2015 [employer]. This information is not intended to replace advice given to you by your health care provider. Make sure you discuss any questions you have with your health care provider.  

## 2014-06-20 LAB — GC/CHLAMYDIA PROBE AMP (~~LOC~~) NOT AT ARMC
Chlamydia: NEGATIVE
NEISSERIA GONORRHEA: NEGATIVE

## 2014-06-22 ENCOUNTER — Encounter: Payer: Self-pay | Admitting: *Deleted

## 2014-06-22 ENCOUNTER — Ambulatory Visit: Payer: Medicaid Other | Admitting: Obstetrics and Gynecology

## 2014-06-22 ENCOUNTER — Telehealth: Payer: Self-pay | Admitting: *Deleted

## 2014-06-22 NOTE — Telephone Encounter (Signed)
Teresa [name] missed a scheduled appointment for irregular cycles. Called [name] and unable to leave a message- heard a message voice mail box is full. Will send letter.

## 2014-07-07 ENCOUNTER — Emergency Department (HOSPITAL_COMMUNITY)
Admission: EM | Admit: 2014-07-07 | Discharge: 2014-07-07 | Disposition: A | Discharge status: Home / Self Care | Payer: Medicaid Other | Attending: Emergency Medicine | Admitting: Emergency Medicine

## 2014-07-07 ENCOUNTER — Encounter (HOSPITAL_COMMUNITY): Payer: Self-pay | Admitting: *Deleted

## 2014-07-07 DIAGNOSIS — Z792 Long term (current) use of antibiotics: Secondary | ICD-10-CM | POA: Diagnosis not present

## 2014-07-07 DIAGNOSIS — J020 Streptococcal pharyngitis: Secondary | ICD-10-CM | POA: Insufficient documentation

## 2014-07-07 DIAGNOSIS — J45909 Unspecified asthma, uncomplicated: Secondary | ICD-10-CM | POA: Diagnosis not present

## 2014-07-07 DIAGNOSIS — Z3202 Encounter for pregnancy test, result negative: Secondary | ICD-10-CM | POA: Insufficient documentation

## 2014-07-07 DIAGNOSIS — Z91040 Latex allergy status: Secondary | ICD-10-CM | POA: Insufficient documentation

## 2014-07-07 DIAGNOSIS — Z8781 Personal history of (healed) traumatic fracture: Secondary | ICD-10-CM | POA: Diagnosis not present

## 2014-07-07 DIAGNOSIS — R110 Nausea: Secondary | ICD-10-CM | POA: Insufficient documentation

## 2014-07-07 DIAGNOSIS — Z87440 Personal history of urinary (tract) infections: Secondary | ICD-10-CM | POA: Insufficient documentation

## 2014-07-07 DIAGNOSIS — Z8619 Personal history of other infectious and parasitic diseases: Secondary | ICD-10-CM | POA: Insufficient documentation

## 2014-07-07 DIAGNOSIS — Z720 Tobacco use: Secondary | ICD-10-CM | POA: Diagnosis not present

## 2014-07-07 DIAGNOSIS — Z8742 Personal history of other diseases of the female genital tract: Secondary | ICD-10-CM | POA: Insufficient documentation

## 2014-07-07 DIAGNOSIS — Z8659 Personal history of other mental and behavioral disorders: Secondary | ICD-10-CM | POA: Diagnosis not present

## 2014-07-07 DIAGNOSIS — J029 Acute pharyngitis, unspecified: Secondary | ICD-10-CM | POA: Diagnosis present

## 2014-07-07 LAB — CBC WITH DIFFERENTIAL/PLATELET
BASOS ABS: 0 10*3/uL (ref 0.0–0.1)
Basophils Relative: 0 % (ref 0–1)
EOS PCT: 4 % (ref 0–5)
Eosinophils Absolute: 0.5 10*3/uL (ref 0.0–0.7)
HEMATOCRIT: 37.2 % (ref 36.0–46.0)
Hemoglobin: 12.2 g/dL (ref 12.0–15.0)
Lymphocytes Relative: 23 % (ref 12–46)
Lymphs Abs: 2.5 10*3/uL (ref 0.7–4.0)
MCH: 27.2 pg (ref 26.0–34.0)
MCHC: 32.8 g/dL (ref 30.0–36.0)
MCV: 83 fL (ref 78.0–100.0)
MONOS PCT: 4 % (ref 3–12)
Monocytes Absolute: 0.5 10*3/uL (ref 0.1–1.0)
NEUTROS ABS: 7.4 10*3/uL (ref 1.7–7.7)
Neutrophils Relative %: 69 % (ref 43–77)
Platelets: 324 10*3/uL (ref 150–400)
RBC: 4.48 MIL/uL (ref 3.87–5.11)
RDW: 14.8 % (ref 11.5–15.5)
WBC: 10.8 10*3/uL — ABNORMAL HIGH (ref 4.0–10.5)

## 2014-07-07 LAB — COMPREHENSIVE METABOLIC PANEL
ALBUMIN: 3.5 g/dL (ref 3.5–5.2)
ALT: 14 U/L (ref 0–35)
AST: 21 U/L (ref 0–37)
Alkaline Phosphatase: 91 U/L (ref 39–117)
Anion gap: 4 — ABNORMAL LOW (ref 5–15)
BILIRUBIN TOTAL: 0.4 mg/dL (ref 0.3–1.2)
BUN: 6 mg/dL (ref 6–23)
CHLORIDE: 107 mmol/L (ref 96–112)
CO2: 25 mmol/L (ref 19–32)
Calcium: 8.8 mg/dL (ref 8.4–10.5)
Creatinine, Ser: 0.63 mg/dL (ref 0.50–1.10)
GFR calc Af Amer: >90 mL/min (ref >90)
GFR calc non Af Amer: >90 mL/min (ref >90)
Glucose, Bld: 98 mg/dL (ref 70–99)
Potassium: 3.9 mmol/L (ref 3.5–5.1)
Sodium: 136 mmol/L (ref 135–145)
Total Protein: 7.1 g/dL (ref 6.0–8.3)

## 2014-07-07 LAB — POC URINE PREG, ED: PREG TEST UR: NEGATIVE

## 2014-07-07 LAB — RAPID STREP SCREEN (MED CTR MEBANE ONLY): STREPTOCOCCUS, GROUP A SCREEN (DIRECT): NEGATIVE

## 2014-07-07 MED ORDER — PENICILLIN G BENZATHINE 1200000 UNIT/2ML IM SUSP
1.2000 10*6.[IU] | Freq: Once | INTRAMUSCULAR | Status: AC
  Administered 2014-07-07: 1.2 10*6.[IU] via INTRAMUSCULAR
  Filled 2014-07-07: qty 2

## 2014-07-07 MED ORDER — DEXAMETHASONE SODIUM PHOSPHATE 10 MG/ML IJ SOLN
10.0000 mg | Freq: Once | INTRAMUSCULAR | Status: AC
  Administered 2014-07-07: 10 mg via INTRAVENOUS
  Filled 2014-07-07: qty 1

## 2014-07-07 MED ORDER — SODIUM CHLORIDE 0.9 % IV BOLUS (SEPSIS)
1000.0000 mL | INTRAVENOUS | Status: AC
  Administered 2014-07-07: 1000 mL via INTRAVENOUS

## 2014-07-07 MED ORDER — ONDANSETRON HCL 4 MG/2ML IJ SOLN
4.0000 mg | INTRAMUSCULAR | Status: AC
  Administered 2014-07-07: 4 mg via INTRAVENOUS
  Filled 2014-07-07: qty 2

## 2014-07-07 MED ORDER — KETOROLAC TROMETHAMINE 30 MG/ML IJ SOLN
30.0000 mg | Freq: Once | INTRAMUSCULAR | Status: AC
  Administered 2014-07-07: 30 mg via INTRAVENOUS
  Filled 2014-07-07: qty 1

## 2014-07-07 MED ORDER — ONDANSETRON HCL 4 MG PO TABS: 4.0000 mg | ORAL_TABLET | Freq: Four times a day (QID) | ORAL | Status: DC

## 2014-07-07 NOTE — ED Notes (Signed)
Pt with sore throat and body aches for several days.

## 2014-07-07 NOTE — ED Provider Notes (Signed)
CSN: [id number]     Arrival date & time 07/07/14  1214 History   First MD Initiated Contact with Patient 07/07/14 1238     Chief Complaint  Patient presents with  . Sore Throat  . Generalized Body Aches  . Abdominal Pain   (Consider location/radiation/quality/duration/timing/severity/associated sxs/prior Treatment) HPI  Teresa [name] is a 24 yo female presenting with sore throat and generalized body aches. She states this pain started 3 days ago and has been ongoing since then.  She reports mild congestion but no significant cough.  She also reports lymph node tenderness and pain with swallowing.  She states she has had subjective fever with feeling and chilled x 2 days. She describes the pain as sharp and rates as 9/10. She reports some nausea but denies vomiting or abdominal pain.   Past Medical History  Diagnosis Date  . Asthma     childhood /grew out of it  . ADHD (attention deficit hyperactivity disorder)   . Urinary tract infection   . Trichomonas   . Fractured fibula     left  . Degenerative disk disease   . Abnormal Pap smear   . Chlamydia   . Depression     post partem  . History of PCOS   . Vaginal Pap smear, abnormal     normal since   Past Surgical History  Procedure Laterality Date  . Wisdom tooth extraction    . Induced abortion    . Dilation and curettage of uterus     Family History  Problem Relation Age of Onset  . Diabetes Mother   . Hypertension Mother   . Heart disease Mother   . Diabetes Maternal Grandfather   . Anesthesia problems Neg Hx   . Hypotension Neg Hx   . Malignant hyperthermia Neg Hx   . Pseudochol deficiency Neg Hx   . Heart disease Maternal Grandmother     chf   History  Substance Use Topics  . Smoking status: Current Every Day Smoker -- 0.50 packs/day for 7 years    Types: Cigarettes  . Smokeless tobacco: Never Used  . Alcohol Use: 0.0 oz/week     Comment: occ but was a heavy user   OB History    Gravida Para Term  Preterm AB TAB SAB Ectopic Multiple Living   3 1 1  0 2 1 1  0 0 1     Review of Systems  Constitutional: Positive for fever and chills.  HENT: Positive for congestion and sore throat.   Eyes: Negative for visual disturbance.  Respiratory: Negative for cough and shortness of breath.   Cardiovascular: Negative for chest pain and leg swelling.  Gastrointestinal: Positive for nausea. Negative for vomiting and diarrhea.  Genitourinary: Negative for dysuria.  Musculoskeletal: Negative for myalgias.  Skin: Negative for rash.  Neurological: Negative for weakness, numbness and headaches.      Allergies  Other; Latex; Tramadol; and Tomato  Home Medications   Prior to Admission medications   Medication Sig Start Date End Date Taking? Authorizing Provider  BIOTIN PO Take 2 tablets by mouth daily.   Yes Historical Provider, MD  diphenhydrAMINE (SOMINEX) 25 MG tablet Take 50 mg by mouth at bedtime as needed for allergies or sleep.   Yes Historical Provider, MD  Levonorgestrel-Ethinyl Estradiol (SEASONIQUE) 0.15-0.03 &0.01 MG tablet Take 1 tablet by mouth daily.   Yes Historical Provider, MD  metroNIDAZOLE (FLAGYL) 500 MG tablet Take 1 tablet (500 mg total) by mouth 2 (two) times  daily. 06/17/14   Bertram Denver, PA-C   BP 116/87 mmHg  Pulse 104  Temp(Src) 98.3 F (36.8 C) (Oral)  Resp 18  Ht  (1.575 m)  Wt 275 lb (124.739 kg)  BMI 50.29 kg/m2  SpO2 98%  LMP 06/10/2014 Physical Exam  Constitutional: She appears well-developed and well-nourished. No distress.  HENT:  Head: Normocephalic and atraumatic.  Mouth/Throat: Oropharyngeal exudate, posterior oropharyngeal edema and posterior oropharyngeal erythema present. No tonsillar abscesses.  Eyes: Conjunctivae are normal.  Neck: Neck supple. No thyromegaly present.  Cardiovascular: Normal rate, regular rhythm and intact distal pulses.   Pulmonary/Chest: Effort normal and breath sounds normal. No respiratory distress. She has no  wheezes. She has no rales. She exhibits no tenderness.  Abdominal: Soft. There is no tenderness.  Musculoskeletal: She exhibits no tenderness.  Lymphadenopathy:       Head (right side): Tonsillar adenopathy present.       Head (left side): Tonsillar adenopathy present.    She has no cervical adenopathy.  Neurological: She is alert.  Skin: Skin is warm and dry. No rash noted. She is not diaphoretic.  Psychiatric: She has a normal mood and affect.  Nursing note and vitals reviewed.   ED Course  Procedures (including critical care time) Labs Review Labs Reviewed  CBC WITH DIFFERENTIAL/PLATELET - Abnormal; Notable for the following:    WBC 10.8 (*)    All other components within normal limits  COMPREHENSIVE METABOLIC PANEL - Abnormal; Notable for the following:    Anion gap 4 (*)    All other components within normal limits  RAPID STREP SCREEN  CULTURE, GROUP A STREP  POC URINE PREG, ED    Imaging Review No results found.   EKG Interpretation None      MDM   Final diagnoses:  Strep pharyngitis   24 yo with tonsillar exudate, cervical lymphadenopathy, & dysphagia. Strep test is negative however diagnosis of strep made clinically. NS bolus, Toradol, Decadron, and Zofran and PCN IM. No indication of PTA or infxn spread to soft tissue. No trismus or uvula deviation. Pt tolerating POs without difficulty. Pain and other symptoms improved.  Pt is well-appearing, in no acute distress and vital signs reviewed and not concerning. She appears safe to be discharged.  Discharge include follow-up with their PCP.  Return precautions provided. Pt aware of plan and in agreement.    Filed Vitals:   07/07/14 1305 07/07/14 1400 07/07/14 1449 07/07/14 1500  BP: 116/87 96/56 115/62 107/64  Pulse:  80 84 80  Temp:      TempSrc:      Resp:   16   Height:      Weight:      SpO2:  97% 100% 97%   Meds given in ED:  Medications  sodium chloride 0.9 % bolus 1,000 mL (0 mLs Intravenous  Stopped 07/07/14 1518)  ondansetron (ZOFRAN) injection 4 mg (4 mg Intravenous Given 07/07/14 1436)  ketorolac (TORADOL) 30 MG/ML injection 30 mg (30 mg Intravenous Given 07/07/14 1433)  dexamethasone (DECADRON) injection 10 mg (10 mg Intravenous Given 07/07/14 1432)  penicillin g benzathine (BICILLIN LA) 1200000 UNIT/2ML injection 1.2 Million Units (1.2 Million Units Intramuscular Given 07/07/14 1438)    Discharge Medication List as of 07/07/2014  3:20 PM    START taking these medications   Details  ondansetron (ZOFRAN) 4 MG tablet Take 1 tablet (4 mg total) by mouth every 6 (six) hours., Starting 07/07/2014, Until Discontinued, Print  Harle Battiest, NP 07/07/14 1610  Toy Cookey, MD 07/08/14 [phone]

## 2014-07-07 NOTE — ED Notes (Signed)
Pt now c/o diarrhea and abd pain.

## 2014-07-07 NOTE — Discharge Instructions (Signed)
Please follow the directions provided. Be sure to follow-up with your primary care doctor to ensure you're getting better. You may take Tylenol or Advil for pain. Please take Zofran as needed for nausea. Continue to drink plenty of fluids by mouth to help stay hydrated. Don't hesitate to return for any new, worsening, or concerning symptoms.   SEEK IMMEDIATE MEDICAL CARE IF:  You develop any new symptoms such as vomiting, severe headache, stiff or painful neck, chest pain, shortness of breath, or trouble swallowing.  You develop severe throat pain, drooling, or changes in your voice.  You develop swelling of the neck, or the skin on the neck becomes red and tender.  You develop signs of dehydration, such as fatigue, dry mouth, and decreased urination.  You become increasingly sleepy, or you cannot wake up completely.

## 2014-07-09 LAB — CULTURE, GROUP A STREP

## 2014-07-13 ENCOUNTER — Emergency Department (HOSPITAL_COMMUNITY)
Admission: EM | Admit: 2014-07-13 | Discharge: 2014-07-13 | Disposition: A | Discharge status: Home / Self Care | Payer: Medicaid Other | Attending: Emergency Medicine | Admitting: Emergency Medicine

## 2014-07-13 ENCOUNTER — Encounter (HOSPITAL_COMMUNITY): Payer: Self-pay | Admitting: Emergency Medicine

## 2014-07-13 DIAGNOSIS — Z720 Tobacco use: Secondary | ICD-10-CM | POA: Insufficient documentation

## 2014-07-13 DIAGNOSIS — Z8659 Personal history of other mental and behavioral disorders: Secondary | ICD-10-CM | POA: Insufficient documentation

## 2014-07-13 DIAGNOSIS — Z793 Long term (current) use of hormonal contraceptives: Secondary | ICD-10-CM | POA: Insufficient documentation

## 2014-07-13 DIAGNOSIS — Z87440 Personal history of urinary (tract) infections: Secondary | ICD-10-CM | POA: Insufficient documentation

## 2014-07-13 DIAGNOSIS — Z91040 Latex allergy status: Secondary | ICD-10-CM | POA: Insufficient documentation

## 2014-07-13 DIAGNOSIS — Z792 Long term (current) use of antibiotics: Secondary | ICD-10-CM | POA: Insufficient documentation

## 2014-07-13 DIAGNOSIS — Y9389 Activity, other specified: Secondary | ICD-10-CM | POA: Diagnosis not present

## 2014-07-13 DIAGNOSIS — J45909 Unspecified asthma, uncomplicated: Secondary | ICD-10-CM | POA: Insufficient documentation

## 2014-07-13 DIAGNOSIS — Z79899 Other long term (current) drug therapy: Secondary | ICD-10-CM | POA: Diagnosis not present

## 2014-07-13 DIAGNOSIS — Z8619 Personal history of other infectious and parasitic diseases: Secondary | ICD-10-CM | POA: Diagnosis not present

## 2014-07-13 DIAGNOSIS — Y998 Other external cause status: Secondary | ICD-10-CM | POA: Diagnosis not present

## 2014-07-13 DIAGNOSIS — Y9289 Other specified places as the place of occurrence of the external cause: Secondary | ICD-10-CM | POA: Diagnosis not present

## 2014-07-13 DIAGNOSIS — S51812A Laceration without foreign body of left forearm, initial encounter: Secondary | ICD-10-CM | POA: Insufficient documentation

## 2014-07-13 MED ORDER — LIDOCAINE-EPINEPHRINE 2 %-1:100000 IJ SOLN
INTRAMUSCULAR | Status: AC
  Filled 2014-07-13: qty 1

## 2014-07-13 MED ORDER — LIDOCAINE-EPINEPHRINE 2 %-1:100000 IJ SOLN: 20.0000 mL | Freq: Once | INTRAMUSCULAR | Status: DC

## 2014-07-13 MED ORDER — HYDROCODONE-ACETAMINOPHEN 5-325 MG PO TABS: 1.0000 | ORAL_TABLET | Freq: Four times a day (QID) | ORAL | Status: DC | PRN
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
## 2014-07-13 NOTE — Discharge Instructions (Signed)
Please keep wound cleansed.  Have your sutures removed in 7 days.  Return if you notice signs of infection.    Laceration Care, Adult A laceration is a cut or lesion that goes through all layers of the skin and into the tissue just beneath the skin. TREATMENT  Some lacerations may not require closure. Some lacerations may not be able to be closed due to an increased risk of infection. It is important to see your caregiver as soon as possible after an injury to minimize the risk of infection and maximize the opportunity for successful closure. If closure is appropriate, pain medicines may be given, if needed. The wound will be cleaned to help prevent infection. Your caregiver will use stitches (sutures), staples, wound glue (adhesive), or skin adhesive strips to repair the laceration. These tools bring the skin edges together to allow for faster healing and a better cosmetic outcome. However, all wounds will heal with a scar. Once the wound has healed, scarring can be minimized by covering the wound with sunscreen during the day for 1 full year. HOME CARE INSTRUCTIONS  For sutures or staples:  Keep the wound clean and dry.  If you were given a bandage (dressing), you should change it at least once a day. Also, change the dressing if it becomes wet or dirty, or as directed by your caregiver.  Wash the wound with soap and water 2 times a day. Rinse the wound off with water to remove all soap. Pat the wound dry with a clean towel.  After cleaning, apply a thin layer of the antibiotic ointment as recommended by your caregiver. This will help prevent infection and keep the dressing from sticking.  You may shower as usual after the first 24 hours. Do not soak the wound in water until the sutures are removed.  Only take over-the-counter or prescription medicines for pain, discomfort, or fever as directed by your caregiver.  Get your sutures or staples removed as directed by your caregiver. For skin  adhesive strips:  Keep the wound clean and dry.  Do not get the skin adhesive strips wet. You may bathe carefully, using caution to keep the wound dry.  If the wound gets wet, pat it dry with a clean towel.  Skin adhesive strips will fall off on their own. You may trim the strips as the wound heals. Do not remove skin adhesive strips that are still stuck to the wound. They will fall off in time. For wound adhesive:  You may briefly wet your wound in the shower or bath. Do not soak or scrub the wound. Do not swim. Avoid periods of heavy perspiration until the skin adhesive has fallen off on its own. After showering or bathing, gently pat the wound dry with a clean towel.  Do not apply liquid medicine, cream medicine, or ointment medicine to your wound while the skin adhesive is in place. This may loosen the film before your wound is healed.  If a dressing is placed over the wound, be careful not to apply tape directly over the skin adhesive. This may cause the adhesive to be pulled off before the wound is healed.  Avoid prolonged exposure to sunlight or tanning lamps while the skin adhesive is in place. Exposure to ultraviolet light in the first year will darken the scar.  The skin adhesive will usually remain in place for 5 to 10 days, then naturally fall off the skin. Do not pick at the adhesive film. You may  need a tetanus shot if:  You cannot remember when you had your last tetanus shot.  You have never had a tetanus shot. If you get a tetanus shot, your arm may swell, get red, and feel warm to the touch. This is common and not a problem. If you need a tetanus shot and you choose not to have one, there is a rare chance of getting tetanus. Sickness from tetanus can be serious. SEEK MEDICAL CARE IF:   You have redness, swelling, or increasing pain in the wound.  You see a red line that goes away from the wound.  You have yellowish-white fluid (pus) coming from the wound.  You have  a fever.  You notice a bad smell coming from the wound or dressing.  Your wound breaks open before or after sutures have been removed.  You notice something coming out of the wound such as wood or glass.  Your wound is on your hand or foot and you cannot move a finger or toe. SEEK IMMEDIATE MEDICAL CARE IF:   Your pain is not controlled with prescribed medicine.  You have severe swelling around the wound causing pain and numbness or a change in color in your arm, hand, leg, or foot.  Your wound splits open and starts bleeding.  You have worsening numbness, weakness, or loss of function of any joint around or beyond the wound.  You develop painful lumps near the wound or on the skin anywhere on your body. MAKE SURE YOU:   Understand these instructions.  Will watch your condition.  Will get help right away if you are not doing well or get worse. Document Released: 04/15/2005 Document Revised: 07/08/2011 Document Reviewed: 10/09/2010 [facility] Patient Information 2015 [place], [place]. This information is not intended to replace advice given to you by your health care provider. Make sure you discuss any questions you have with your health care provider.

## 2014-07-23 ENCOUNTER — Emergency Department (INDEPENDENT_AMBULATORY_CARE_PROVIDER_SITE_OTHER)
Admission: EM | Admit: 2014-07-23 | Discharge: 2014-07-23 | Disposition: A | Discharge status: Home / Self Care | Payer: Medicaid Other | Source: Home / Self Care | Attending: Emergency Medicine | Admitting: Emergency Medicine

## 2014-07-23 ENCOUNTER — Encounter (HOSPITAL_COMMUNITY): Payer: Self-pay

## 2014-07-23 DIAGNOSIS — Z4802 Encounter for removal of sutures: Secondary | ICD-10-CM | POA: Diagnosis not present

## 2014-07-23 DIAGNOSIS — T798XXA Other early complications of trauma, initial encounter: Secondary | ICD-10-CM

## 2014-07-23 MED ORDER — HYDROCODONE-ACETAMINOPHEN 5-325 MG PO TABS: 1.0000 | ORAL_TABLET | Freq: Four times a day (QID) | ORAL | Status: DC | PRN

## 2014-07-23 MED ORDER — CEPHALEXIN 500 MG PO CAPS: 500.0000 mg | ORAL_CAPSULE | Freq: Four times a day (QID) | ORAL | Status: DC

## 2014-07-23 NOTE — ED Provider Notes (Signed)
CSN: [id number]     Arrival date & time 07/23/14  1625 History   First MD Initiated Contact with Patient 07/23/14 1634     Chief Complaint  Patient presents with  . Wound Check   (Consider location/radiation/quality/duration/timing/severity/associated sxs/prior Treatment) HPI She is a 24 year old woman here for suture removal. She sustained a laceration to her left forearm 10 days ago. She was seen in the ER at that time and 14 sutures were placed. In the last day one side of the cut has been red and very tender. No fevers.  Past Medical History  Diagnosis Date  . Asthma     childhood /grew out of it  . ADHD (attention deficit hyperactivity disorder)   . Urinary tract infection   . Trichomonas   . Fractured fibula     left  . Degenerative disk disease   . Abnormal Pap smear   . Chlamydia   . Depression     post partem  . History of PCOS   . Vaginal Pap smear, abnormal     normal since   Past Surgical History  Procedure Laterality Date  . Wisdom tooth extraction    . Induced abortion    . Dilation and curettage of uterus     Family History  Problem Relation Age of Onset  . Diabetes Mother   . Hypertension Mother   . Heart disease Mother   . Diabetes Maternal Grandfather   . Anesthesia problems Neg Hx   . Hypotension Neg Hx   . Malignant hyperthermia Neg Hx   . Pseudochol deficiency Neg Hx   . Heart disease Maternal Grandmother     chf   History  Substance Use Topics  . Smoking status: Current Every Day Smoker -- 0.50 packs/day for 7 years    Types: Cigarettes  . Smokeless tobacco: Never Used  . Alcohol Use: 0.0 oz/week     Comment: occ but was a heavy user   OB History    Gravida Para Term Preterm AB TAB SAB Ectopic Multiple Living   0 0 0 1     Review of Systems  Constitutional: Negative for fever.  Skin: Positive for wound.    Allergies  Other; Latex; Tramadol; and Tomato  Home Medications   Prior to Admission medications    Medication Sig Start Date End Date Taking? Authorizing Provider  BIOTIN PO Take 2 tablets by mouth daily.    Historical Provider, MD  cephALEXin (KEFLEX) 500 MG capsule Take 1 capsule (500 mg total) by mouth 4 (four) times daily. 07/23/14   Charm Rings, MD  diphenhydrAMINE (SOMINEX) 25 MG tablet Take 50 mg by mouth at bedtime as needed for allergies or sleep.    Historical Provider, MD  HYDROcodone-acetaminophen (NORCO/VICODIN) 5-325 MG per tablet Take 1 tablet by mouth every 6 (six) hours as needed for moderate pain. 07/23/14   Charm Rings, MD  Levonorgestrel-Ethinyl Estradiol (SEASONIQUE) 0.15-0.03 &0.01 MG tablet Take 1 tablet by mouth daily.    Historical Provider, MD  metroNIDAZOLE (FLAGYL) 500 MG tablet Take 1 tablet (500 mg total) by mouth 2 (two) times daily. Patient not taking: Reported on 07/13/2014 06/17/14   Scot Jun Teague Clark, PA-C  ondansetron (ZOFRAN) 4 MG tablet Take 1 tablet (4 mg total) by mouth every 6 (six) hours. Patient not taking: Reported on 07/13/2014 07/07/14   Harle Battiest, NP   BP 147/73 mmHg  Pulse 70  Temp(Src) 99.2 F (37.3  C) (Oral)  Resp 12  SpO2 98% Physical Exam  Constitutional: She is oriented to [place], place, and time. She appears well-developed and well-nourished. No distress.  Cardiovascular: Normal rate.   Pulmonary/Chest: Effort normal.  Neurological: She is alert and oriented to [place], place, and time.  Skin:  7 cm laceration to left forearm.  About half of it is well-healed. The other half has surrounding erythema and swelling.    ED Course  SUTURE REMOVAL Date/Time: 07/23/2014 4:59 PM Performed by: Charm RingsHONIG, Wilda Wetherell J Authorized by: Charm RingsHONIG, Emanie Behan J Consent: Verbal consent obtained. Risks and benefits: risks, benefits and alternatives were discussed Consent given by: patient Patient understanding: patient states understanding of the procedure being performed Patient identity confirmed: verbally with patient Time out: Immediately prior to  procedure a "time out" was called to verify the correct patient, procedure, equipment, support staff and site/side marked as required. Body area: upper extremity Location details: left lower arm Objective wound description: Half of the wound is clean and well-healed. The other half is red and swollen. Sutures Removed: 6 Post-removal: antibiotic ointment applied and dressing applied Patient tolerance: Patient tolerated the procedure well with no immediate complications   (including critical care time) Labs Review Labs Reviewed - No data to display  Imaging Review No results found.   MDM   1. Wound infection, initial encounter   2. Visit for suture removal    I removed 6 of 14 stitches. Will start Keflex to treat wound infection. Follow-up in 3 days for recheck and suture removal.    Charm RingsErin J Masaru Chamberlin, MD 07/23/14 1701

## 2014-07-23 NOTE — Discharge Instructions (Signed)
I took some of the stitches out today. I am worried about an infection in part of the cut. Take Keflex 4 times a day for the next week. Come back in 3 days to get the rest of the stitches out and to recheck the wound.

## 2014-07-23 NOTE — ED Notes (Signed)
Here for wound check, suture removal. Concerned about redness, swelling, poss. wound infection

## 2014-08-25 ENCOUNTER — Encounter (HOSPITAL_COMMUNITY): Payer: Self-pay

## 2014-08-25 ENCOUNTER — Inpatient Hospital Stay (HOSPITAL_COMMUNITY)
Admission: AD | Admit: 2014-08-25 | Discharge: 2014-08-25 | Disposition: A | Discharge status: Home / Self Care | Payer: Medicaid Other | Source: Ambulatory Visit | Attending: Obstetrics & Gynecology | Admitting: Obstetrics & Gynecology

## 2014-08-25 DIAGNOSIS — R102 Pelvic and perineal pain: Secondary | ICD-10-CM | POA: Diagnosis not present

## 2014-08-25 DIAGNOSIS — R109 Unspecified abdominal pain: Secondary | ICD-10-CM | POA: Insufficient documentation

## 2014-08-25 DIAGNOSIS — F17210 Nicotine dependence, cigarettes, uncomplicated: Secondary | ICD-10-CM | POA: Insufficient documentation

## 2014-08-25 DIAGNOSIS — N921 Excessive and frequent menstruation with irregular cycle: Secondary | ICD-10-CM

## 2014-08-25 DIAGNOSIS — Z202 Contact with and (suspected) exposure to infections with a predominantly sexual mode of transmission: Secondary | ICD-10-CM | POA: Insufficient documentation

## 2014-08-25 LAB — WET PREP, GENITAL
Trich, Wet Prep: NONE SEEN
Yeast Wet Prep HPF POC: NONE SEEN

## 2014-08-25 LAB — CBC
HCT: 37.7 % (ref 36.0–46.0)
Hemoglobin: 12.4 g/dL (ref 12.0–15.0)
MCH: 28.2 pg (ref 26.0–34.0)
MCHC: 32.9 g/dL (ref 30.0–36.0)
MCV: 85.7 fL (ref 78.0–100.0)
PLATELETS: 300 10*3/uL (ref 150–400)
RBC: 4.4 MIL/uL (ref 3.87–5.11)
RDW: 14.9 % (ref 11.5–15.5)
WBC: 6.7 10*3/uL (ref 4.0–10.5)

## 2014-08-25 LAB — URINALYSIS, ROUTINE W REFLEX MICROSCOPIC
Bilirubin Urine: NEGATIVE
Glucose, UA: NEGATIVE mg/dL
Hgb urine dipstick: NEGATIVE
Ketones, ur: NEGATIVE mg/dL
Nitrite: NEGATIVE
PROTEIN: NEGATIVE mg/dL
Specific Gravity, Urine: >1.03 — ABNORMAL HIGH (ref 1.005–1.030)
Urobilinogen, UA: 0.2 mg/dL (ref 0.0–1.0)
pH: 6 (ref 5.0–8.0)

## 2014-08-25 LAB — POCT PREGNANCY, URINE: PREG TEST UR: NEGATIVE

## 2014-08-25 LAB — URINE MICROSCOPIC-ADD ON

## 2014-08-25 MED ORDER — AZITHROMYCIN 250 MG PO TABS
1000.0000 mg | ORAL_TABLET | Freq: Once | ORAL | Status: AC
  Administered 2014-08-25: 1000 mg via ORAL
  Filled 2014-08-25: qty 4

## 2014-08-25 MED ORDER — METRONIDAZOLE 500 MG PO TABS
2000.0000 mg | ORAL_TABLET | Freq: Once | ORAL | Status: AC
  Administered 2014-08-25: 2000 mg via ORAL
  Filled 2014-08-25: qty 4

## 2014-08-25 MED ORDER — NORGESTIMATE-ETH ESTRADIOL 0.25-35 MG-MCG PO TABS: 1.0000 | ORAL_TABLET | Freq: Every day | ORAL | Status: DC

## 2014-08-25 MED ORDER — CEFTRIAXONE SODIUM 250 MG IJ SOLR
250.0000 mg | Freq: Once | INTRAMUSCULAR | Status: AC
  Administered 2014-08-25: 250 mg via INTRAMUSCULAR
  Filled 2014-08-25: qty 250

## 2014-08-25 NOTE — Progress Notes (Signed)
Patient walked out of [facility] satellite without saying anything with another patient.

## 2014-08-25 NOTE — MAU Provider Note (Signed)
Chief Complaint: Abdominal Pain   First Provider Initiated Contact with Patient 08/25/14 1914      SUBJECTIVE HPI: Teresa [name] is a 24 y.o. [id number] who presents to maternity admissions reporting abdominal cramping pain x 2 days, concerns about exposure to STD, and light menses 2 weeks ago with concerns of pregnancy.  She reports her abdominal pain is intermittent, cramping, not affected by movement.  She has not taken anything for pain.  She reports her boyfriend was unfaithful a few months ago and she was positive for chlamydia.  He did not complete his treatment so she became reinfected and was treated 2 more times.  She also reports an episode 2 weeks ago of vaginal inflammation and pain that lasted more than 10 days. She reports she did not see any lesions when she looked but the area looked red.  These symptoms are now gone.  She desires STD testing and treatment for anything we can give medications for today.  She denies vaginal bleeding, vaginal itching/burning, urinary symptoms, h/a, dizziness, n/v, or fever/chills.     Abdominal Pain This is a new problem. The current episode started yesterday. The onset quality is sudden. The problem occurs intermittently. The most recent episode lasted 1 day. The problem has been waxing and waning. The pain is located in the RLQ and LLQ. The quality of the pain is cramping and sharp. The abdominal pain does not radiate. Pertinent negatives include no constipation, diarrhea, dysuria, fever, frequency, headaches, nausea or vomiting. She has tried nothing for the symptoms.    Past Medical History  Diagnosis Date  . Asthma     childhood /grew out of it  . ADHD (attention deficit hyperactivity disorder)   . Urinary tract infection   . Trichomonas   . Fractured fibula     left  . Degenerative disk disease   . Abnormal Pap smear   . Chlamydia   . Depression     post partem  . History of PCOS   . Vaginal Pap smear, abnormal     normal since    Past Surgical History  Procedure Laterality Date  . Wisdom tooth extraction    . Induced abortion    . Dilation and curettage of uterus     History   Social History  . Marital Status: Single    Spouse Name: N/A  . Number of Children: N/A  . Years of Education: N/A   Occupational History  . Not on file.   Social History Main Topics  . Smoking status: Current Every Day Smoker -- 0.50 packs/day for 7 years    Types: Cigarettes  . Smokeless tobacco: Never Used  . Alcohol Use: 0.0 oz/week     Comment: occ but was a heavy user  . Drug Use: Yes    Special: Marijuana     Comment: Last use today, last use 1 week ago  . Sexual Activity: Yes    Birth Control/ Protection: None     Comment: last intercourse 3 weeks ago   Other Topics Concern  . Not on file   Social History Narrative   No current facility-administered medications on file prior to encounter.   Current Outpatient Prescriptions on File Prior to Encounter  Medication Sig Dispense Refill  . BIOTIN PO Take 2 tablets by mouth daily.    . diphenhydrAMINE (SOMINEX) 25 MG tablet Take 50 mg by mouth at bedtime as needed for allergies or sleep.    . cephALEXin (KEFLEX) 500  MG capsule Take 1 capsule (500 mg total) by mouth 4 (four) times daily. (Patient not taking: Reported on 08/25/2014) 28 capsule 0  . HYDROcodone-acetaminophen (NORCO/VICODIN) 5-325 MG per tablet Take 1 tablet by mouth every 6 (six) hours as needed for moderate pain. (Patient not taking: Reported on 08/25/2014) 12 tablet 0  . Levonorgestrel-Ethinyl Estradiol (SEASONIQUE) 0.15-0.03 &0.01 MG tablet Take 1 tablet by mouth daily.    . metroNIDAZOLE (FLAGYL) 500 MG tablet Take 1 tablet (500 mg total) by mouth 2 (two) times daily. (Patient not taking: Reported on 07/13/2014) 14 tablet 0  . ondansetron (ZOFRAN) 4 MG tablet Take 1 tablet (4 mg total) by mouth every 6 (six) hours. (Patient not taking: Reported on 07/13/2014) 12 tablet 0   Allergies  Allergen Reactions   . Other Anaphylaxis    Grapes, strawberries, bananas, pineapple, oranges, nuts  . Betadine [Povidone Iodine] Itching    Betadine causes itching and skin redness and irritation  . Latex Itching  . Tramadol Nausea And Vomiting  . Tomato Itching and Rash    Review of Systems  Constitutional: Negative for fever and malaise/fatigue.  Respiratory: Negative for shortness of breath.   Gastrointestinal: Positive for abdominal pain. Negative for nausea, vomiting, diarrhea and constipation.  Genitourinary: Negative for dysuria and frequency.  Neurological: Negative for dizziness and headaches.    OBJECTIVE Blood pressure 148/82, pulse 71, temperature 99 F (37.2 C), temperature source Oral, resp. rate 18, height  (1.549 m), weight 126.61 kg (279 lb 2 oz), last menstrual period 08/04/2014, SpO2 100 %. GENERAL: Well-developed, well-nourished female in no acute distress.  EYES: normal sclera/conjunctiva; no lid-lag HENT: Atraumatic, normocephalic HEART: normal rate RESP: normal effort ABDOMEN: Soft, non-tender MUSCULOSKELETAL: Normal ROM EXTREMITIES: Nontender, no edema NEURO/PSYCH: Alert and oriented, appropriate affect  PELVIC EXAM: Cervix pink, visually closed, without lesion, scant white creamy discharge, vaginal walls and external genitalia normal Bimanual exam: Cervix 0/long/high, firm, anterior, neg CMT, uterus nontender, nonenlarged, adnexa without tenderness, enlargement, or mass   LAB RESULTS Results for orders placed or performed during the hospital encounter of 08/25/14 (from the past 24 hour(s))  Urinalysis, Routine w reflex microscopic     Status: Abnormal   Collection Time: 08/25/14  4:08 PM  Result Value Ref Range   Color, Urine YELLOW YELLOW   APPearance CLEAR CLEAR   Specific Gravity, Urine >1.030 (H) 1.005 - 1.030   pH 6.0 5.0 - 8.0   Glucose, UA NEGATIVE NEGATIVE mg/dL   Hgb urine dipstick NEGATIVE NEGATIVE   Bilirubin Urine NEGATIVE NEGATIVE   Ketones, ur  NEGATIVE NEGATIVE mg/dL   Protein, ur NEGATIVE NEGATIVE mg/dL   Urobilinogen, UA 0.2 0.0 - 1.0 mg/dL   Nitrite NEGATIVE NEGATIVE   Leukocytes, UA TRACE (A) NEGATIVE  Urine microscopic-add on     Status: Abnormal   Collection Time: 08/25/14  4:08 PM  Result Value Ref Range   Squamous Epithelial / LPF MANY (A) RARE   WBC, UA 3-6 <3 WBC/hpf   Bacteria, UA RARE RARE   Urine-Other MUCOUS PRESENT   Pregnancy, urine POC     Status: None   Collection Time: 08/25/14  4:22 PM  Result Value Ref Range   Preg Test, Ur NEGATIVE NEGATIVE  CBC     Status: None   Collection Time: 08/25/14  5:05 PM  Result Value Ref Range   WBC 6.7 4.0 - 10.5 K/uL   RBC 4.40 3.87 - 5.11 MIL/uL   Hemoglobin 12.4 12.0 - 15.0 g/dL  HCT 37.7 36.0 - 46.0 %   MCV 85.7 78.0 - 100.0 fL   MCH 28.2 26.0 - 34.0 pg   MCHC 32.9 30.0 - 36.0 g/dL   RDW 16.1 09.6 - 04.5 %   Platelets 300 150 - 400 K/uL  Wet prep, genital     Status: Abnormal   Collection Time: 08/25/14  7:15 PM  Result Value Ref Range   Yeast Wet Prep HPF POC NONE SEEN NONE SEEN   Trich, Wet Prep NONE SEEN NONE SEEN   Clue Cells Wet Prep HPF POC FEW (A) NONE SEEN   WBC, Wet Prep HPF POC FEW (A) NONE SEEN    MAU MDM  Pt given treatment for exposure to STD including Rocephin 250 mg IM, azithromycin 1000 mg PO, Flagyl 2000 mg PO.   Labs done in MAU include CBC, wet prep, u/a, HIV, RPR, and HSV 1 and 2. Reviewed results for CBC, wet prep, and u/a with pt.   Pt very anxious about possibility of STDs. Discussed risks to STD, recommend condom use.   ASSESSMENT 1. Exposure to STD   2. Pelvic pain in female     PLAN Discharge home Changed OCPs to Sprintec 28 with 11 refills Discussion of safe sex practices with pt, decreasing risks for STDs   Follow-up Information    Please follow up.   Why:  Call [phone] on Monday for results      Follow up with [facility]ERN[facility] AD[facility].   Why:  As needed for emergencies    Contact information:   [address] [id number] mc [place] [address] [phone]      Sharen Counter Certified Nurse-Midwife 08/25/2014  8:58 PM

## 2014-08-25 NOTE — Discharge Instructions (Signed)
Sexually Transmitted Disease °A sexually transmitted disease (STD) is a disease or infection that may be passed (transmitted) from Teresa [name] to [name], usually during sexual activity. This may happen by way of saliva, semen, blood, vaginal mucus, or urine. Common STDs include:  °· Gonorrhea.   °· Chlamydia.   °· Syphilis.   °· HIV and AIDS.   °· Genital herpes.   °· Hepatitis B and C.   °· Trichomonas.   °· Human papillomavirus (HPV).   °· Pubic lice.   °· Scabies. °· Mites. °· Bacterial vaginosis. °WHAT ARE CAUSES OF STDs? °An STD may be caused by bacteria, a virus, or parasites. STDs are often transmitted during sexual activity if one Teresa [name] is infected. However, they may also be transmitted through nonsexual means. STDs may be transmitted after:  °· Sexual intercourse with an infected Power.   °· Sharing sex toys with an infected [name].   °· Sharing needles with an infected Logie or using unclean piercing or tattoo needles. °· Having intimate contact with the genitals, mouth, or rectal areas of an infected [name].   °· Exposure to infected fluids during birth. °WHAT ARE THE SIGNS AND SYMPTOMS OF STDs? °Different STDs have different symptoms. Some people may not have any symptoms. If symptoms are present, they may include:  °· Painful or bloody urination.   °· Pain in the pelvis, abdomen, vagina, anus, throat, or eyes.   °· A skin rash, itching, or irritation. °· Growths, ulcerations, blisters, or sores in the genital and anal areas. °· Abnormal vaginal discharge with or without bad odor.   °· Penile discharge in men.   °· Fever.   °· Pain or bleeding during sexual intercourse.   °· Swollen glands in the groin area.   °· Yellow skin and eyes (jaundice). This is seen with hepatitis.   °· Swollen testicles. °· Infertility. °· Sores and blisters in the mouth. °HOW ARE STDs DIAGNOSED? °To make a diagnosis, your health care provider may:  °· Take a medical history.   °· Perform a physical exam.   °· Take a sample of  any discharge to examine. °· Swab the throat, cervix, opening to the penis, rectum, or vagina for testing. °· Test a sample of your first morning urine.   °· Perform blood tests.   °· Perform a Pap test, if this applies.   °· Perform a colposcopy.   °· Perform a laparoscopy.   °HOW ARE STDs TREATED? ° Treatment depends on the STD. Some STDs may be treated but not cured.  °· Chlamydia, gonorrhea, trichomonas, and syphilis can be cured with antibiotic medicine.   °· Genital herpes, hepatitis, and HIV can be treated, but not cured, with prescribed medicines. The medicines lessen symptoms.   °· Genital warts from HPV can be treated with medicine or by freezing, burning (electrocautery), or surgery. Warts may come back.   °· HPV cannot be cured with medicine or surgery. However, abnormal areas may be removed from the cervix, vagina, or vulva.   °· If your diagnosis is confirmed, your recent sexual partners need treatment. This is true even if they are symptom-free or have a negative culture or evaluation. They should not have sex until their health care providers say it is okay. °HOW CAN I REDUCE MY RISK OF GETTING AN STD? °Take these steps to reduce your risk of getting an STD: °· Use latex condoms, dental dams, and water-soluble lubricants during sexual activity. Do not use petroleum jelly or oils. °· Avoid having multiple sex partners. °· Do not have sex with someone who has other sex partners. °· Do not have sex with anyone you do not know or who is at   high risk for an STD.  Avoid risky sex practices that can break your skin.  Do not have sex if you have open sores on your mouth or skin.  Avoid drinking too much alcohol or taking illegal drugs. Alcohol and drugs can affect your judgment and put you in a vulnerable position.  Avoid engaging in oral and anal sex acts.  Get vaccinated for HPV and hepatitis. If you have not received these vaccines in the past, talk to your health care provider about whether one  or both might be right for you.   If you are at risk of being infected with HIV, it is recommended that you take a prescription medicine daily to prevent HIV infection. This is called pre-exposure prophylaxis (PrEP). You are considered at risk if:  You are a man who has sex with other men (MSM).  You are a heterosexual man or woman and are sexually active with more than one partner.  You take drugs by injection.  You are sexually active with a partner who has HIV.  Talk with your health care provider about whether you are at high risk of being infected with HIV. If you choose to begin PrEP, you should first be tested for HIV. You should then be tested every 3 months for as long as you are taking PrEP.  WHAT SHOULD I DO IF I THINK I HAVE AN STD?  See your health care provider.   Tell your sexual partner(s). They should be tested and treated for any STDs.  Do not have sex until your health care provider says it is okay. WHEN SHOULD I GET IMMEDIATE MEDICAL CARE? Contact your health care provider right away if:   You have severe abdominal pain.  You are a man and notice swelling or pain in your testicles.  You are a woman and notice swelling or pain in your vagina. Document Released: 07/06/2002 Document Revised: 04/20/2013 Document Reviewed: 11/03/2012 [facility] Patient Information 2015 [place], [place]. This information is not intended to replace advice given to you by your health care provider. Make sure you discuss any questions you have with your health care provider. Pelvic Pain Female pelvic pain can be caused by many different things and start from a variety of places. Pelvic pain refers to pain that is located in the lower half of the abdomen and between your hips. The pain may occur over a short period of time (acute) or may be reoccurring (chronic). The cause of pelvic pain may be related to disorders affecting the female reproductive organs (gynecologic), but it may also be  related to the bladder, kidney stones, an intestinal complication, or muscle or skeletal problems. Getting help right away for pelvic pain is important, especially if there has been severe, sharp, or a sudden onset of unusual pain. It is also important to get help right away because some types of pelvic pain can be life threatening.  CAUSES  Below are only some of the causes of pelvic pain. The causes of pelvic pain can be in one of several categories.   Gynecologic.  Pelvic inflammatory disease.  Sexually transmitted infection.  Ovarian cyst or a twisted ovarian ligament (ovarian torsion).  Uterine lining that grows outside the uterus (endometriosis).  Fibroids, cysts, or tumors.  Ovulation.  Pregnancy.  Pregnancy that occurs outside the uterus (ectopic pregnancy).  Miscarriage.  Labor.  Abruption of the placenta or ruptured uterus.  Infection.  Uterine infection (endometritis).  Bladder infection.  Diverticulitis.  Miscarriage related to  a uterine infection (septic abortion).  Bladder.  Inflammation of the bladder (cystitis).  Kidney stone(s).  Gastrointestinal.  Constipation.  Diverticulitis.  Neurologic.  Trauma.  Feeling pelvic pain because of mental or emotional causes (psychosomatic).  Cancers of the bowel or pelvis. EVALUATION  Your caregiver will want to take a careful history of your concerns. This includes recent changes in your health, a careful gynecologic history of your periods (menses), and a sexual history. Obtaining your family history and medical history is also important. Your caregiver may suggest a pelvic exam. A pelvic exam will help identify the location and severity of the pain. It also helps in the evaluation of which organ system may be involved. In order to identify the cause of the pelvic pain and be properly treated, your caregiver may order tests. These tests may include:   A pregnancy test.  Pelvic ultrasonography.  An  X-ray exam of the abdomen.  A urinalysis or evaluation of vaginal discharge.  Blood tests. HOME CARE INSTRUCTIONS   Only take over-the-counter or prescription medicines for pain, discomfort, or fever as directed by your caregiver.   Rest as directed by your caregiver.   Eat a balanced diet.   Drink enough fluids to make your urine clear or pale yellow, or as directed.   Avoid sexual intercourse if it causes pain.   Apply warm or cold compresses to the lower abdomen depending on which one helps the pain.   Avoid stressful situations.   Keep a journal of your pelvic pain. Write down when it started, where the pain is located, and if there are things that seem to be associated with the pain, such as food or your menstrual cycle.  Follow up with your caregiver as directed.  SEEK MEDICAL CARE IF:  Your medicine does not help your pain.  You have abnormal vaginal discharge. SEEK IMMEDIATE MEDICAL CARE IF:   You have heavy bleeding from the vagina.   Your pelvic pain increases.   You feel light-headed or faint.   You have chills.   You have pain with urination or blood in your urine.   You have uncontrolled diarrhea or vomiting.   You have a fever or persistent symptoms for more than 3 days.  You have a fever and your symptoms suddenly get worse.   You are being physically or sexually abused.  MAKE SURE YOU:  Understand these instructions.  Will watch your condition.  Will get help if you are not doing well or get worse. Document Released: 03/12/2004 Document Revised: 08/30/2013 Document Reviewed: 08/05/2011 [facility] Patient Information 2015 [place], [place]. This information is not intended to replace advice given to you by your health care provider. Make sure you discuss any questions you have with your health care provider.

## 2014-08-25 NOTE — MAU Note (Signed)
Pt reports she has been having abd pain today and feeling nauseated and vomiting since yesterday.

## 2014-08-26 LAB — GC/CHLAMYDIA PROBE AMP (~~LOC~~) NOT AT ARMC
Chlamydia: NEGATIVE
Neisseria Gonorrhea: NEGATIVE

## 2014-08-26 LAB — HSV 2 ANTIBODY, IGG: HSV 2 Glycoprotein G Ab, IgG: <0.91 index (ref 0.00–0.90)

## 2014-08-26 LAB — HSV 1 ANTIBODY, IGG: HSV 1 Glycoprotein G Ab, IgG: 46.9 index — ABNORMAL HIGH (ref 0.00–0.90)

## 2014-08-26 LAB — RPR: RPR: NONREACTIVE

## 2014-08-26 LAB — HIV ANTIBODY (ROUTINE TESTING W REFLEX): HIV Screen 4th Generation wRfx: NONREACTIVE

## 2014-11-09 ENCOUNTER — Emergency Department (HOSPITAL_COMMUNITY)
Admission: EM | Admit: 2014-11-09 | Discharge: 2014-11-09 | Disposition: A | Discharge status: Home / Self Care | Payer: Medicaid Other | Attending: Emergency Medicine | Admitting: Emergency Medicine

## 2014-11-09 ENCOUNTER — Encounter (HOSPITAL_COMMUNITY): Payer: Self-pay

## 2014-11-09 ENCOUNTER — Emergency Department (HOSPITAL_COMMUNITY): Payer: Medicaid Other

## 2014-11-09 DIAGNOSIS — Z8619 Personal history of other infectious and parasitic diseases: Secondary | ICD-10-CM | POA: Diagnosis not present

## 2014-11-09 DIAGNOSIS — Z91040 Latex allergy status: Secondary | ICD-10-CM | POA: Diagnosis not present

## 2014-11-09 DIAGNOSIS — Z79818 Long term (current) use of other agents affecting estrogen receptors and estrogen levels: Secondary | ICD-10-CM | POA: Diagnosis not present

## 2014-11-09 DIAGNOSIS — J45909 Unspecified asthma, uncomplicated: Secondary | ICD-10-CM | POA: Insufficient documentation

## 2014-11-09 DIAGNOSIS — Z8781 Personal history of (healed) traumatic fracture: Secondary | ICD-10-CM | POA: Insufficient documentation

## 2014-11-09 DIAGNOSIS — Z87440 Personal history of urinary (tract) infections: Secondary | ICD-10-CM | POA: Diagnosis not present

## 2014-11-09 DIAGNOSIS — R110 Nausea: Secondary | ICD-10-CM | POA: Diagnosis not present

## 2014-11-09 DIAGNOSIS — R51 Headache: Secondary | ICD-10-CM | POA: Insufficient documentation

## 2014-11-09 DIAGNOSIS — Z8659 Personal history of other mental and behavioral disorders: Secondary | ICD-10-CM | POA: Insufficient documentation

## 2014-11-09 DIAGNOSIS — H53149 Visual discomfort, unspecified: Secondary | ICD-10-CM | POA: Diagnosis not present

## 2014-11-09 DIAGNOSIS — E669 Obesity, unspecified: Secondary | ICD-10-CM | POA: Diagnosis not present

## 2014-11-09 DIAGNOSIS — R519 Headache, unspecified: Secondary | ICD-10-CM

## 2014-11-09 DIAGNOSIS — Z720 Tobacco use: Secondary | ICD-10-CM | POA: Diagnosis not present

## 2014-11-09 MED ORDER — METOCLOPRAMIDE HCL 5 MG/ML IJ SOLN
10.0000 mg | Freq: Once | INTRAMUSCULAR | Status: AC
  Administered 2014-11-09: 10 mg via INTRAVENOUS
  Filled 2014-11-09: qty 2

## 2014-11-09 MED ORDER — KETOROLAC TROMETHAMINE 30 MG/ML IJ SOLN
30.0000 mg | Freq: Once | INTRAMUSCULAR | Status: AC
Start: 2014-11-09 — End: 2014-11-09
  Administered 2014-11-09: 30 mg via INTRAVENOUS
  Filled 2014-11-09: qty 1

## 2014-11-09 MED ORDER — SODIUM CHLORIDE 0.9 % IV BOLUS (SEPSIS)
1000.0000 mL | Freq: Once | INTRAVENOUS | Status: AC
  Administered 2014-11-09: 1000 mL via INTRAVENOUS

## 2014-11-09 MED ORDER — BUTALBITAL-APAP-CAFFEINE 50-325-40 MG PO TABS: 1.0000 | ORAL_TABLET | Freq: Four times a day (QID) | ORAL | Status: DC | PRN

## 2014-11-09 MED ORDER — DIPHENHYDRAMINE HCL 50 MG/ML IJ SOLN
25.0000 mg | Freq: Once | INTRAMUSCULAR | Status: AC
  Administered 2014-11-09: 25 mg via INTRAVENOUS
  Filled 2014-11-09: qty 1

## 2014-11-09 NOTE — ED Notes (Signed)
Pt has hx of migraines and has had such for months.  Normally wakes up daily with same.  Today pain is not going away.  Light sensitivity.

## 2014-11-09 NOTE — ED Provider Notes (Signed)
CSN: [id number]     Arrival date & time 11/09/14  1048 History   First MD Initiated Contact with Patient 11/09/14 1310     Chief Complaint  Patient presents with  . Migraine     (Consider location/radiation/quality/duration/timing/severity/associated sxs/prior Treatment) HPI  24 year old female presents with a daily headache since last month. Patient states the headache is worse when she first wakes up and she takes ibuprofen it seems to improve. She has been nauseated but no vomiting. The patient states this morning the headache was worse than other days and ibuprofen did not help. She has been photophobic but denies any blurry vision. She denies a weakness or numbness. No fevers or neck stiffness. States when she was a teenager she had migraines but otherwise has not had chronic headaches. This all started when she was traveling in [place], where she stayed for a couple weeks. She states she only stayed in the city and did not get exposed to or find any ticks. She has not had any rashes.  Past Medical History  Diagnosis Date  . Asthma     childhood /grew out of it  . ADHD (attention deficit hyperactivity disorder)   . Urinary tract infection   . Trichomonas   . Fractured fibula     left  . Degenerative disk disease   . Abnormal Pap smear   . Chlamydia   . Depression     post partem  . History of PCOS   . Vaginal Pap smear, abnormal     normal since   Past Surgical History  Procedure Laterality Date  . Wisdom tooth extraction    . Induced abortion    . Dilation and curettage of uterus     Family History  Problem Relation Age of Onset  . Diabetes Mother   . Hypertension Mother   . Heart disease Mother   . Diabetes Maternal Grandfather   . Anesthesia problems Neg Hx   . Hypotension Neg Hx   . Malignant hyperthermia Neg Hx   . Pseudochol deficiency Neg Hx   . Heart disease Maternal Grandmother     chf   History  Substance Use Topics  . Smoking status: Current Every  Day Smoker -- 0.50 packs/day for 7 years    Types: Cigarettes  . Smokeless tobacco: Never Used  . Alcohol Use: 0.0 oz/week     Comment: occ but was a heavy user   OB History    Gravida Para Term Preterm AB TAB SAB Ectopic Multiple Living   0 0 0 1     Review of Systems  Constitutional: Negative for fever.  Eyes: Positive for photophobia. Negative for visual disturbance.  Gastrointestinal: Positive for nausea. Negative for vomiting.  Genitourinary: Negative for menstrual problem (currently on her period).  Musculoskeletal: Negative for neck pain and neck stiffness.  Neurological: Positive for headaches. Negative for weakness and numbness.  All other systems reviewed and are negative.     Allergies  Other; Betadine; Latex; Tramadol; and Tomato  Home Medications   Prior to Admission medications   Medication Sig Start Date End Date Taking? Authorizing Provider  ibuprofen (ADVIL,MOTRIN) 200 MG tablet Take 400 mg by mouth every 6 (six) hours as needed for headache, mild pain or moderate pain.   Yes Historical Provider, MD  norgestimate-ethinyl estradiol (ORTHO-CYCLEN,SPRINTEC,PREVIFEM) 0.25-35 MG-MCG tablet Take 1 tablet by mouth daily. Patient not taking: Reported on 11/09/2014 08/25/14   Hurshel Party, CNM  BP 127/76 mmHg  Pulse 76  Temp(Src) 98.4 F (36.9 C) (Oral)  Resp 20  SpO2 100%  LMP 11/09/2014 Physical Exam  Constitutional: She is oriented to [place], place, and time. She appears well-developed and well-nourished.  obese  HENT:  Head: Normocephalic and atraumatic.  Right Ear: External ear normal.  Left Ear: External ear normal.  Nose: Nose normal.  Eyes: EOM are normal. Pupils are equal, round, and reactive to light. Right eye exhibits no discharge. Left eye exhibits no discharge.  photophobic  Neck: Normal range of motion. Neck supple.  No meningismus  Cardiovascular: Normal rate, regular rhythm and normal heart sounds.   Pulmonary/Chest:  Effort normal and breath sounds normal.  Abdominal: Soft. There is no tenderness.  Neurological: She is alert and oriented to [place], place, and time.  CN 2-12 grossly intact. 5/5 strength in all 4 extremities  Skin: Skin is warm and dry.  Nursing note and vitals reviewed.   ED Course  Procedures (including critical care time) Labs Review Labs Reviewed - No data to display  Imaging Review Ct Head Wo Contrast  11/09/2014   CLINICAL DATA:  Left periorbital and frontal headaches ; blurred vision. Headaches have occurred daily for past month  EXAM: CT HEAD WITHOUT CONTRAST  TECHNIQUE: Contiguous axial images were obtained from the base of the skull through the vertex without intravenous contrast.  COMPARISON:  None.  FINDINGS: The ventricles are normal in size and configuration. There is no intracranial mass, hemorrhage, extra-axial fluid collection, or midline shift. Gray-white compartments are normal. No acute infarct apparent. The bony calvarium appears intact. The mastoid air cells are clear. There is opacification in the posterior aspect of the right sphenoid sinus. Visualized orbits appear symmetric and within normal limits.  IMPRESSION: Right sphenoid sinus disease. No intracranial mass, hemorrhage, or focal gray -white compartment lesions/acute appearing infarct.   Electronically Signed   By: Bretta BangWilliam  Woodruff III M.D.   On: 11/09/2014 15:13     EKG Interpretation None      MDM   Final diagnoses:  Daily headache    Given that her headaches have been occurring daily for over 1 month a CT was obtained and is negative. I have low [facility], tumor, meningitis/encephalitis or CVA. Neuro exam normal. HA resolved with treatment here. Will recommend f/u with her PCP, and give referral to neuro.[place] and thus I think lyme disease is unlikely.     Pricilla LovelessScott Ryheem Jay, MD 11/09/14 1726

## 2014-11-25 ENCOUNTER — Encounter (HOSPITAL_COMMUNITY): Payer: Self-pay

## 2014-11-25 ENCOUNTER — Inpatient Hospital Stay (HOSPITAL_COMMUNITY)
Admission: AD | Admit: 2014-11-25 | Discharge: 2014-11-25 | Disposition: A | Discharge status: Home / Self Care | Payer: Medicaid Other | Source: Ambulatory Visit | Attending: Family Medicine | Admitting: Family Medicine

## 2014-11-25 DIAGNOSIS — L298 Other pruritus: Secondary | ICD-10-CM | POA: Diagnosis not present

## 2014-11-25 DIAGNOSIS — D500 Iron deficiency anemia secondary to blood loss (chronic): Secondary | ICD-10-CM | POA: Diagnosis not present

## 2014-11-25 DIAGNOSIS — L293 Anogenital pruritus, unspecified: Secondary | ICD-10-CM | POA: Diagnosis not present

## 2014-11-25 DIAGNOSIS — R238 Other skin changes: Secondary | ICD-10-CM | POA: Diagnosis not present

## 2014-11-25 DIAGNOSIS — N938 Other specified abnormal uterine and vaginal bleeding: Secondary | ICD-10-CM | POA: Diagnosis not present

## 2014-11-25 DIAGNOSIS — N939 Abnormal uterine and vaginal bleeding, unspecified: Secondary | ICD-10-CM | POA: Diagnosis present

## 2014-11-25 DIAGNOSIS — N898 Other specified noninflammatory disorders of vagina: Secondary | ICD-10-CM

## 2014-11-25 LAB — CBC
HCT: 27.2 % — ABNORMAL LOW (ref 36.0–46.0)
Hemoglobin: 8.4 g/dL — ABNORMAL LOW (ref 12.0–15.0)
MCH: 24.7 pg — AB (ref 26.0–34.0)
MCHC: 30.9 g/dL (ref 30.0–36.0)
MCV: 80 fL (ref 78.0–100.0)
PLATELETS: 418 10*3/uL — AB (ref 150–400)
RBC: 3.4 MIL/uL — ABNORMAL LOW (ref 3.87–5.11)
RDW: 14.5 % (ref 11.5–15.5)
WBC: 8.7 10*3/uL (ref 4.0–10.5)

## 2014-11-25 LAB — URINE MICROSCOPIC-ADD ON

## 2014-11-25 LAB — URINALYSIS, ROUTINE W REFLEX MICROSCOPIC
Bilirubin Urine: NEGATIVE
Glucose, UA: NEGATIVE mg/dL
Ketones, ur: NEGATIVE mg/dL
Leukocytes, UA: NEGATIVE
Nitrite: NEGATIVE
Protein, ur: NEGATIVE mg/dL
Specific Gravity, Urine: >1.03 — ABNORMAL HIGH (ref 1.005–1.030)
UROBILINOGEN UA: 0.2 mg/dL (ref 0.0–1.0)
pH: 5.5 (ref 5.0–8.0)

## 2014-11-25 LAB — POCT PREGNANCY, URINE: Preg Test, Ur: NEGATIVE

## 2014-11-25 LAB — WET PREP, GENITAL
TRICH WET PREP: NONE SEEN
YEAST WET PREP: NONE SEEN

## 2014-11-25 MED ORDER — FERROUS FUMARATE-FOLIC ACID 324-1 MG PO TABS: 1.0000 | ORAL_TABLET | Freq: Two times a day (BID) | ORAL | Status: DC

## 2014-11-25 NOTE — Discharge Instructions (Signed)

## 2014-11-25 NOTE — MAU Provider Note (Signed)
History     CSN: [id number]  Arrival date and time: 11/25/14 1932 Provider initiated care at 2015 hrs on 11/25/14   None     Chief Complaint  Patient presents with  . Vaginal Bleeding   Vaginal Bleeding The patient's primary symptoms include genital itching, genital lesions ("bump", not painful) and vaginal bleeding. The patient's pertinent negatives include no genital odor, genital rash, missed menses, pelvic pain or vaginal discharge. This is a new problem. The current episode started more than 1 month ago. The problem occurs constantly. The problem has been unchanged. The pain is mild. She is not pregnant. Associated symptoms include abdominal pain (some light cramping). Pertinent negatives include no back pain, chills, constipation, diarrhea, dysuria, fever, frequency, headaches, nausea or vomiting. The vaginal discharge was bloody. The vaginal bleeding is lighter than menses. She has not been passing clots. She has not been passing tissue. Nothing aggravates the symptoms. She has tried nothing for the symptoms. It is unknown whether or not her partner has an STD. She uses oral contraceptives for contraception. Her menstrual history has been irregular.   This is a 24 y.o. female who presents with c/o persistent vaginal bleeding. Has been bleeding for 2 months.  Was heavy and now light. Started OCPs a month ago. Is on the third week of pack. States not bleeding today. Worried about a bump on her labia.  States her vagina "itches some".  States has not had sex in 3 months but wants STD testing "just in case".  Has no GYN provider.   RN Note: Two months ago I started my period and just kept going. Been on birth control pills for a month and finally started slowing down last few days. Now my vagina feels itchy and feels like "pimple" on vagina lips. Slightly tender but not bad. Crave ice.           OB History    Gravida Para Term Preterm AB TAB SAB Ectopic Multiple Living   3 1 1  0 2  1 1  0 0 1      Past Medical History  Diagnosis Date  . Asthma     childhood /grew out of it  . ADHD (attention deficit hyperactivity disorder)   . Urinary tract infection   . Trichomonas   . Fractured fibula     left  . Degenerative disk disease   . Abnormal Pap smear   . Chlamydia   . Depression     post partem  . History of PCOS   . Vaginal Pap smear, abnormal     normal since    Past Surgical History  Procedure Laterality Date  . Wisdom tooth extraction    . Induced abortion    . Dilation and curettage of uterus      Family History  Problem Relation Age of Onset  . Diabetes Mother   . Hypertension Mother   . Heart disease Mother   . Diabetes Maternal Grandfather   . Anesthesia problems Neg Hx   . Hypotension Neg Hx   . Malignant hyperthermia Neg Hx   . Pseudochol deficiency Neg Hx   . Heart disease Maternal Grandmother     chf    History  Substance Use Topics  . Smoking status: Current Every Day Smoker -- 0.50 packs/day for 7 years    Types: Cigarettes  . Smokeless tobacco: Never Used  . Alcohol Use: 0.0 oz/week     Comment: occ but was a heavy user  Allergies:  Allergies  Allergen Reactions  . Other Anaphylaxis    Grapes, strawberries, bananas, pineapple, oranges, nuts  . Betadine [Povidone Iodine] Itching    Betadine causes itching and skin redness and irritation  . Latex Itching  . Tramadol Nausea And Vomiting  . Tomato Itching and Rash    Prescriptions prior to admission  Medication Sig Dispense Refill Last Dose  . butalbital-acetaminophen-caffeine (FIORICET) 50-325-40 MG per tablet Take 1-2 tablets by mouth every 6 (six) hours as needed for headache. 20 tablet 0   . ibuprofen (ADVIL,MOTRIN) 200 MG tablet Take 400 mg by mouth every 6 (six) hours as needed for headache, mild pain or moderate pain.   11/09/2014 at Unknown time  . norgestimate-ethinyl estradiol (ORTHO-CYCLEN,SPRINTEC,PREVIFEM) 0.25-35 MG-MCG tablet Take 1 tablet by mouth  daily. (Patient not taking: Reported on 11/09/2014) 1 Package 11 Not Taking at Unknown time    Review of Systems  Constitutional: Negative for fever, chills and malaise/fatigue.  Gastrointestinal: Positive for abdominal pain (some light cramping). Negative for nausea, vomiting, diarrhea and constipation.  Genitourinary: Positive for vaginal bleeding. Negative for dysuria, frequency, vaginal discharge, pelvic pain and missed menses.  Musculoskeletal: Negative for back pain.  Neurological: Negative for dizziness and headaches.  Other systems negative  Physical Exam   Blood pressure 142/81, pulse 81, temperature 97.6 F (36.4 C), resp. rate 18, height  (1.575 m), weight 284 lb 12.8 oz (129.184 kg), last menstrual period 11/07/2014, SpO2 100 %.  Physical Exam  Constitutional: She is oriented to [place], place, and time. She appears well-developed and well-nourished. No distress.  HENT:  Head: Normocephalic.  Cardiovascular: Normal rate.   Respiratory: Effort normal. No respiratory distress.  GI: Soft.  Genitourinary:    Vaginal discharge (scant blood, no other discharge) found.  There is a small 1mm indurated lesion between mons and right labia, not visible, palpable under skin.   No vulvar erethema   Musculoskeletal: Normal range of motion.  Neurological: She is alert and oriented to [place], place, and time.  Skin: Skin is warm and dry.  Psychiatric: She has a normal mood and affect.    MAU Course  Procedures  MDM Wet prep sent GC/Chlamydia sent CBC ordered  Results for orders placed or performed during the hospital encounter of 11/25/14 (from the past 24 hour(s))  Urinalysis, Routine w reflex microscopic (not at [facility])     Status: Abnormal   Collection Time: 11/25/14  8:05 PM  Result Value Ref Range   Color, Urine YELLOW YELLOW   APPearance CLEAR CLEAR   Specific Gravity, Urine >1.030 (H) 1.005 - 1.030   pH 5.5 5.0 - 8.0   Glucose, UA NEGATIVE NEGATIVE mg/dL    Hgb urine dipstick LARGE (A) NEGATIVE   Bilirubin Urine NEGATIVE NEGATIVE   Ketones, ur NEGATIVE NEGATIVE mg/dL   Protein, ur NEGATIVE NEGATIVE mg/dL   Urobilinogen, UA 0.2 0.0 - 1.0 mg/dL   Nitrite NEGATIVE NEGATIVE   Leukocytes, UA NEGATIVE NEGATIVE  Urine microscopic-add on     Status: None   Collection Time: 11/25/14  8:05 PM  Result Value Ref Range   Squamous Epithelial / LPF RARE RARE   WBC, UA 0-2 <3 WBC/hpf   RBC / HPF 7-10 <3 RBC/hpf   Bacteria, UA RARE RARE  Pregnancy, urine POC     Status: None   Collection Time: 11/25/14  8:07 PM  Result Value Ref Range   Preg Test, Ur NEGATIVE NEGATIVE  CBC     Status: Abnormal  Collection Time: 11/25/14  8:33 PM  Result Value Ref Range   WBC 8.7 4.0 - 10.5 K/uL   RBC 3.40 (L) 3.87 - 5.11 MIL/uL   Hemoglobin 8.4 (L) 12.0 - 15.0 g/dL   HCT 16.1 (L) 09.6 - 04.5 %   MCV 80.0 78.0 - 100.0 fL   MCH 24.7 (L) 26.0 - 34.0 pg   MCHC 30.9 30.0 - 36.0 g/dL   RDW 40.9 81.1 - 91.4 %   Platelets 418 (H) 150 - 400 K/uL  Wet prep, genital     Status: Abnormal   Collection Time: 11/25/14  8:34 PM  Result Value Ref Range   Yeast Wet Prep HPF POC NONE SEEN NONE SEEN   Trich, Wet Prep NONE SEEN NONE SEEN   Clue Cells Wet Prep HPF POC FEW (A) NONE SEEN   WBC, Wet Prep HPF POC FEW (A) NONE SEEN    Assessment and Plan  A:  Genital itching with no evidence of yeast       Healing small indurated papule on labia       Dysfunctional uterine bleeding  P:  Advised no yeast       Will watch lesion, which is probably resolving       Advised to continue OCPs as directed since bleeding has lessened       Has appointment in clinic in August         [facility] 11/25/2014, 8:11 PM

## 2014-11-25 NOTE — MAU Note (Signed)
Two months ago I started my period and just kept going. Been on birth control pills  for a month and finally started slowing down last few days. Now my vagina feels itchy and feels like "pimple" on vagina lips. Slightly tender but not bad. Crave ice.

## 2014-11-28 LAB — GC/CHLAMYDIA PROBE AMP (~~LOC~~) NOT AT ARMC
Chlamydia: NEGATIVE
NEISSERIA GONORRHEA: NEGATIVE

## 2015-02-10 ENCOUNTER — Emergency Department (HOSPITAL_COMMUNITY)
Admission: EM | Admit: 2015-02-10 | Discharge: 2015-02-10 | Disposition: A | Discharge status: Home / Self Care | Payer: Medicaid Other | Attending: Emergency Medicine | Admitting: Emergency Medicine

## 2015-02-10 ENCOUNTER — Encounter (HOSPITAL_COMMUNITY): Payer: Self-pay | Admitting: Emergency Medicine

## 2015-02-10 DIAGNOSIS — Z87440 Personal history of urinary (tract) infections: Secondary | ICD-10-CM | POA: Insufficient documentation

## 2015-02-10 DIAGNOSIS — Z8619 Personal history of other infectious and parasitic diseases: Secondary | ICD-10-CM | POA: Diagnosis not present

## 2015-02-10 DIAGNOSIS — R040 Epistaxis: Secondary | ICD-10-CM | POA: Diagnosis present

## 2015-02-10 DIAGNOSIS — T148 Other injury of unspecified body region: Secondary | ICD-10-CM | POA: Diagnosis not present

## 2015-02-10 DIAGNOSIS — Z8679 Personal history of other diseases of the circulatory system: Secondary | ICD-10-CM | POA: Diagnosis not present

## 2015-02-10 DIAGNOSIS — Z91040 Latex allergy status: Secondary | ICD-10-CM | POA: Insufficient documentation

## 2015-02-10 DIAGNOSIS — Z720 Tobacco use: Secondary | ICD-10-CM | POA: Diagnosis not present

## 2015-02-10 DIAGNOSIS — R05 Cough: Secondary | ICD-10-CM | POA: Insufficient documentation

## 2015-02-10 DIAGNOSIS — Z8659 Personal history of other mental and behavioral disorders: Secondary | ICD-10-CM | POA: Diagnosis not present

## 2015-02-10 DIAGNOSIS — R51 Headache: Secondary | ICD-10-CM | POA: Insufficient documentation

## 2015-02-10 DIAGNOSIS — J45909 Unspecified asthma, uncomplicated: Secondary | ICD-10-CM | POA: Diagnosis not present

## 2015-02-10 DIAGNOSIS — Z8781 Personal history of (healed) traumatic fracture: Secondary | ICD-10-CM | POA: Diagnosis not present

## 2015-02-10 DIAGNOSIS — R519 Headache, unspecified: Secondary | ICD-10-CM

## 2015-02-10 LAB — BASIC METABOLIC PANEL
ANION GAP: 5 (ref 5–15)
BUN: 7 mg/dL (ref 6–20)
CHLORIDE: 108 mmol/L (ref 101–111)
CO2: 27 mmol/L (ref 22–32)
Calcium: 8.9 mg/dL (ref 8.9–10.3)
Creatinine, Ser: 0.7 mg/dL (ref 0.44–1.00)
GFR calc non Af Amer: >60 mL/min (ref >60)
Glucose, Bld: 104 mg/dL — ABNORMAL HIGH (ref 65–99)
POTASSIUM: 3.8 mmol/L (ref 3.5–5.1)
SODIUM: 140 mmol/L (ref 135–145)

## 2015-02-10 LAB — CBC
HEMATOCRIT: 33 % — AB (ref 36.0–46.0)
HEMOGLOBIN: 10.1 g/dL — AB (ref 12.0–15.0)
MCH: 22.3 pg — AB (ref 26.0–34.0)
MCHC: 30.6 g/dL (ref 30.0–36.0)
MCV: 72.8 fL — AB (ref 78.0–100.0)
PLATELETS: 375 10*3/uL (ref 150–400)
RBC: 4.53 MIL/uL (ref 3.87–5.11)
RDW: 17.5 % — ABNORMAL HIGH (ref 11.5–15.5)
WBC: 4.9 10*3/uL (ref 4.0–10.5)

## 2015-02-10 MED ORDER — METOCLOPRAMIDE HCL 10 MG PO TABS
5.0000 mg | ORAL_TABLET | Freq: Once | ORAL | Status: AC
  Administered 2015-02-10: 5 mg via ORAL
  Filled 2015-02-10: qty 1

## 2015-02-10 MED ORDER — OXYMETAZOLINE HCL 0.05 % NA SOLN: 1.0000 | Freq: Two times a day (BID) | NASAL | Status: DC

## 2015-02-10 MED ORDER — DEXAMETHASONE 0.5 MG PO TABS
1.0000 mg | ORAL_TABLET | Freq: Once | ORAL | Status: AC
  Administered 2015-02-10: 1 mg via ORAL
  Filled 2015-02-10: qty 2

## 2015-02-10 MED ORDER — DIPHENHYDRAMINE HCL 25 MG PO CAPS
25.0000 mg | ORAL_CAPSULE | Freq: Once | ORAL | Status: AC
  Administered 2015-02-10: 25 mg via ORAL
  Filled 2015-02-10: qty 1

## 2015-02-10 MED ORDER — KETOROLAC TROMETHAMINE 60 MG/2ML IM SOLN
60.0000 mg | Freq: Once | INTRAMUSCULAR | Status: AC
  Administered 2015-02-10: 60 mg via INTRAMUSCULAR
  Filled 2015-02-10: qty 2

## 2015-02-10 NOTE — ED Provider Notes (Signed)
CSN: [id number]     Arrival date & time 02/10/15  4098 History   First MD Initiated Contact with Patient 02/10/15 1055     Chief Complaint  Patient presents with  . Headache  . Epistaxis     (Consider location/radiation/quality/duration/timing/severity/associated sxs/prior Treatment) HPI   Teresa [name] is a 24 y.o. female with PMH significant for asthma and migraines who presents with headache that she has been having since March.  This headache is similar to her typical migraine headaches.  She has tried ibuprofen, but it hasn't helped.  Lights and sound make it worse.  Endorses cough.  No visual disturbances, weakness, fevers, ear pain, neck pain, rhinorrhea, CP, SOB, N/V/D, abdominal pain, urinary symptoms, or bloody stools.  Patient also presents with 3 day history of intermittent epistaxis.  Patient has experienced 2 episodes.  The first episode happened after she got out of the shower.  She states the bleeding continued for approximately 10 minutes and stopped after she applied pressure and tilted her head back.  She then states that the second episode occurred last night and lasted 10 minutes until she applied pressure and tilted her head back.  She states she has seen clots and that total blood lost is approximately half a cup.  No history of nose bleeds.  She does have seasonal allergies.    Past Medical History  Diagnosis Date  . Asthma     childhood /grew out of it  . ADHD (attention deficit hyperactivity disorder)   . Urinary tract infection   . Trichomonas   . Fractured fibula     left  . Degenerative disk disease   . Abnormal Pap smear   . Chlamydia   . Depression     post partem  . History of PCOS   . Vaginal Pap smear, abnormal     normal since   Past Surgical History  Procedure Laterality Date  . Wisdom tooth extraction    . Induced abortion    . Dilation and curettage of uterus     Family History  Problem Relation Age of Onset  . Diabetes Mother   .  Hypertension Mother   . Heart disease Mother   . Diabetes Maternal Grandfather   . Anesthesia problems Neg Hx   . Hypotension Neg Hx   . Malignant hyperthermia Neg Hx   . Pseudochol deficiency Neg Hx   . Heart disease Maternal Grandmother     chf   Social History  Substance Use Topics  . Smoking status: Current Every Day Smoker -- 0.50 packs/day for 7 years    Types: Cigarettes  . Smokeless tobacco: Never Used  . Alcohol Use: 0.0 oz/week     Comment: occ but was a heavy user in 2015 due to boyfriend passed away.   OB History    Gravida Para Term Preterm AB TAB SAB Ectopic Multiple Living   0 0 0 1     Review of Systems  All other systems negative unless otherwise stated in HPI   Allergies  Other; Betadine; Latex; Tramadol; and Tomato  Home Medications   Prior to Admission medications   Medication Sig Start Date End Date Taking? Authorizing Provider  butalbital-acetaminophen-caffeine (FIORICET) 50-325-40 MG per tablet Take 1-2 tablets by mouth every 6 (six) hours as needed for headache. Patient not taking: Reported on 02/10/2015 11/09/14 11/09/15  Pricilla Loveless, MD  Ferrous Fumarate-Folic Acid 324-1 MG TABS Take 1 tablet by mouth  2 (two) times daily. Patient not taking: Reported on 02/10/2015 11/25/14   Aviva Signs, CNM  norgestimate-ethinyl estradiol (ORTHO-CYCLEN,SPRINTEC,PREVIFEM) 0.25-35 MG-MCG tablet Take 1 tablet by mouth daily. Patient not taking: Reported on 02/10/2015 08/25/14   Wilmer Floor Leftwich-Kirby, CNM  oxymetazoline (AFRIN NASAL SPRAY) 0.05 % nasal spray Place 1 spray into both nostrils 2 (two) times daily. 02/10/15   Datrell Dunton, PA-C   BP 88/51 mmHg  Pulse 73  Temp(Src) 98.6 F (37 C) (Oral)  Resp 17  Ht 5' 1.5" (1.562 m)  Wt 285 lb (129.275 kg)  BMI 52.98 kg/m2  SpO2 98%  LMP 11/03/2014 Physical Exam  Constitutional: She is oriented to [place], place, and time. She appears well-developed and well-nourished.  HENT:  Head: Normocephalic  and atraumatic.  Right Ear: External ear normal.  Left Ear: External ear normal.  Nose: Sinus tenderness and nasal deformity present. No mucosal edema, rhinorrhea, septal deviation or nasal septal hematoma. No epistaxis.  No foreign bodies.  Mouth/Throat: Oropharynx is clear and moist.  Small abrasion/irritation on medial aspect of septum.  No active bleeding.  Eyes: Conjunctivae and EOM are normal. Pupils are equal, round, and reactive to light.  Neck: Normal range of motion. Neck supple.  Cardiovascular: Normal rate, regular rhythm and normal heart sounds.   No murmur heard. Pulmonary/Chest: Effort normal and breath sounds normal. No respiratory distress. She has no wheezes. She has no rales.  Abdominal: Soft. Bowel sounds are normal. She exhibits no distension. There is no tenderness.  Musculoskeletal: Normal range of motion.  Lymphadenopathy:    She has no cervical adenopathy.  Neurological: She is alert and oriented to [place], place, and time.  Mental Status:   AOx3 Cranial Nerves:  I-not tested  II-PERRLA  III, IV, VI-EOMs intact  V-temporal and masseter strength intact  VII-symmetrical facial movements intact, no facial droop  VIII-hearing grossly intact bilaterally  IX, X-gag intact  XI-strength of sternomastoid and trapezius muscles 5/5  XII-tongue midline Motor:   Good muscle bulk and tone  Strength 5/5 bilaterally in upper and lower extremities   Cerebellar--RAMs, finger to nose intact  Romberg--maintains balance with eyes closed  Casual and tandem gait normal without ataxia  No pronator drift Sensory:  Intact in upper and lower extremities      Skin: Skin is warm and dry.  Psychiatric: She has a normal mood and affect. Her behavior is normal.    ED Course  Procedures (including critical care time) Labs Review Labs Reviewed  BASIC METABOLIC PANEL - Abnormal; Notable for the following:    Glucose, Bld 104 (*)    All other components within normal limits   CBC - Abnormal; Notable for the following:    Hemoglobin 10.1 (*)    HCT 33.0 (*)    MCV 72.8 (*)    MCH 22.3 (*)    RDW 17.5 (*)    All other components within normal limits    Imaging Review No results found. I have personally reviewed and evaluated these images and lab results as part of my medical decision-making.   EKG Interpretation None      MDM   Final diagnoses:  Nonintractable headache, unspecified chronicity pattern, unspecified headache type  Left-sided epistaxis    Typical migraine headache for the pt. Non focal neuro exam. No recent head trauma. No fever. Doubt meningitis. Doubt intracranial bleed. Doubt normal pressure hydrocephalus. No indication for imaging. Will treat with migraine cocktail and reevaluate.  No active nose bleed.  This  is non-emergent.  Discussed return precautions. Will d/c home with Afrin and follow up with PCP.       Cheri FowlerKayla Marqueta Pulley, PA-C 02/10/15 1256  Lyndal Pulleyaniel Knott, MD 02/12/15 [phone]

## 2015-02-10 NOTE — Discharge Instructions (Signed)
Nosebleed  Nosebleeds are common. A nosebleed can be caused by many things, including:  · Getting hit hard in the nose.  · Infections.  · Dryness in your nose.  · A dry climate.  · Medicines.  · Picking your nose.  · Your home heating and cooling systems.  HOME CARE   · Try controlling your nosebleed by pinching your nostrils gently. Do this for at least 10 minutes.  · Avoid blowing or sniffing your nose for a number of hours after having a nosebleed.  · Do not put gauze inside of your nose yourself. If your nose was packed by your doctor, try to keep the pack inside of your nose until your doctor removes it.    If a gauze pack was used and it starts to fall out, gently replace it or cut off the end of it.    If a balloon catheter was used to pack your nose, do not cut or remove it unless told by your doctor.  · Avoid lying down while you are having a nosebleed. Sit up and lean forward.  · Use a nasal spray decongestant to help with a nosebleed as told by your doctor.  · Do not use petroleum jelly or mineral oil in your nose. These can drip into your lungs.  · Keep your house humid by using:    Less air conditioning.    A humidifier.  · Aspirin and blood thinners make bleeding more likely. If you are prescribed these medicines and you have nosebleeds, ask your doctor if you should stop taking the medicines or adjust the dose. Do not stop medicines unless told by your doctor.  · Resume your normal activities as you are able. Avoid straining, lifting, or bending at your waist for several days.  · If your nosebleed was caused by dryness in your nose, use over-the-counter saline nasal spray or gel. If you must use a lubricant:    Choose one that is water-soluble.    Use it only as needed.    Do not use it within several hours of lying down.  · Keep all follow-up visits as told by your doctor. This is important.  GET HELP IF:  · You have a fever.  · You get frequent nosebleeds.  · You are getting nosebleeds more  often.  GET HELP RIGHT AWAY IF:  · Your nosebleed lasts longer than 20 minutes.  · Your nosebleed occurs after an injury to your face, and your nose looks crooked or broken.  · You have unusual bleeding from other parts of your body.  · You have unusual bruising on other parts of your body.  · You feel light-headed or dizzy.  · You become sweaty.  · You throw up (vomit) blood.  · You have a nosebleed after a head injury.     This information is not intended to replace advice given to you by your health care provider. Make sure you discuss any questions you have with your health care provider.     Document Released: 01/23/2008 Document Revised: 05/06/2014 Document Reviewed: 11/29/2013  Elsevier Interactive Patient Education ©2016 [employer]

## 2015-02-10 NOTE — ED Notes (Signed)
Pt refusing X-ray.

## 2015-02-10 NOTE — ED Notes (Signed)
Pt c/o headache ongoing for some time since March. Pt reports left sided nosebleed the past couple of days.

## 2015-02-18 ENCOUNTER — Inpatient Hospital Stay (HOSPITAL_COMMUNITY)
Admission: AD | Admit: 2015-02-18 | Discharge: 2015-02-18 | Disposition: A | Discharge status: Home / Self Care | Payer: Medicaid Other | Source: Ambulatory Visit | Attending: Family Medicine | Admitting: Family Medicine

## 2015-02-18 ENCOUNTER — Encounter (HOSPITAL_COMMUNITY): Payer: Self-pay | Admitting: *Deleted

## 2015-02-18 DIAGNOSIS — F17210 Nicotine dependence, cigarettes, uncomplicated: Secondary | ICD-10-CM | POA: Insufficient documentation

## 2015-02-18 DIAGNOSIS — R300 Dysuria: Secondary | ICD-10-CM | POA: Diagnosis present

## 2015-02-18 DIAGNOSIS — N390 Urinary tract infection, site not specified: Secondary | ICD-10-CM | POA: Diagnosis not present

## 2015-02-18 DIAGNOSIS — N3000 Acute cystitis without hematuria: Secondary | ICD-10-CM

## 2015-02-18 LAB — URINALYSIS, ROUTINE W REFLEX MICROSCOPIC
BILIRUBIN URINE: NEGATIVE
Glucose, UA: NEGATIVE mg/dL
KETONES UR: NEGATIVE mg/dL
NITRITE: POSITIVE — AB
Protein, ur: NEGATIVE mg/dL
SPECIFIC GRAVITY, URINE: 1.025 (ref 1.005–1.030)
UROBILINOGEN UA: 0.2 mg/dL (ref 0.0–1.0)
pH: 6 (ref 5.0–8.0)

## 2015-02-18 LAB — WET PREP, GENITAL
Trich, Wet Prep: NONE SEEN
YEAST WET PREP: NONE SEEN

## 2015-02-18 LAB — URINE MICROSCOPIC-ADD ON

## 2015-02-18 LAB — POCT PREGNANCY, URINE: PREG TEST UR: NEGATIVE

## 2015-02-18 MED ORDER — PHENAZOPYRIDINE HCL 200 MG PO TABS: 200.0000 mg | ORAL_TABLET | Freq: Three times a day (TID) | ORAL | Status: DC

## 2015-02-18 MED ORDER — SULFAMETHOXAZOLE-TRIMETHOPRIM 800-160 MG PO TABS: 1.0000 | ORAL_TABLET | Freq: Two times a day (BID) | ORAL | Status: DC

## 2015-02-18 NOTE — MAU Provider Note (Signed)
History     CSN: [id number]  Arrival date and time: 02/18/15 4098   First Provider Initiated Contact with Patient 02/18/15 1010      Chief Complaint  Patient presents with  . Dysuria   HPI Teresa [name] 24 y.o. Comes to MAU with frequency and dysuria since yesterday.  Is urinating several times in an hour.  Reports she slept from 10 pm to 5 am.  Did not go to work today as she was having pain with urination.  She is having difficulty holding her urine when she feels the urge to go and then has pain with urination that radiates up to the clitoris.  Reports no pain when wiping.  Has not taken any medication.  Denies any fever or back pain.  OB History    Gravida Para Term Preterm AB TAB SAB Ectopic Multiple Living   0 0 0 1      Past Medical History  Diagnosis Date  . Asthma     childhood /grew out of it  . ADHD (attention deficit hyperactivity disorder)   . Urinary tract infection   . Trichomonas   . Fractured fibula     left  . Degenerative disk disease   . Abnormal Pap smear   . Chlamydia   . Depression     post partem  . History of PCOS   . Vaginal Pap smear, abnormal     normal since    Past Surgical History  Procedure Laterality Date  . Wisdom tooth extraction    . Induced abortion    . Dilation and curettage of uterus      Family History  Problem Relation Age of Onset  . Diabetes Mother   . Hypertension Mother   . Heart disease Mother   . Diabetes Maternal Grandfather   . Anesthesia problems Neg Hx   . Hypotension Neg Hx   . Malignant hyperthermia Neg Hx   . Pseudochol deficiency Neg Hx   . Heart disease Maternal Grandmother     chf    Social History  Substance Use Topics  . Smoking status: Current Every Day Smoker -- 0.50 packs/day for 7 years    Types: Cigarettes  . Smokeless tobacco: Never Used  . Alcohol Use: 0.0 oz/week     Comment: occ but was a heavy user in 2015 due to boyfriend passed away.    Allergies:  Allergies   Allergen Reactions  . Other Anaphylaxis    Grapes, strawberries, bananas, pineapple, oranges, nuts  . Pineapple Anaphylaxis and Swelling  . Betadine [Povidone Iodine] Itching    Betadine causes itching and skin redness and irritation  . Latex Itching  . Tramadol Nausea And Vomiting  . Tomato Itching and Rash    Prescriptions prior to admission  Medication Sig Dispense Refill Last Dose  . butalbital-acetaminophen-caffeine (FIORICET) 50-325-40 MG per tablet Take 1-2 tablets by mouth every 6 (six) hours as needed for headache. (Patient not taking: Reported on 02/10/2015) 20 tablet 0 Past Week at Unknown time  . Ferrous Fumarate-Folic Acid 324-1 MG TABS Take 1 tablet by mouth 2 (two) times daily. (Patient not taking: Reported on 02/10/2015) 60 each 1   . norgestimate-ethinyl estradiol (ORTHO-CYCLEN,SPRINTEC,PREVIFEM) 0.25-35 MG-MCG tablet Take 1 tablet by mouth daily. (Patient not taking: Reported on 02/10/2015) 1 Package 11 11/25/2014 at Unknown time  . oxymetazoline (AFRIN NASAL SPRAY) 0.05 % nasal spray Place 1 spray into both nostrils 2 (two) times daily. (  Patient not taking: Reported on 02/18/2015) 20 mL 0     Review of Systems  Constitutional: Negative for fever.  Gastrointestinal: Negative for nausea, vomiting and abdominal pain.  Genitourinary: Positive for dysuria, urgency and frequency. Negative for hematuria and flank pain.       No vaginal discharge   Physical Exam   Blood pressure 141/78, pulse 86, temperature 98.2 F (36.8 C), temperature source Oral, resp. rate 18, weight 278 lb 12.8 oz (126.463 kg), last menstrual period 11/03/2014.  Physical Exam  Nursing note and vitals reviewed. Constitutional: She is oriented to [place], place, and time. She appears well-developed and well-nourished.  HENT:  Head: Normocephalic.  Eyes: EOM are normal.  Neck: Neck supple.  GI: Soft. There is no tenderness.  Genitourinary:  Speculum exam: Vulva - no lesions, clitoris visible and  not covereed by clitoral hood - is pale pink with white discharge noted in folds at base of clitoris Vagina - Small amount of creamy discharge, no odor Cervix - No contact bleeding Bimanual exam: Cervix closed Uterus non tender,difficult to size due to habitus Adnexa non tender, exam limited by habitus GC/Chlam, wet prep done Chaperone present for exam.  Musculoskeletal: Normal range of motion.  Neurological: She is alert and oriented to [place], place, and time.  Skin: Skin is warm and dry.  Psychiatric: She has a normal mood and affect.    MAU Course  Procedures  MDM Results for orders placed or performed during the hospital encounter of 02/18/15 (from the past 24 hour(s))  Urinalysis, Routine w reflex microscopic [facility])     Status: Abnormal   Collection Time: 02/18/15  9:40 AM  Result Value Ref Range   Color, Urine YELLOW YELLOW   APPearance HAZY (A) CLEAR   Specific Gravity, Urine 1.025 1.005 - 1.030   pH 6.0 5.0 - 8.0   Glucose, UA NEGATIVE NEGATIVE mg/dL   Hgb urine dipstick SMALL (A) NEGATIVE   Bilirubin Urine NEGATIVE NEGATIVE   Ketones, ur NEGATIVE NEGATIVE mg/dL   Protein, ur NEGATIVE NEGATIVE mg/dL   Urobilinogen, UA 0.2 0.0 - 1.0 mg/dL   Nitrite POSITIVE (A) NEGATIVE   Leukocytes, UA MODERATE (A) NEGATIVE  Urine microscopic-add on     Status: Abnormal   Collection Time: 02/18/15  9:40 AM  Result Value Ref Range   Squamous Epithelial / LPF FEW (A) RARE   WBC, UA 7-10 <3 WBC/hpf   RBC / HPF 3-6 <3 RBC/hpf   Bacteria, UA MANY (A) RARE  Pregnancy, urine POC     Status: None   Collection Time: 02/18/15  9:56 AM  Result Value Ref Range   Preg Test, Ur NEGATIVE NEGATIVE  Wet prep, genital     Status: Abnormal   Collection Time: 02/18/15 10:20 AM  Result Value Ref Range   Yeast Wet Prep HPF POC NONE SEEN NONE SEEN   Trich, Wet Prep NONE SEEN NONE SEEN   Clue Cells Wet Prep HPF POC FEW (A) NONE SEEN   WBC, Wet Prep HPF POC FEW (A) NONE SEEN      Assessment and Plan  UTI  Plan eprescribed bactrim ds BID x 3 days and pyridium tid x 2 days. Advised to return if develops fever, worsening pain or body aches. Advised to be seen at the clinic downstairs where she goes for more effective contraception - currently using condoms sometimes for contraception.  Yanky Vanderburg 02/18/2015, 10:27 AM

## 2015-02-18 NOTE — MAU Note (Signed)
Everytime she pees she has pain that shoots through her vagina, to her clitoris. Frequency and urgency. Symptoms started yesterday

## 2015-02-18 NOTE — Discharge Instructions (Signed)
Drink at least 8 8-oz glasses of water every day. Take Tylenol 325 mg 2 tablets by mouth every 4 hours if needed for pain. Get your medications from the pharmacy and take as directed.

## 2015-02-20 LAB — GC/CHLAMYDIA PROBE AMP (~~LOC~~) NOT AT ARMC
CHLAMYDIA, DNA PROBE: NEGATIVE
Neisseria Gonorrhea: NEGATIVE

## 2015-04-11 ENCOUNTER — Inpatient Hospital Stay (HOSPITAL_COMMUNITY)
Admission: AD | Admit: 2015-04-11 | Discharge: 2015-04-11 | Disposition: A | Discharge status: Home / Self Care | Payer: Medicaid Other | Source: Ambulatory Visit | Attending: Family Medicine | Admitting: Family Medicine

## 2015-04-11 ENCOUNTER — Encounter (HOSPITAL_COMMUNITY): Payer: Self-pay | Admitting: *Deleted

## 2015-04-11 DIAGNOSIS — R3915 Urgency of urination: Secondary | ICD-10-CM | POA: Diagnosis present

## 2015-04-11 DIAGNOSIS — R350 Frequency of micturition: Secondary | ICD-10-CM | POA: Insufficient documentation

## 2015-04-11 DIAGNOSIS — Z87440 Personal history of urinary (tract) infections: Secondary | ICD-10-CM | POA: Insufficient documentation

## 2015-04-11 LAB — URINALYSIS, ROUTINE W REFLEX MICROSCOPIC
Bilirubin Urine: NEGATIVE
Glucose, UA: NEGATIVE mg/dL
Hgb urine dipstick: NEGATIVE
Ketones, ur: NEGATIVE mg/dL
LEUKOCYTES UA: NEGATIVE
NITRITE: NEGATIVE
PROTEIN: NEGATIVE mg/dL
SPECIFIC GRAVITY, URINE: 1.025 (ref 1.005–1.030)
pH: 6 (ref 5.0–8.0)

## 2015-04-11 LAB — POCT PREGNANCY, URINE: PREG TEST UR: NEGATIVE

## 2015-04-11 NOTE — MAU Provider Note (Signed)
Chief Complaint: Urinary Frequency   First Provider Initiated Contact with Patient 04/11/15 1033     SUBJECTIVE HPI: Teresa [name] is a 24 y.o. [id number] female who presents to [facility] reporting possible UTI. Has continues to have urgency and frequency since Tx for UTI in October. Completed Bactrim.   Associated signs and symptoms: Negative for fever, chills, flank pain, dysuria, hematuria.  Past Medical History  Diagnosis Date  . Asthma     childhood /grew out of it  . ADHD (attention deficit hyperactivity disorder)   . Urinary tract infection   . Trichomonas   . Fractured fibula     left  . Degenerative disk disease   . Abnormal Pap smear   . Chlamydia   . Depression     post partem  . History of PCOS   . Vaginal Pap smear, abnormal     normal since   OB History  Gravida Para Term Preterm AB SAB TAB Ectopic Multiple Living  0 0 0 1    # Outcome Date GA Lbr Len/2nd Weight Sex Delivery Anes PTL Lv  3 Term 11/27/11 [redacted]w[redacted]d 702:35 / 01:55 8 lb 5.7 oz (3.79 kg) F Vag-Spont EPI  Y  2 TAB           1 SAB              Past Surgical History  Procedure Laterality Date  . Wisdom tooth extraction    . Induced abortion    . Dilation and curettage of uterus     Social History   Social History  . Marital Status: Single    Spouse Name: N/A  . Number of Children: N/A  . Years of Education: N/A   Occupational History  . Not on file.   Social History Main Topics  . Smoking status: Current Every Day Smoker -- 0.50 packs/day for 7 years    Types: Cigarettes  . Smokeless tobacco: Never Used  . Alcohol Use: 0.0 oz/week     Comment: occ but was a heavy user in 2015 due to boyfriend passed away.  . Drug Use: Yes    Special: Marijuana     Comment: Last use today, last use was April 2016  . Sexual Activity: Yes    Birth Control/ Protection: Condom     Comment: last intercourse 3 weeks ago   Other Topics Concern  . Not on file   Social History  Narrative   No current facility-administered medications on file prior to encounter.   Current Outpatient Prescriptions on File Prior to Encounter  Medication Sig Dispense Refill  . Ferrous Fumarate-Folic Acid 324-1 MG TABS Take 1 tablet by mouth 2 (two) times daily. (Patient not taking: Reported on 02/10/2015) 60 each 1  . norgestimate-ethinyl estradiol (ORTHO-CYCLEN,SPRINTEC,PREVIFEM) 0.25-35 MG-MCG tablet Take 1 tablet by mouth daily. (Patient not taking: Reported on 02/10/2015) 1 Package 11  . phenazopyridine (PYRIDIUM) 200 MG tablet Take 1 tablet (200 mg total) by mouth 3 (three) times daily. (Patient not taking: Reported on 04/11/2015) 6 tablet 0  . sulfamethoxazole-trimethoprim (BACTRIM DS) 800-160 MG tablet Take 1 tablet by mouth 2 (two) times daily. (Patient not taking: Reported on 04/11/2015) 6 tablet 0   Allergies  Allergen Reactions  . Other Anaphylaxis    Grapes, strawberries, bananas, pineapple, oranges, nuts  . Pineapple Anaphylaxis and Swelling  . Betadine [Povidone Iodine] Itching    Betadine causes itching and skin redness and irritation  .  Latex Itching  . Tramadol Nausea And Vomiting  . Tomato Itching and Rash    I have reviewed the past Medical Hx, Surgical Hx, Social Hx, Allergies and Medications.   Review of Systems  Constitutional: Negative for fever and chills.  Gastrointestinal: Negative for abdominal pain.  Genitourinary: Positive for urgency and frequency. Negative for dysuria and hematuria.  Musculoskeletal: Negative for back pain.    OBJECTIVE Patient Vitals for the past 24 hrs:  BP Temp Pulse  04/11/15 0928 - 98 F (36.7 C) -  04/11/15 0926 141/80 mmHg - 85   Constitutional: Well-developed, well-nourished female in no acute distress.  Cardiovascular: normal rate Respiratory: normal rate and effort.  Neurologic: Alert and oriented x 4.  GU: Neg CVAT.  LAB RESULTS Results for orders placed or performed during the hospital encounter of  04/11/15 (from the past 24 hour(s))  Urinalysis, Routine w reflex microscopic (not at [facility])     Status: None   Collection Time: 04/11/15  9:52 AM  Result Value Ref Range   Color, Urine YELLOW YELLOW   APPearance CLEAR CLEAR   Specific Gravity, Urine 1.025 1.005 - 1.030   pH 6.0 5.0 - 8.0   Glucose, UA NEGATIVE NEGATIVE mg/dL   Hgb urine dipstick NEGATIVE NEGATIVE   Bilirubin Urine NEGATIVE NEGATIVE   Ketones, ur NEGATIVE NEGATIVE mg/dL   Protein, ur NEGATIVE NEGATIVE mg/dL   Nitrite NEGATIVE NEGATIVE   Leukocytes, UA NEGATIVE NEGATIVE  Pregnancy, urine POC     Status: None   Collection Time: 04/11/15  9:52 AM  Result Value Ref Range   Preg Test, Ur NEGATIVE NEGATIVE    IMAGING No results found.  MAU COURSE UA, UPT, urine culture.  MDM 24 year old nonpregnant female with continuing symptoms of UTI after treatment but no evidence of UTI on UA today. Will send culture and treat accordingly. No evidence of pyelonephritis or other emergent condition.  ASSESSMENT 1. Urinary frequency   2. History of UTI    PLAN Discharge home in stable condition. Urine culture pending. Discussed other causes of urinary urgency and frequency. If Culture negative patient needs to discuss further evaluation for the symptoms and they continue.  Follow-up Information    Follow up with Primary Care Provider.   Why:  As needed if symptoms worsen        Medication List    ASK your doctor about these medications        Ferrous Fumarate-Folic Acid 324-1 MG Tabs  Take 1 tablet by mouth 2 (two) times daily.     norgestimate-ethinyl estradiol 0.25-35 MG-MCG tablet  Commonly known as:  ORTHO-CYCLEN,SPRINTEC,PREVIFEM  Take 1 tablet by mouth daily.     phenazopyridine 200 MG tablet  Commonly known as:  PYRIDIUM  Take 1 tablet (200 mg total) by mouth 3 (three) times daily.     sulfamethoxazole-trimethoprim 800-160 MG tablet  Commonly known as:  BACTRIM DS  Take 1 tablet by mouth 2 (two) times  daily.         [place] Maky[place] [name], CNM 04/11/2015  10:34 AM

## 2015-04-11 NOTE — Discharge Instructions (Signed)
Urinary Frequency °The number of times a normal Nickles urinates depends upon how much liquid they take in and how much liquid they are losing. If the temperature is hot and there is high humidity, then the Brar will sweat more and usually breathe a little more frequently. These factors decrease the amount of frequency of urination that would be considered normal. °The amount you drink is easily determined, but the amount of fluid lost is sometimes more difficult to calculate.  °Fluid is lost in two ways: °· Sensible fluid loss is usually measured by the amount of urine that you get rid of. Losses of fluid can also occur with diarrhea. °· Insensible fluid loss is more difficult to measure. It is caused by evaporation. Insensible loss of fluid occurs through breathing and sweating. It usually ranges from a little less than a quart to a little more than a quart of fluid a day. °In normal temperatures and activity levels, the average Emily may urinate 4 to 7 times in a 24-hour period. Needing to urinate more often than that could indicate a problem. If one urinates 4 to 7 times in 24 hours and has large volumes each time, that could indicate a different problem from one who urinates 4 to 7 times a day and has small volumes. The time of urinating is also important. Most urinating should be done during the waking hours. Getting up at night to urinate frequently can indicate some problems. °CAUSES  °The bladder is the organ in your lower abdomen that holds urine. Like a balloon, it swells some as it fills up. Your nerves sense this and tell you it is time to head for the bathroom. There are a number of reasons that you might feel the need to urinate more often than usual. They include: °· Urinary tract infection. This is usually associated with other signs such as burning when you urinate. °· In men, problems with the prostate (a walnut-size gland that is located near the tube that carries urine out of your body). There  are two reasons why the prostate can cause an increased frequency of urination: °¨ An enlarged prostate that does not let the bladder empty well. If the bladder only half empties when you urinate, then it only has half the capacity to fill before you have to urinate again. °¨ The nerves in the bladder become more hypersensitive with an increased size of the prostate even if the bladder empties completely. °· Pregnancy. °· Obesity. Excess weight is more likely to cause a problem for women than for men. °· Bladder stones or other bladder problems. °· Caffeine. °· Alcohol. °· Medications. For example, drugs that help the body get rid of extra fluid (diuretics) increase urine production. Some other medicines must be taken with lots of fluids. °· Muscle or nerve weakness. This might be the result of a spinal cord injury, a stroke, multiple sclerosis, or Parkinson disease. °· Long-standing diabetes can decrease the sensation of the bladder. This loss of sensation makes it harder to sense the bladder needs to be emptied. Over a period of years, the bladder is stretched out by constant overfilling. This weakens the bladder muscles so that the bladder does not empty well and has less capacity to fill with new urine. °· Interstitial cystitis (also called painful bladder syndrome). This condition develops because the tissues that line the inside of the bladder are inflamed (inflammation is the body's way of reacting to injury or infection). It causes pain and frequent   urination. It occurs in women more often than in men. °DIAGNOSIS  °· To decide what might be causing your urinary frequency, your health care provider will probably: °¨ Ask about symptoms you have noticed. °¨ Ask about your overall health. This will include questions about any medications you are taking. °¨ Do a physical examination. °· Order some tests. These might include: °¨ A blood test to check for diabetes or other health issues that could be contributing  to the problem. °¨ Urine testing. This could measure the flow of urine and the pressure on the bladder. °¨ A test of your neurological system (the brain, spinal cord, and nerves). This is the system that senses the need to urinate. °¨ A bladder test to check whether it is emptying completely when you urinate. °¨ Cystoscopy. This test uses a thin tube with a tiny camera on it. It offers a look inside your urethra and bladder to see if there are problems. °¨ Imaging tests. You might be given a contrast dye and then asked to urinate. X-rays are taken to see how your bladder is working. °TREATMENT  °It is important for you to be evaluated to determine if the amount or frequency that you have is unusual or abnormal. If it is found to be abnormal, the cause should be determined and this can usually be found out easily. Depending upon the cause, treatment could include medication, stimulation of the nerves, or surgery. °There are not too many things that you can do as an individual to change your urinary frequency. It is important that you balance the amount of fluid intake needed to compensate for your activity and the temperature. Medical problems will be diagnosed and taken care of by your physician. There is no particular bladder training such as Kegel exercises that you can do to help urinary frequency. This is an exercise that is usually recommended for people who have leaking of urine when they laugh, cough, or sneeze. °HOME CARE INSTRUCTIONS  °· Take any medications your health care provider prescribed or suggested. Follow the directions carefully. °· Practice any lifestyle changes that are recommended. These might include: °¨ Drinking less fluid or drinking at different times of the day. If you need to urinate often during the night, for example, you may need to stop drinking fluids early in the evening. °¨ Cutting down on caffeine or alcohol. They both can make you need to urinate more often than normal. Caffeine  is found in coffee, tea, and sodas. °¨ Losing weight, if that is recommended. °· Keep a journal or a log. You might be asked to record how much you drink and when and where you feel the need to urinate. This will also help evaluate how well the treatment provided by your physician is working. °SEEK MEDICAL CARE IF:  °· Your need to urinate often gets worse. °· You feel increased pain or irritation when you urinate. °· You notice blood in your urine. °· You have questions about any medications that your health care provider recommended. °· You notice blood, pus, or swelling at the site of any test or treatment procedure. °· You develop a fever of more than 100.5°F (38.1°C). °SEEK IMMEDIATE MEDICAL CARE IF:  °You develop a fever of more than 102.0°F (38.9°C). °  °This information is not intended to replace advice given to you by your health care provider. Make sure you discuss any questions you have with your health care provider. °  °Document Released: 02/09/2009 Document Revised:   05/06/2014 Document Reviewed: 02/09/2009 °Elsevier Interactive Patient Education ©2016 Elsevier Inc[employer] ° °

## 2015-04-11 NOTE — MAU Note (Signed)
Pt presents to MAU with complaints of pressure with urination and pain. Pt states she was just treated in October for a UTI and states she took all of her medications

## 2015-04-13 LAB — URINE CULTURE

## 2015-06-12 ENCOUNTER — Ambulatory Visit (INDEPENDENT_AMBULATORY_CARE_PROVIDER_SITE_OTHER): Payer: Medicaid Other | Admitting: Obstetrics & Gynecology

## 2015-06-12 ENCOUNTER — Encounter: Payer: Self-pay | Admitting: Obstetrics & Gynecology

## 2015-06-12 VITALS — BP 121/53 | HR 91 | Temp 98.2°F | Ht 65.0 in | Wt 276.5 lb

## 2015-06-12 DIAGNOSIS — N946 Dysmenorrhea, unspecified: Secondary | ICD-10-CM

## 2015-06-12 DIAGNOSIS — N938 Other specified abnormal uterine and vaginal bleeding: Secondary | ICD-10-CM | POA: Insufficient documentation

## 2015-06-12 DIAGNOSIS — Z30011 Encounter for initial prescription of contraceptive pills: Secondary | ICD-10-CM | POA: Diagnosis not present

## 2015-06-12 DIAGNOSIS — N390 Urinary tract infection, site not specified: Secondary | ICD-10-CM | POA: Diagnosis not present

## 2015-06-12 LAB — POCT URINALYSIS DIP (DEVICE)
BILIRUBIN URINE: NEGATIVE
GLUCOSE, UA: NEGATIVE mg/dL
Hgb urine dipstick: NEGATIVE
Ketones, ur: NEGATIVE mg/dL
NITRITE: NEGATIVE
PH: 7 (ref 5.0–8.0)
Protein, ur: 30 mg/dL — AB
Specific Gravity, Urine: 1.025 (ref 1.005–1.030)
Urobilinogen, UA: 0.2 mg/dL (ref 0.0–1.0)

## 2015-06-12 MED ORDER — NORGESTIM-ETH ESTRAD TRIPHASIC 0.18/0.215/0.25 MG-25 MCG PO TABS: 1.0000 | ORAL_TABLET | Freq: Every day | ORAL | Status: DC

## 2015-06-12 NOTE — Progress Notes (Signed)
Patient ID: Teresa [name], female   DOB: 02-03-1991, 25 y.o.   MRN: [id number]  Chief Complaint  Patient presents with  . Menorrhagia    bleed for six to seven months  no bleeding now  HPI Teresa [name] is a 26 y.o. female.  [id number] Patient's last menstrual period was 04/30/2015 (lmp unknown). Patient had vaginal bleeding for 6 months last year that stopped when prescribed Provera by [facility]. She does not want to conceive and would like to try OCP again. She had BTB when using Seasonique.  HPI  Past Medical History  Diagnosis Date  . Asthma     childhood /grew out of it  . ADHD (attention deficit hyperactivity disorder)   . Urinary tract infection   . Trichomonas   . Fractured fibula     left  . Degenerative disk disease   . Abnormal Pap smear   . Chlamydia   . Depression     post partem  . History of PCOS   . Vaginal Pap smear, abnormal     normal since    Past Surgical History  Procedure Laterality Date  . Wisdom tooth extraction    . Induced abortion    . Dilation and curettage of uterus      Family History  Problem Relation Age of Onset  . Diabetes Mother   . Hypertension Mother   . Heart disease Mother   . Diabetes Maternal Grandfather   . Anesthesia problems Neg Hx   . Hypotension Neg Hx   . Malignant hyperthermia Neg Hx   . Pseudochol deficiency Neg Hx   . Heart disease Maternal Grandmother     chf    Social History Social History  Substance Use Topics  . Smoking status: Current Every Day Smoker -- 0.50 packs/day for 7 years    Types: Cigarettes  . Smokeless tobacco: Never Used  . Alcohol Use: 0.0 oz/week     Comment: occ but was a heavy user in 2015 due to boyfriend passed away.    Allergies  Allergen Reactions  . Other Anaphylaxis    Grapes, strawberries, bananas, pineapple, oranges, nuts  . Pineapple Anaphylaxis and Swelling  . Betadine [Povidone Iodine] Itching    Betadine causes itching and skin redness and irritation  .  Latex Itching  . Tramadol Nausea And Vomiting  . Tomato Itching and Rash    Current Outpatient Prescriptions  Medication Sig Dispense Refill  . Norgestimate-Ethinyl Estradiol Triphasic 0.18/0.215/0.25 MG-25 MCG tab Take 1 tablet by mouth daily. 1 Package 11   No current facility-administered medications for this visit.    Review of Systems Review of Systems  Constitutional: Negative for unexpected weight change.  Respiratory: Negative.   Genitourinary: Positive for dysuria, frequency and pelvic pain. Negative for vaginal bleeding, vaginal discharge and vaginal pain.    Blood pressure 121/53, pulse 91, temperature 98.2 F (36.8 C), temperature source Oral, height  (1.651 m), weight 276 lb 8 oz (125.42 kg), last menstrual period 04/30/2015.  Physical Exam Physical Exam  Constitutional: She is oriented to [place], place, and time. She appears well-developed. No distress.  Pulmonary/Chest: Effort normal.  Neurological: She is alert and oriented to [name], place, and time.  Psychiatric: She has a normal mood and affect. Her behavior is normal.    Data Reviewed Pap result and office notes UA Assessment    DUB, wants to use OCP for cycle control   Dysuria, need urine culture  Plan  Ortho Tricylen and may RTC 6 months   F/U urine culture   ARNOLD,JAMES 06/12/2015, 1:32 PM

## 2015-06-12 NOTE — Patient Instructions (Signed)
Oral Contraception Use Oral contraceptive pills (OCPs) are medicines taken to prevent pregnancy. OCPs work by preventing the ovaries from releasing eggs. The hormones in OCPs also cause the cervical mucus to thicken, preventing the sperm from entering the uterus. The hormones also cause the uterine lining to become thin, not allowing a fertilized egg to attach to the inside of the uterus. OCPs are highly effective when taken exactly as prescribed. However, OCPs do not prevent sexually transmitted diseases (STDs). Safe sex practices, such as using condoms along with an OCP, can help prevent STDs. Before taking OCPs, you may have a physical exam and Pap test. Your health care provider may also order blood tests if necessary. Your health care provider will make sure you are a good candidate for oral contraception. Discuss with your health care provider the possible side effects of the OCP you may be prescribed. When starting an OCP, it can take 2 to 3 months for the body to adjust to the changes in hormone levels in your body.  HOW TO TAKE ORAL CONTRACEPTIVE PILLS Your health care provider may advise you on how to start taking the first cycle of OCPs. Otherwise, you can:   Start on day 1 of your menstrual period. You will not need any backup contraceptive protection with this start time.   Start on the first Sunday after your menstrual period or the day you get your prescription. In these cases, you will need to use backup contraceptive protection for the first week.   Start the pill at any time of your cycle. If you take the pill within 5 days of the start of your period, you are protected against pregnancy right away. In this case, you will not need a backup form of birth control. If you start at any other time of your menstrual cycle, you will need to use another form of birth control for 7 days. If your OCP is the type called a minipill, it will protect you from pregnancy after taking it for 2 days (48  hours). After you have started taking OCPs:   If you forget to take 1 pill, take it as soon as you remember. Take the next pill at the regular time.   If you miss 2 or more pills, call your health care provider because different pills have different instructions for missed doses. Use backup birth control until your next menstrual period starts.   If you use a 28-day pack that contains inactive pills and you miss 1 of the last 7 pills (pills with no hormones), it will not matter. Throw away the rest of the non-hormone pills and start a new pill pack.  No matter which day you start the OCP, you will always start a new pack on that same day of the week. Have an extra pack of OCPs and a backup contraceptive method available in case you miss some pills or lose your OCP pack.  HOME CARE INSTRUCTIONS   Do not smoke.   Always use a condom to protect against STDs. OCPs do not protect against STDs.   Use a calendar to mark your menstrual period days.   Read the information and directions that came with your OCP. Talk to your health care provider if you have questions.  SEEK MEDICAL CARE IF:   You develop nausea and vomiting.   You have abnormal vaginal discharge or bleeding.   You develop a rash.   You miss your menstrual period.   You are losing   your hair.   You need treatment for mood swings or depression.   You get dizzy when taking the OCP.   You develop acne from taking the OCP.   You become pregnant.  SEEK IMMEDIATE MEDICAL CARE IF:   You develop chest pain.   You develop shortness of breath.   You have an uncontrolled or severe headache.   You develop numbness or slurred speech.   You develop visual problems.   You develop pain, redness, and swelling in the legs.    This information is not intended to replace advice given to you by your health care provider. Make sure you discuss any questions you have with your health care provider.   Document  Released: 04/04/2011 Document Revised: 05/06/2014 Document Reviewed: 10/04/2012 Elsevier Interactive Patient Education 2016 Elsevier Inc.  

## 2015-06-17 ENCOUNTER — Encounter (HOSPITAL_COMMUNITY): Payer: Self-pay | Admitting: *Deleted

## 2015-06-17 ENCOUNTER — Inpatient Hospital Stay (HOSPITAL_COMMUNITY)
Admission: AD | Admit: 2015-06-17 | Discharge: 2015-06-17 | Disposition: A | Discharge status: Home / Self Care | Payer: Medicaid Other | Source: Ambulatory Visit | Attending: Obstetrics and Gynecology | Admitting: Obstetrics and Gynecology

## 2015-06-17 DIAGNOSIS — Z91018 Allergy to other foods: Secondary | ICD-10-CM | POA: Insufficient documentation

## 2015-06-17 DIAGNOSIS — Z91040 Latex allergy status: Secondary | ICD-10-CM | POA: Diagnosis not present

## 2015-06-17 DIAGNOSIS — J45909 Unspecified asthma, uncomplicated: Secondary | ICD-10-CM | POA: Insufficient documentation

## 2015-06-17 DIAGNOSIS — N939 Abnormal uterine and vaginal bleeding, unspecified: Secondary | ICD-10-CM

## 2015-06-17 DIAGNOSIS — Z87440 Personal history of urinary (tract) infections: Secondary | ICD-10-CM | POA: Insufficient documentation

## 2015-06-17 DIAGNOSIS — Z888 Allergy status to other drugs, medicaments and biological substances status: Secondary | ICD-10-CM | POA: Diagnosis not present

## 2015-06-17 DIAGNOSIS — Z885 Allergy status to narcotic agent status: Secondary | ICD-10-CM | POA: Insufficient documentation

## 2015-06-17 DIAGNOSIS — M549 Dorsalgia, unspecified: Secondary | ICD-10-CM | POA: Diagnosis not present

## 2015-06-17 DIAGNOSIS — F909 Attention-deficit hyperactivity disorder, unspecified type: Secondary | ICD-10-CM | POA: Diagnosis not present

## 2015-06-17 DIAGNOSIS — F329 Major depressive disorder, single episode, unspecified: Secondary | ICD-10-CM | POA: Diagnosis not present

## 2015-06-17 DIAGNOSIS — F17210 Nicotine dependence, cigarettes, uncomplicated: Secondary | ICD-10-CM | POA: Diagnosis not present

## 2015-06-17 DIAGNOSIS — R300 Dysuria: Secondary | ICD-10-CM | POA: Insufficient documentation

## 2015-06-17 DIAGNOSIS — N946 Dysmenorrhea, unspecified: Secondary | ICD-10-CM

## 2015-06-17 DIAGNOSIS — R109 Unspecified abdominal pain: Secondary | ICD-10-CM | POA: Insufficient documentation

## 2015-06-17 DIAGNOSIS — Z3202 Encounter for pregnancy test, result negative: Secondary | ICD-10-CM | POA: Diagnosis not present

## 2015-06-17 LAB — URINALYSIS, ROUTINE W REFLEX MICROSCOPIC
Bilirubin Urine: NEGATIVE
Glucose, UA: NEGATIVE mg/dL
Hgb urine dipstick: NEGATIVE
Ketones, ur: NEGATIVE mg/dL
LEUKOCYTES UA: NEGATIVE
NITRITE: NEGATIVE
PH: 5.5 (ref 5.0–8.0)
Protein, ur: NEGATIVE mg/dL
SPECIFIC GRAVITY, URINE: 1.025 (ref 1.005–1.030)

## 2015-06-17 LAB — WET PREP, GENITAL
Clue Cells Wet Prep HPF POC: NONE SEEN
Sperm: NONE SEEN
Trich, Wet Prep: NONE SEEN
Yeast Wet Prep HPF POC: NONE SEEN

## 2015-06-17 LAB — CBC
HCT: 32.5 % — ABNORMAL LOW (ref 36.0–46.0)
Hemoglobin: 10.4 g/dL — ABNORMAL LOW (ref 12.0–15.0)
MCH: 24.3 pg — ABNORMAL LOW (ref 26.0–34.0)
MCHC: 32 g/dL (ref 30.0–36.0)
MCV: 75.9 fL — ABNORMAL LOW (ref 78.0–100.0)
Platelets: 329 10*3/uL (ref 150–400)
RBC: 4.28 MIL/uL (ref 3.87–5.11)
RDW: 17.5 % — ABNORMAL HIGH (ref 11.5–15.5)
WBC: 5.5 10*3/uL (ref 4.0–10.5)

## 2015-06-17 LAB — HCG, QUANTITATIVE, PREGNANCY: hCG, Beta Chain, Quant, S: <1 m[IU]/mL (ref <5)

## 2015-06-17 LAB — POCT PREGNANCY, URINE: PREG TEST UR: NEGATIVE

## 2015-06-17 NOTE — MAU Note (Signed)
Pt. States she took a home pregnancy test at home today and it was positive. Pt. States she has been having abdominal and back pain for 3 weeks. Has been spotting on and off. Unsure of LMP. Denies Nausea or vomiting right now but was nauseated this am. Pt. States she is constipated. Here for evaluation.

## 2015-06-17 NOTE — MAU Note (Signed)
Pt. Requesting discharge before lab results are back.

## 2015-06-17 NOTE — Discharge Instructions (Signed)
Abnormal Uterine Bleeding Abnormal uterine bleeding can affect women at various stages in life, including teenagers, women in their reproductive years, pregnant women, and women who have reached menopause. Several kinds of uterine bleeding are considered abnormal, including:  Bleeding or spotting between periods.   Bleeding after sexual intercourse.   Bleeding that is heavier or more than normal.   Periods that last longer than usual.  Bleeding after menopause.  Many cases of abnormal uterine bleeding are minor and simple to treat, while others are more serious. Any type of abnormal bleeding should be evaluated by your health care provider. Treatment will depend on the cause of the bleeding. HOME CARE INSTRUCTIONS Monitor your condition for any changes. The following actions may help to alleviate any discomfort you are experiencing:  Avoid the use of tampons and douches as directed by your health care provider.  Change your pads frequently. You should get regular pelvic exams and Pap tests. Keep all follow-up appointments for diagnostic tests as directed by your health care provider.  SEEK MEDICAL CARE IF:   Your bleeding lasts more than 1 week.   You feel dizzy at times.  SEEK IMMEDIATE MEDICAL CARE IF:   You pass out.   You are changing pads every 15 to 30 minutes.   You have abdominal pain.  You have a fever.   You become sweaty or weak.   You are passing large blood clots from the vagina.   You start to feel nauseous and vomit. MAKE SURE YOU:   Understand these instructions.  Will watch your condition.  Will get help right away if you are not doing well or get worse.   This information is not intended to replace advice given to you by your health care provider. Make sure you discuss any questions you have with your health care provider.   Document Released: 04/15/2005 Document Revised: 04/20/2013 Document Reviewed: 11/12/2012 Elsevier Interactive  Patient Education [employer]   Start your birth control pills after you look up your serum pregnancy test result and it is negative.

## 2015-06-17 NOTE — MAU Note (Signed)
Back and abdominal pain x 3 weeks, spotting this morning, none at present, no dysuria, having constipation, LMP unsure has had irregular periods.

## 2015-06-17 NOTE — MAU Provider Note (Signed)
History     CSN: [id number]  Arrival date and time: 06/17/15 1451   First Provider Initiated Contact with Patient 06/17/15 1606      Chief Complaint  Patient presents with  . Back Pain  . Abdominal Pain  . Vaginal Bleeding   HPI  Teresa [name] is a 25 y.o. [id number]. She presents with+ UPT at home this am, unknown LMP. Hx PCOS, bled x 6 m past yr, no bleeding x 3-68m, bled all of December. She has had spotting off/on past 2 wks- bright red with wiping. She has low abd pain like going to start menses, low back/upper buttock aching. No changes in discharge, odor or itching. She has urinary frequency, urgency and dysuria.   OB History    Gravida Para Term Preterm AB TAB SAB Ectopic Multiple Living   0 0 0 1      Past Medical History  Diagnosis Date  . Asthma     childhood /grew out of it  . ADHD (attention deficit hyperactivity disorder)   . Urinary tract infection   . Trichomonas   . Fractured fibula     left  . Degenerative disk disease   . Abnormal Pap smear   . Chlamydia   . Depression     post partem  . History of PCOS   . Vaginal Pap smear, abnormal     normal since    Past Surgical History  Procedure Laterality Date  . Wisdom tooth extraction    . Induced abortion    . Dilation and curettage of uterus      Family History  Problem Relation Age of Onset  . Diabetes Mother   . Hypertension Mother   . Heart disease Mother   . Diabetes Maternal Grandfather   . Anesthesia problems Neg Hx   . Hypotension Neg Hx   . Malignant hyperthermia Neg Hx   . Pseudochol deficiency Neg Hx   . Heart disease Maternal Grandmother     chf    Social History  Substance Use Topics  . Smoking status: Current Every Day Smoker -- 0.50 packs/day for 7 years    Types: Cigarettes  . Smokeless tobacco: Never Used  . Alcohol Use: 0.6 oz/week    1 Shots of liquor per week     Comment: occ but was a heavy user in 2015 due to boyfriend passed away.    Allergies:   Allergies  Allergen Reactions  . Other Anaphylaxis    Grapes, strawberries, bananas, pineapple, oranges, nuts  . Pineapple Anaphylaxis and Swelling  . Betadine [Povidone Iodine] Itching    Betadine causes itching and skin redness and irritation  . Latex Itching  . Tramadol Nausea And Vomiting  . Tomato Itching and Rash    Prescriptions prior to admission  Medication Sig Dispense Refill Last Dose  . diphenhydramine-acetaminophen (TYLENOL PM) 25-500 MG TABS tablet Take 1 tablet by mouth at bedtime as needed (sleep).   06/16/2015 at Unknown time  . Norgestimate-Ethinyl Estradiol Triphasic 0.18/0.215/0.25 MG-25 MCG tab Take 1 tablet by mouth daily. 1 Package 11 new rx    Review of Systems  Constitutional: Negative for fever and chills.  Gastrointestinal: Positive for abdominal pain. Negative for nausea, vomiting and diarrhea.  Genitourinary: Positive for dysuria, urgency and frequency.       Vaginal spotting  Musculoskeletal: Positive for back pain.   Physical Exam   Blood pressure 125/74, pulse 93, temperature 98.6 F (37  C), temperature source Oral, resp. rate 18, height 5' 1.5" (1.562 m), weight 125.102 kg (275 lb 12.8 oz), last menstrual period 04/30/2015.  Physical Exam  Nursing note and vitals reviewed. Constitutional: She is oriented to [place], place, and time. She appears well-developed and well-nourished.  Cardiovascular: Normal rate.   Respiratory: Effort normal.  GI: Soft. There is no tenderness.  Genitourinary:  Ext gen- nl anatomy, skin intact Vagina- small amt thin pink discharge Cx- closed Uterus-nl size, non tender Adn- non tender, no masses palp   Musculoskeletal: Normal range of motion.  Neurological: She is alert and oriented to [place], place, and time.  Skin: Skin is warm and dry.  Psychiatric: She has a normal mood and affect. Her behavior is normal.    MAU Course  Procedures  MDM Pt and her partner have to leave. BHCG is still pending. She  requests an U/S to see if there is a baby in there. Explained with neg UPT and uterus not enlarged on exam I do not think she is pregnant. Instructed her to check MyChart this evening, start her OCP's Rx this wk by GYN clinic. Results for orders placed or performed during the hospital encounter of 06/17/15 (from the past 24 hour(s))  Urinalysis, Routine w reflex microscopic (not at [facility])     Status: None   Collection Time: 06/17/15  3:20 PM  Result Value Ref Range   Color, Urine YELLOW YELLOW   APPearance CLEAR CLEAR   Specific Gravity, Urine 1.025 1.005 - 1.030   pH 5.5 5.0 - 8.0   Glucose, UA NEGATIVE NEGATIVE mg/dL   Hgb urine dipstick NEGATIVE NEGATIVE   Bilirubin Urine NEGATIVE NEGATIVE   Ketones, ur NEGATIVE NEGATIVE mg/dL   Protein, ur NEGATIVE NEGATIVE mg/dL   Nitrite NEGATIVE NEGATIVE   Leukocytes, UA NEGATIVE NEGATIVE  Pregnancy, urine POC     Status: None   Collection Time: 06/17/15  3:28 PM  Result Value Ref Range   Preg Test, Ur NEGATIVE NEGATIVE  Wet prep, genital     Status: Abnormal   Collection Time: 06/17/15  4:38 PM  Result Value Ref Range   Yeast Wet Prep HPF POC NONE SEEN NONE SEEN   Trich, Wet Prep NONE SEEN NONE SEEN   Clue Cells Wet Prep HPF POC NONE SEEN NONE SEEN   WBC, Wet Prep HPF POC FEW (A) NONE SEEN   Sperm NONE SEEN   hCG, quantitative, pregnancy     Status: None   Collection Time: 06/17/15  4:49 PM  Result Value Ref Range   hCG, Beta Chain, Quant, S <1 <5 mIU/mL  CBC     Status: Abnormal   Collection Time: 06/17/15  4:49 PM  Result Value Ref Range   WBC 5.5 4.0 - 10.5 K/uL   RBC 4.28 3.87 - 5.11 MIL/uL   Hemoglobin 10.4 (L) 12.0 - 15.0 g/dL   HCT 16.1 (L) 09.6 - 04.5 %   MCV 75.9 (L) 78.0 - 100.0 fL   MCH 24.3 (L) 26.0 - 34.0 pg   MCHC 32.0 30.0 - 36.0 g/dL   RDW 40.9 (H) 81.1 - 91.4 %   Platelets 329 150 - 400 K/uL    Assessment and Plan  BHCG is <1, pt is not pregnant Start OCP's as Rx in GYN clinic this wk Take iron for low  Hgb  [name] M. 06/17/2015, 4:31 PM

## 2015-06-18 LAB — HIV ANTIBODY (ROUTINE TESTING W REFLEX): HIV Screen 4th Generation wRfx: NONREACTIVE

## 2015-06-24 ENCOUNTER — Inpatient Hospital Stay (HOSPITAL_COMMUNITY)
Admission: AD | Admit: 2015-06-24 | Discharge: 2015-06-24 | Disposition: A | Discharge status: Home / Self Care | Payer: Medicaid Other | Source: Ambulatory Visit | Attending: Obstetrics & Gynecology | Admitting: Obstetrics & Gynecology

## 2015-06-24 ENCOUNTER — Encounter (HOSPITAL_COMMUNITY): Payer: Self-pay | Admitting: *Deleted

## 2015-06-24 ENCOUNTER — Inpatient Hospital Stay (HOSPITAL_COMMUNITY): Payer: Medicaid Other

## 2015-06-24 DIAGNOSIS — E282 Polycystic ovarian syndrome: Secondary | ICD-10-CM | POA: Diagnosis not present

## 2015-06-24 DIAGNOSIS — F17210 Nicotine dependence, cigarettes, uncomplicated: Secondary | ICD-10-CM | POA: Insufficient documentation

## 2015-06-24 DIAGNOSIS — J45909 Unspecified asthma, uncomplicated: Secondary | ICD-10-CM | POA: Insufficient documentation

## 2015-06-24 DIAGNOSIS — R109 Unspecified abdominal pain: Secondary | ICD-10-CM | POA: Diagnosis not present

## 2015-06-24 DIAGNOSIS — F329 Major depressive disorder, single episode, unspecified: Secondary | ICD-10-CM | POA: Diagnosis not present

## 2015-06-24 DIAGNOSIS — F909 Attention-deficit hyperactivity disorder, unspecified type: Secondary | ICD-10-CM | POA: Diagnosis not present

## 2015-06-24 LAB — WET PREP, GENITAL
CLUE CELLS WET PREP: NONE SEEN
SPERM: NONE SEEN
Trich, Wet Prep: NONE SEEN
YEAST WET PREP: NONE SEEN

## 2015-06-24 LAB — POCT PREGNANCY, URINE: Preg Test, Ur: NEGATIVE

## 2015-06-24 MED ORDER — IBUPROFEN 800 MG PO TABS
800.0000 mg | ORAL_TABLET | Freq: Once | ORAL | Status: AC
  Administered 2015-06-24: 800 mg via ORAL
  Filled 2015-06-24: qty 1

## 2015-06-24 MED ORDER — IBUPROFEN 800 MG PO TABS: 800.0000 mg | ORAL_TABLET | Freq: Three times a day (TID) | ORAL | Status: DC | PRN

## 2015-06-24 NOTE — Discharge Instructions (Signed)
See hand out.

## 2015-06-24 NOTE — MAU Note (Signed)
Pt presents to MAU with complaints of lower abdominal cramping for three weeks. Pt has been evaluated in the clinic and in MAU several times over the last month for the same complaints. Denies any vaginal bleeding or abnormal discharge

## 2015-06-24 NOTE — MAU Provider Note (Signed)
Chief Complaint: Abdominal Pain   First Provider Initiated Contact with Patient 06/24/15 [phone]     SUBJECTIVE HPI: Teresa [name] is a 25 y.o. [id number] female who presents to Maternity Admissions reporting low abdominal cramping 3 weeks. Concerned that she might be pregnant because she is had some nausea 1 week and "they wouldn't do a pregnancy test on me at the clinic" at her routine GYN appointment at [facility] outpatient clinic on 06/12/2015. However she was seen in maternity admissions on 06/17/2015 and had a hCG less than 1.   History of PCOS and abnormal uterine bleeding. Started on OCPs at clinic visit on 06/12/2015. States she started the pills 06/18/2015 and has been taking them as instructed.    Last intercourse 1 week ago. States she is in a mutually monogamous relationship. History of STDs.  Location: Suprapubic Quality: Cramping Severity: 10/10 on pain scale Duration: 3 weeks Context: None Timing: Intermittent Modifying factors: Partial relief with Advil PM. Associated signs and symptoms: Positive for nausea 1 week. Negative for fever, chills, vaginal bleeding, vaginal discharge, dyspareunia, dysuria, hematuria, urgency, frequency, vomiting, diarrhea, constipation.  Past Medical History  Diagnosis Date  . Asthma     childhood /grew out of it  . ADHD (attention deficit hyperactivity disorder)   . Urinary tract infection   . Trichomonas   . Fractured fibula     left  . Degenerative disk disease   . Abnormal Pap smear   . Chlamydia   . Depression     post partem  . History of PCOS   . Vaginal Pap smear, abnormal     normal since   OB History  Gravida Para Term Preterm AB SAB TAB Ectopic Multiple Living  0 0 0 1    # Outcome Date GA Lbr Len/2nd Weight Sex Delivery Anes PTL Lv  3 Term 11/27/11 [redacted]w[redacted]d 702:35 / 01:55 8 lb 5.7 oz (3.79 kg) F Vag-Spont EPI  Y  2 TAB           1 SAB              Past Surgical History  Procedure Laterality Date   . Wisdom tooth extraction    . Induced abortion    . Dilation and curettage of uterus     Social History   Social History  . Marital Status: Single    Spouse Name: N/A  . Number of Children: N/A  . Years of Education: N/A   Occupational History  . Not on file.   Social History Main Topics  . Smoking status: Current Every Day Smoker -- 0.50 packs/day for 7 years    Types: Cigarettes  . Smokeless tobacco: Never Used  . Alcohol Use: 0.6 oz/week    1 Shots of liquor per week     Comment: occ but was a heavy user in 2015 due to boyfriend passed away.  . Drug Use: Yes    Special: Marijuana     Comment: Last use today, last use was April 2016  . Sexual Activity: Yes    Birth Control/ Protection:  birth control pills      Comment: last intercourse 1 week ago   Other Topics Concern  . Not on file   Social History Narrative   No current facility-administered medications on file prior to encounter.   Current Outpatient Prescriptions on File Prior to Encounter  Medication Sig Dispense Refill  . Norgestimate-Ethinyl Estradiol Triphasic 0.18/0.215/0.25 MG-25  MCG tab Take 1 tablet by mouth daily. (Patient not taking: Reported on 06/24/2015) patient states she started to 19 2017  1 Package 11   Allergies  Allergen Reactions  . Other Anaphylaxis    Grapes, strawberries, bananas, pineapple, oranges, nuts  . Pineapple Anaphylaxis and Swelling  . Betadine [Povidone Iodine] Itching    Betadine causes itching and skin redness and irritation  . Latex Itching  . Tramadol Nausea And Vomiting  . Tomato Itching and Rash    I have reviewed the past Medical Hx, Surgical Hx, Social Hx, Allergies and Medications.   Review of Systems  Constitutional: Negative for fever, chills and appetite change.  Gastrointestinal: Positive for nausea and abdominal pain. Negative for vomiting, diarrhea, constipation and abdominal distention.  Genitourinary: Positive for menstrual problem. Negative for  dysuria, urgency, frequency, hematuria, flank pain, vaginal bleeding, vaginal discharge and dyspareunia.  Musculoskeletal: Negative for back pain.    OBJECTIVE Patient Vitals for the past 24 hrs:  BP Temp Pulse Resp  06/24/15 0931 140/86 mmHg 98.8 F (37.1 C) 92 18   Constitutional: Well-developed, well-nourished, obese female in no acute distress.  Cardiovascular: normal rate Respiratory: normal rate and effort.  GI: Abd soft, mild tenderness across the entire low abdomen. Pos BS x 4 Neurologic: Alert and oriented x 4.  GU: Neg CVAT.  PELVIC EXAM: NEFG, physiologic discharge, no blood noted, cervix closed. Uterus not obviously enlarged, but exam limited by body habitus. Mild right and moderate left adnexal tenderness. No masses.  No CMT.  LAB RESULTS Results for orders placed or performed during the hospital encounter of 06/24/15 (from the past 24 hour(s))  Pregnancy, urine POC     Status: None   Collection Time: 06/24/15  9:30 AM  Result Value Ref Range   Preg Test, Ur NEGATIVE NEGATIVE  Wet prep, genital     Status: Abnormal   Collection Time: 06/24/15 10:00 AM  Result Value Ref Range   Yeast Wet Prep HPF POC NONE SEEN NONE SEEN   Trich, Wet Prep NONE SEEN NONE SEEN   Clue Cells Wet Prep HPF POC NONE SEEN NONE SEEN   WBC, Wet Prep HPF POC FEW (A) NONE SEEN   Sperm NONE SEEN     IMAGING US Transvaginal Non-ob  06/24/2015  CLINICAL DATA:  Cramping since 05/31/2015. EXAM: TRANSABDOMINAL AND TRANSVAGINAL ULTRASOUND OF PELVIS TECHNIQUE: Both transabdominal and transvaginal ultrasound examinations of the pelvis were performed. Transabdominal technique was performed for global imaging of the pelvis including uterus, ovaries, adnexal regions, and pelvic cul-de-sac. It was necessary to proceed with endovaginal exam following the transabdominal exam to visualize the uterus, endometrium, ovaries and adnexa . COMPARISON:  03/03/2013 FINDINGS: Uterus Measurements: 9.5 x 4.2 x 5.2 cm. No  fibroids or other mass visualized. Endometrium Thickness: 8 mm in thickness.  No focal abnormality visualized. Right ovary Measurements: 4.2 x 2.2 x 2.6 cm. Several small peripheral follicles. No adnexal mass. Left ovary Measurements: 3.8 x 2.4 x 2.2 cm. Several small peripheral follicles. No adnexal mass. Other findings No abnormal free fluid. IMPRESSION: No acute findings. Several peripheral follicles in the ovaries bilaterally. This appearance can be seen with polycystic ovarian syndrome. Recommend clinical correlation. Electronically Signed   By: Charlett Nose M.D.   On: 06/24/2015 10:44   US Pelvis Complete  06/24/2015  CLINICAL DATA:  Cramping since 05/31/2015. EXAM: TRANSABDOMINAL AND TRANSVAGINAL ULTRASOUND OF PELVIS TECHNIQUE: Both transabdominal and transvaginal ultrasound examinations of the pelvis were performed. Transabdominal technique was performed for  global imaging of the pelvis including uterus, ovaries, adnexal regions, and pelvic cul-de-sac. It was necessary to proceed with endovaginal exam following the transabdominal exam to visualize the uterus, endometrium, ovaries and adnexa . COMPARISON:  03/03/2013 FINDINGS: Uterus Measurements: 9.5 x 4.2 x 5.2 cm. No fibroids or other mass visualized. Endometrium Thickness: 8 mm in thickness.  No focal abnormality visualized. Right ovary Measurements: 4.2 x 2.2 x 2.6 cm. Several small peripheral follicles. No adnexal mass. Left ovary Measurements: 3.8 x 2.4 x 2.2 cm. Several small peripheral follicles. No adnexal mass. Other findings No abnormal free fluid. IMPRESSION: No acute findings. Several peripheral follicles in the ovaries bilaterally. This appearance can be seen with polycystic ovarian syndrome. Recommend clinical correlation. Electronically Signed   By: Charlett Nose M.D.   On: 06/24/2015 10:44    MAU COURSE UPT, UA, wet prep, GC/chlamydia cultures, pelvic ultrasound, ibuprofen.  Partial improvement in pain with  ibuprofen.  MDM 25 year old nonpregnant female with known history of PCOS w/ ultrasound today showing bilateral polycystic ovaries. No true cysts. History and exam not consistent with ovarian torsion. No evidence of PID or emergent condition. Patient is nontoxic appearing.    ASSESSMENT 1. Polycystic ovarian syndrome   2. Abdominal pain in female     PLAN Discharge home in stable condition. Discussed the nature of PCOS with patient at length and encouraged her to stay on schedule with birth control pills. Handout given. Encouraged weight loss to see if that would improve her PCOS symptoms. Schedule follow-up appointment in clinic in 3 months. Follow-up Information    Follow up with [facility].   Specialty:  Obstetrics and Gynecology   Why:  For non-emergent gynecology care or as needed if symptoms worsen,    Contact information:   [address] [place] [address] [phone]      Follow up with [facility] MATERNITY ADMISSIONS.   Why:  As needed in gynecologic emergencies   Contact information:   [address] [id number] mc [place] [address] [phone]       Medication List    TAKE these medications        ibuprofen 800 MG tablet  Commonly known as:  ADVIL,MOTRIN  Take 1 tablet (800 mg total) by mouth every 8 (eight) hours as needed for cramping.     Norgestimate-Ethinyl Estradiol Triphasic 0.18/0.215/0.25 MG-25 MCG tab  Take 1 tablet by mouth daily.       [place], [place] 06/24/2015  11:33 AM

## 2015-06-26 LAB — GC/CHLAMYDIA PROBE AMP (~~LOC~~) NOT AT ARMC
CHLAMYDIA, DNA PROBE: NEGATIVE
Neisseria Gonorrhea: NEGATIVE

## 2016-01-19 ENCOUNTER — Encounter (HOSPITAL_COMMUNITY): Payer: Self-pay | Admitting: *Deleted

## 2016-01-19 ENCOUNTER — Telehealth: Payer: Self-pay | Admitting: Advanced Practice Midwife

## 2016-01-19 ENCOUNTER — Inpatient Hospital Stay (HOSPITAL_COMMUNITY)
Admission: AD | Admit: 2016-01-19 | Discharge: 2016-01-19 | Disposition: A | Discharge status: Home / Self Care | Payer: Medicaid Other | Source: Ambulatory Visit | Attending: Obstetrics and Gynecology | Admitting: Obstetrics and Gynecology

## 2016-01-19 DIAGNOSIS — N760 Acute vaginitis: Principal | ICD-10-CM

## 2016-01-19 DIAGNOSIS — N898 Other specified noninflammatory disorders of vagina: Secondary | ICD-10-CM | POA: Insufficient documentation

## 2016-01-19 DIAGNOSIS — B9689 Other specified bacterial agents as the cause of diseases classified elsewhere: Secondary | ICD-10-CM

## 2016-01-19 LAB — URINALYSIS, ROUTINE W REFLEX MICROSCOPIC
BILIRUBIN URINE: NEGATIVE
Glucose, UA: NEGATIVE mg/dL
KETONES UR: NEGATIVE mg/dL
LEUKOCYTES UA: NEGATIVE
NITRITE: NEGATIVE
Protein, ur: NEGATIVE mg/dL
Specific Gravity, Urine: 1.02 (ref 1.005–1.030)
pH: 7 (ref 5.0–8.0)

## 2016-01-19 LAB — POCT PREGNANCY, URINE: PREG TEST UR: NEGATIVE

## 2016-01-19 LAB — URINE MICROSCOPIC-ADD ON: WBC UA: NONE SEEN WBC/hpf (ref 0–5)

## 2016-01-19 LAB — WET PREP, GENITAL
SPERM: NONE SEEN
Trich, Wet Prep: NONE SEEN
Yeast Wet Prep HPF POC: NONE SEEN

## 2016-01-19 MED ORDER — METRONIDAZOLE 500 MG PO TABS: 500.0000 mg | ORAL_TABLET | Freq: Two times a day (BID) | ORAL | 0 refills | Status: DC

## 2016-01-19 NOTE — MAU Note (Signed)
Thinks she has a bacterial infection,  Odor like she has had before.  Been having some back pain and pain in lower abd. Started on Monday.

## 2016-01-19 NOTE — MAU Provider Note (Signed)
Chief Complaint: vag odor; Back Pain; and Abdominal Pain   First Provider Initiated Contact with Patient 01/19/16 1002     SUBJECTIVE HPI: Teresa [name] is a 25 y.o. [id number] female who presents to Maternity Admissions reporting vaginal odor, mild pelvic pain and low back pain. Thinks she has BV.   Location: low abd and low back Quality: dull Severity: 2/10 on pain scale Duration: 5 days  Context: None Timing: intermittent Modifying factors: None Associated signs and symptoms: Pos for abd and low back pain. Neg for fever, chills, vaginal bleeding, urinary complaints or GI complaints.   Past Medical History:  Diagnosis Date  . Abnormal Pap smear   . ADHD (attention deficit hyperactivity disorder)   . Asthma    childhood /grew out of it  . Chlamydia   . Degenerative disk disease   . Depression    post partem  . Fractured fibula    left  . History of PCOS   . Trichomonas   . Urinary tract infection   . Vaginal Pap smear, abnormal    normal since   OB History  Gravida Para Term Preterm AB Living  3 1 1  0 2 1  SAB TAB Ectopic Multiple Live Births  1 1 0 0 1    # Outcome Date GA Lbr Len/2nd Weight Sex Delivery Anes PTL Lv  3 Term 11/27/11 [redacted]w[redacted]d 702:35 / 01:55 8 lb 5.7 oz (3.79 kg) F Vag-Spont EPI  LIV  2 TAB           1 SAB              Past Surgical History:  Procedure Laterality Date  . DILATION AND CURETTAGE OF UTERUS    . INDUCED ABORTION    . WISDOM TOOTH EXTRACTION     Social History   Social History  . Marital status: Single    Spouse name: N/A  . Number of children: N/A  . Years of education: N/A   Occupational History  . Not on file.   Social History Main Topics  . Smoking status: Current Every Day Smoker    Packs/day: 0.50    Years: 7.00    Types: Cigarettes  . Smokeless tobacco: Never Used  . Alcohol use 0.6 oz/week    1 Shots of liquor per week     Comment: occ but was a heavy user in 2015 due to boyfriend passed away.  . Drug use:    Types: Marijuana     Comment: Last use today, last use was April 2016  . Sexual activity: Yes    Birth control/ protection: Condom     Comment: last intercourse 3 weeks ago   Other Topics Concern  . Not on file   Social History Narrative  . No narrative on file   No current facility-administered medications on file prior to encounter.    Current Outpatient Prescriptions on File Prior to Encounter  Medication Sig Dispense Refill  . ibuprofen (ADVIL,MOTRIN) 800 MG tablet Take 1 tablet (800 mg total) by mouth every 8 (eight) hours as needed for cramping. (Patient not taking: Reported on 01/19/2016) 30 tablet 1  . Norgestimate-Ethinyl Estradiol Triphasic 0.18/0.215/0.25 MG-25 MCG tab Take 1 tablet by mouth daily. (Patient not taking: Reported on 06/24/2015) 1 Package 11   Allergies  Allergen Reactions  . Other Anaphylaxis    Grapes, strawberries, bananas, pineapple, oranges, nuts  . Pineapple Anaphylaxis and Swelling  . Betadine [Povidone Iodine] Itching    Betadine  causes itching and skin redness and irritation  . Latex Itching  . Tramadol Nausea And Vomiting  . Tomato Itching and Rash    I have reviewed the past Medical Hx, Surgical Hx, Social Hx, Allergies and Medications.   Review of Systems  Constitutional: Negative for chills and fever.  Gastrointestinal: Positive for abdominal pain. Negative for constipation, diarrhea, nausea and vomiting.  Genitourinary: Positive for vaginal discharge. Negative for dysuria, hematuria, vaginal bleeding and vaginal pain.  Musculoskeletal: Negative for back pain.    OBJECTIVE Patient Vitals for the past 24 hrs:  BP Temp Temp src Pulse Resp Weight  01/19/16 0934 139/82 99.1 F (37.3 C) Oral 91 16 284 lb 4 oz (128.9 kg)   Constitutional: Well-developed, well-nourished female in no acute distress.  Cardiovascular: normal rate Respiratory: normal rate and effort.  GI: Abd soft, non-tender. Pos BS x 4 MS: Extremities nontender, no edema,  normal ROM Neurologic: Alert and oriented x 4.  GU: Neg CVAT.  SPECULUM EXAM: NEFG, small amount of thin, white, mildly malodorous discharge, no blood noted, cervix clean  BIMANUAL: cervix closed; uterus normal size, no adnexal tenderness or masses. No CMT.  LAB RESULTS Results for orders placed or performed during the hospital encounter of 01/19/16 (from the past 24 hour(s))  Urinalysis, Routine w reflex microscopic (not at [facility])     Status: Abnormal   Collection Time: 01/19/16  9:30 AM  Result Value Ref Range   Color, Urine YELLOW YELLOW   APPearance CLEAR CLEAR   Specific Gravity, Urine 1.020 1.005 - 1.030   pH 7.0 5.0 - 8.0   Glucose, UA NEGATIVE NEGATIVE mg/dL   Hgb urine dipstick TRACE (A) NEGATIVE   Bilirubin Urine NEGATIVE NEGATIVE   Ketones, ur NEGATIVE NEGATIVE mg/dL   Protein, ur NEGATIVE NEGATIVE mg/dL   Nitrite NEGATIVE NEGATIVE   Leukocytes, UA NEGATIVE NEGATIVE  Urine microscopic-add on     Status: Abnormal   Collection Time: 01/19/16  9:30 AM  Result Value Ref Range   Squamous Epithelial / LPF 0-5 (A) NONE SEEN   WBC, UA NONE SEEN 0 - 5 WBC/hpf   RBC / HPF 0-5 0 - 5 RBC/hpf   Bacteria, UA RARE (A) NONE SEEN  Pregnancy, urine POC     Status: None   Collection Time: 01/19/16  9:48 AM  Result Value Ref Range   Preg Test, Ur NEGATIVE NEGATIVE    IMAGING No results found.  MAU COURSE Orders Placed This Encounter  Procedures  . Wet prep, genital  . Urinalysis, Routine w reflex microscopic (not at [facility])  . Urine microscopic-add on  . Pregnancy, urine POC   Pt requesting to leave before results are back.   MDM - Possible BV. Wet prep pending. No evidence of emergent condition. Will call w/ results.   ASSESSMENT 1. Discharge of vagina     PLAN Discharge home in stable condition. Abd pain Precautions Wet prep, GC/Chlamydia pending Follow-up Information    Center for [facility] .   Specialty:  Obstetrics and Gynecology Why:  for routine  gynecology care Contact information: [address] [place] [address] [phone]       [facility] [place] MATERNITY ADMISSIONS .   Why:  for Gyn emergencies Contact information: [address] [id number] mc [place] [address] [phone]           Medication List    STOP taking these medications   Norgestimate-Ethinyl Estradiol Triphasic 0.18/0.215/0.25 MG-25 MCG tab  TAKE these medications   ibuprofen 800 MG tablet Commonly known as:  ADVIL,MOTRIN Take 1 tablet (800 mg total) by mouth every 8 (eight) hours as needed for cramping.   multivitamin with minerals Tabs tablet Take 1 tablet by mouth daily.        [place] Alekai Pocock, CNM 01/19/2016  11:49 AM

## 2016-01-19 NOTE — Telephone Encounter (Signed)
Dx BV. Rx Flagyl.  

## 2016-01-19 NOTE — Discharge Instructions (Signed)

## 2016-01-22 LAB — GC/CHLAMYDIA PROBE AMP (~~LOC~~) NOT AT ARMC
Chlamydia: NEGATIVE
Neisseria Gonorrhea: NEGATIVE

## 2016-03-07 ENCOUNTER — Ambulatory Visit: Payer: Medicaid Other | Admitting: Family

## 2016-03-08 ENCOUNTER — Ambulatory Visit: Payer: Medicaid Other | Admitting: Family Medicine

## 2016-03-13 ENCOUNTER — Inpatient Hospital Stay (HOSPITAL_COMMUNITY)
Admission: AD | Admit: 2016-03-13 | Discharge: 2016-03-13 | Discharge status: Against Medical Advice | Payer: Medicaid Other | Source: Ambulatory Visit | Attending: Obstetrics and Gynecology | Admitting: Obstetrics and Gynecology

## 2016-03-13 ENCOUNTER — Ambulatory Visit (INDEPENDENT_AMBULATORY_CARE_PROVIDER_SITE_OTHER): Payer: Medicaid Other | Admitting: *Deleted

## 2016-03-13 DIAGNOSIS — Z3202 Encounter for pregnancy test, result negative: Secondary | ICD-10-CM

## 2016-03-13 DIAGNOSIS — N898 Other specified noninflammatory disorders of vagina: Secondary | ICD-10-CM

## 2016-03-13 DIAGNOSIS — Z3200 Encounter for pregnancy test, result unknown: Secondary | ICD-10-CM

## 2016-03-13 LAB — POCT PREGNANCY, URINE: Preg Test, Ur: NEGATIVE

## 2016-03-13 MED ORDER — FLUCONAZOLE 150 MG PO TABS: 150.0000 mg | ORAL_TABLET | Freq: Once | ORAL | 0 refills | Status: AC

## 2016-03-13 NOTE — Progress Notes (Signed)
Here for c/o thinks she has a yeast infection. States just finished flagyl and is having vaginal itching/ irritation. Wants meds for yeast.  Has ran out of birth control meds for 2 months - wants refill. Informed her she will need to make a md appt for birth control . Also wanted a pregnancy test- last unprotected intercourse 3 weeks ago.  UPT negative. Requested wet prep to make sure is not bv again, wet prep sent.

## 2016-03-13 NOTE — MAU Note (Signed)
Not in lobby #3. Noted pt has appt at 1530 today in clinic

## 2016-03-13 NOTE — MAU Note (Addendum)
Teresa [name] told other pt, she "had to go pick up her daughter and would reschedule".  Pt not in lobby at time of this conversation (0945)

## 2016-03-13 NOTE — MAU Note (Signed)
#  2 not in lobby 

## 2016-03-14 ENCOUNTER — Telehealth: Payer: Self-pay | Admitting: General Practice

## 2016-03-14 DIAGNOSIS — N760 Acute vaginitis: Secondary | ICD-10-CM

## 2016-03-14 DIAGNOSIS — B379 Candidiasis, unspecified: Secondary | ICD-10-CM

## 2016-03-14 DIAGNOSIS — B9689 Other specified bacterial agents as the cause of diseases classified elsewhere: Secondary | ICD-10-CM

## 2016-03-14 LAB — WET PREP, GENITAL: TRICH WET PREP: NONE SEEN

## 2016-03-14 MED ORDER — FLUCONAZOLE 150 MG PO TABS: 150.0000 mg | ORAL_TABLET | Freq: Once | ORAL | 0 refills | Status: AC

## 2016-03-14 MED ORDER — METRONIDAZOLE 500 MG PO TABS: 500.0000 mg | ORAL_TABLET | Freq: Two times a day (BID) | ORAL | 0 refills | Status: DC

## 2016-03-14 NOTE — Telephone Encounter (Signed)
Per Dr Debroah LoopArnold, patient has yeast & BV and needs flagyl & diflucan sent to pharmacy. meds ordered. Called patient, no answer- phone kept ringing then disconnected. Will send letter

## 2016-04-04 ENCOUNTER — Emergency Department (HOSPITAL_COMMUNITY)
Admission: EM | Admit: 2016-04-04 | Discharge: 2016-04-04 | Disposition: A | Discharge status: Home / Self Care | Payer: Medicaid Other | Attending: Emergency Medicine | Admitting: Emergency Medicine

## 2016-04-04 ENCOUNTER — Encounter (HOSPITAL_COMMUNITY): Payer: Self-pay | Admitting: Emergency Medicine

## 2016-04-04 DIAGNOSIS — M545 Low back pain, unspecified: Secondary | ICD-10-CM

## 2016-04-04 DIAGNOSIS — F17210 Nicotine dependence, cigarettes, uncomplicated: Secondary | ICD-10-CM | POA: Insufficient documentation

## 2016-04-04 DIAGNOSIS — F909 Attention-deficit hyperactivity disorder, unspecified type: Secondary | ICD-10-CM | POA: Insufficient documentation

## 2016-04-04 DIAGNOSIS — J45909 Unspecified asthma, uncomplicated: Secondary | ICD-10-CM | POA: Insufficient documentation

## 2016-04-04 DIAGNOSIS — Z91040 Latex allergy status: Secondary | ICD-10-CM | POA: Insufficient documentation

## 2016-04-04 LAB — URINALYSIS, ROUTINE W REFLEX MICROSCOPIC
BILIRUBIN URINE: NEGATIVE
Bacteria, UA: NONE SEEN
GLUCOSE, UA: NEGATIVE mg/dL
KETONES UR: NEGATIVE mg/dL
LEUKOCYTES UA: NEGATIVE
NITRITE: NEGATIVE
PH: 6 (ref 5.0–8.0)
Protein, ur: NEGATIVE mg/dL
Specific Gravity, Urine: 1.021 (ref 1.005–1.030)
Squamous Epithelial / LPF: NONE SEEN

## 2016-04-04 LAB — BASIC METABOLIC PANEL
Anion gap: 7 (ref 5–15)
BUN: 6 mg/dL (ref 6–20)
CALCIUM: 8.7 mg/dL — AB (ref 8.9–10.3)
CO2: 23 mmol/L (ref 22–32)
CREATININE: 0.54 mg/dL (ref 0.44–1.00)
Chloride: 105 mmol/L (ref 101–111)
GFR calc Af Amer: >60 mL/min (ref >60)
GFR calc non Af Amer: >60 mL/min (ref >60)
GLUCOSE: 93 mg/dL (ref 65–99)
Potassium: 4.1 mmol/L (ref 3.5–5.1)
Sodium: 135 mmol/L (ref 135–145)

## 2016-04-04 LAB — CBC WITH DIFFERENTIAL/PLATELET
BASOS PCT: 0 %
Basophils Absolute: 0 10*3/uL (ref 0.0–0.1)
EOS ABS: 0.4 10*3/uL (ref 0.0–0.7)
Eosinophils Relative: 6 %
HEMATOCRIT: 37.8 % (ref 36.0–46.0)
Hemoglobin: 12.5 g/dL (ref 12.0–15.0)
LYMPHS ABS: 2.1 10*3/uL (ref 0.7–4.0)
Lymphocytes Relative: 34 %
MCH: 28 pg (ref 26.0–34.0)
MCHC: 33.1 g/dL (ref 30.0–36.0)
MCV: 84.8 fL (ref 78.0–100.0)
MONO ABS: 0.3 10*3/uL (ref 0.1–1.0)
MONOS PCT: 5 %
Neutro Abs: 3.4 10*3/uL (ref 1.7–7.7)
Neutrophils Relative %: 55 %
Platelets: 289 10*3/uL (ref 150–400)
RBC: 4.46 MIL/uL (ref 3.87–5.11)
RDW: 14.5 % (ref 11.5–15.5)
WBC: 6.1 10*3/uL (ref 4.0–10.5)

## 2016-04-04 LAB — I-STAT BETA HCG BLOOD, ED (MC, WL, AP ONLY): I-stat hCG, quantitative: <5 m[IU]/mL (ref <5)

## 2016-04-04 MED ORDER — CYCLOBENZAPRINE HCL 10 MG PO TABS: 10.0000 mg | ORAL_TABLET | Freq: Two times a day (BID) | ORAL | 0 refills | Status: DC | PRN

## 2016-04-04 MED ORDER — NAPROXEN 250 MG PO TABS
500.0000 mg | ORAL_TABLET | Freq: Once | ORAL | Status: AC
  Administered 2016-04-04: 500 mg via ORAL
  Filled 2016-04-04: qty 2

## 2016-04-04 NOTE — ED Notes (Signed)
Placed into a gown

## 2016-04-04 NOTE — Discharge Instructions (Signed)
Your work up was negative.  You may take ONE 500 mg oxygen twice a day. He may also take 1000 mg of Tylenol every 6 hours, up to 3 times a day. You may not take any more pain medications of any other kind with these medicines. Please read the information about home care for low back strain. There does not appear to be any emergent cause of your symptoms such as kidney infection. Your blood work is normal. Patient's resolve in the next several days. SEEK IMMEDIATE MEDICAL ATTENTION IF: New numbness, tingling, weakness, or problem with the use of your arms or legs.  Severe back pain not relieved with medications.  Change in bowel or bladder control.  Increasing pain in any areas of the body (such as chest or abdominal pain).  Shortness of breath, dizziness or fainting.  Nausea (feeling sick to your stomach), vomiting, fever, or sweats.

## 2016-04-04 NOTE — ED Triage Notes (Signed)
Patient states her period started x 2 days ago and has been having "horrible back pain since".  Patient states this is the first time she has had that problem and that she has taken ibuprofen, aleve and tylenol for the pain with no relief.   Patient states PCOS and period is "off".

## 2016-04-04 NOTE — ED Notes (Signed)
Gave pt heat pack for back

## 2016-04-04 NOTE — ED Provider Notes (Signed)
[facility]EMER[facility]ENCY DEPT Provider Note   CSN: [id number] Arrival date & time: 04/04/16  [id number]     History   Chief Complaint Chief Complaint  Patient presents with  . Back Pain    HPI Teresa [name] is a 25 y.o. female who presents to the emergency department with chief complaint of back pain.  She complains of bilateral lumbar pain that began yesterday. She states there was no inciting event. She feels that it is similar to contractions during labor, but it's constant and unrelenting. The patient took 1000 mg of naproxen and 800 mg of ibuprofen. Within an hour to try alleviate her symptoms. She feels that it is worse with any movement and better at rest. She denies any numbness or tingling in the legs, fevers, chills, urinary symptoms, weakness in the lower extremities, saddle anesthesia or procedures to her back. The patient did start her menstruation cycle yesterday. She is unsure of her last menstrual period because she is PCOS is and they are irregular.   HPI    Past Medical History:  Diagnosis Date  . Abnormal Pap smear   . ADHD (attention deficit hyperactivity disorder)   . Asthma    childhood /grew out of it  . Chlamydia   . Degenerative disk disease   . Depression    post partem  . Fractured fibula    left  . History of PCOS   . Trichomonas   . Urinary tract infection   . Vaginal Pap smear, abnormal    normal since    Patient Active Problem List   Diagnosis Date Noted  . DUB (dysfunctional uterine bleeding) 06/12/2015  . Breast mass, right 01/11/2014  . Dysmenorrhea 04/15/2012    Past Surgical History:  Procedure Laterality Date  . DILATION AND CURETTAGE OF UTERUS    . INDUCED ABORTION    . WISDOM TOOTH EXTRACTION      OB History    Gravida Para Term Preterm AB Living   3 1 1  0 2 1   SAB TAB Ectopic Multiple Live Births   1 1 0 0 1       Home Medications    Prior to Admission medications   Medication Sig Start Date End Date Taking? Authorizing  Provider  ibuprofen (ADVIL,MOTRIN) 800 MG tablet Take 1 tablet (800 mg total) by mouth every 8 (eight) hours as needed for cramping. 06/24/15  Yes Dorathy KinsmanVirginia Smith, CNM  metroNIDAZOLE (FLAGYL) 500 MG tablet Take 1 tablet (500 mg total) by mouth 2 (two) times daily. Patient not taking: Reported on 04/04/2016 03/14/16   Adam PhenixJames G Arnold, MD    Family History Family History  Problem Relation Age of Onset  . Diabetes Mother   . Hypertension Mother   . Heart disease Mother   . Diabetes Maternal Grandfather   . Heart disease Maternal Grandmother     chf  . Anesthesia problems Neg Hx   . Hypotension Neg Hx   . Malignant hyperthermia Neg Hx   . Pseudochol deficiency Neg Hx     Social History Social History  Substance Use Topics  . Smoking status: Current Every Day Smoker    Packs/day: 0.50    Years: 7.00    Types: Cigarettes  . Smokeless tobacco: Never Used  . Alcohol use 0.6 oz/week    1 Shots of liquor per week     Comment: occ but was a heavy user in 2015 due to boyfriend passed away.     Allergies  Other; Pineapple; Betadine [povidone iodine]; Latex; Tramadol; and Tomato   Review of Systems Review of Systems  Ten systems reviewed and are negative for acute change, except as noted in the HPI.   Physical Exam Updated Vital Signs BP 122/56 (BP Location: Left Arm)   Pulse 69   Temp 98.1 F (36.7 C) (Oral)   Resp 18   Ht 5\' 1"  (1.549 m)   Wt 127 kg   LMP 04/04/2016   SpO2 98%   BMI 52.91 kg/m   Physical Exam  Constitutional: She is oriented to [place], place, and time. She appears well-developed and well-nourished. No distress.  HENT:  Head: Normocephalic and atraumatic.  Eyes: Conjunctivae are normal. No scleral icterus.  Neck: Normal range of motion.  Cardiovascular: Normal rate, regular rhythm and normal heart sounds.  Exam reveals no gallop and no friction rub.   No murmur heard. Pulmonary/Chest: Effort normal and breath sounds normal. No respiratory distress.    Abdominal: Soft. Bowel sounds are normal. She exhibits no distension and no mass. There is no tenderness. There is no guarding.  Musculoskeletal:       Lumbar back: She exhibits decreased range of motion and tenderness. She exhibits no bony tenderness and no swelling.       Back:  Neurological: She is alert and oriented to [place], place, and time.  Skin: Skin is warm and dry. She is not diaphoretic.  Nursing note and vitals reviewed.    ED Treatments / Results  Labs (all labs ordered are listed, but only abnormal results are displayed) Labs Reviewed  URINALYSIS, ROUTINE W REFLEX MICROSCOPIC  BASIC METABOLIC PANEL  CBC WITH DIFFERENTIAL/PLATELET  I-STAT BETA HCG BLOOD, ED ([facility], [facility], [facility] ONLY)    EKG  EKG Interpretation None       Radiology No results found.  Procedures Procedures (including critical care time)  Medications Ordered in ED Medications - No data to display   Initial Impression / Assessment and Plan / ED Course  I have reviewed the triage vital signs and the nursing notes.  Pertinent labs & imaging results that were available during my care of the patient were reviewed by me and considered in my medical decision making (see chart for details).  Clinical Course as of Apr 04 1132  Thu Apr 04, 2016  1057 Patient with what clinically appears to be lumbar spasm. No radicular sxs. Consider labor, pyelo/uti, kidney stone.  [AH]    Clinical Course User Index [AH] Arthor CaptainAbigail Del Overfelt, PA-C    Patient with back pain.  No neurological deficits and normal neuro exam.  Patient can walk but states is painful.  No loss of bowel or bladder control.  No concern for cauda equina.  No fever, night sweats, weight loss, h/o cancer, IVDU.  RICE protocol and pain medicine indicated and discussed with patient.   Final Clinical Impressions(s) / ED Diagnoses   Final diagnoses:  None    New Prescriptions New Prescriptions   No medications on file     Arthor Captainbigail Joycelyn Liska,  PA-C 04/05/16 1911    Doug SouSam Jacubowitz, MD 04/08/16 1340

## 2016-07-14 DIAGNOSIS — Z91040 Latex allergy status: Secondary | ICD-10-CM | POA: Insufficient documentation

## 2016-07-14 DIAGNOSIS — J45909 Unspecified asthma, uncomplicated: Secondary | ICD-10-CM | POA: Insufficient documentation

## 2016-07-14 DIAGNOSIS — M5412 Radiculopathy, cervical region: Secondary | ICD-10-CM | POA: Diagnosis not present

## 2016-07-14 DIAGNOSIS — M79601 Pain in right arm: Secondary | ICD-10-CM | POA: Diagnosis present

## 2016-07-14 DIAGNOSIS — F17210 Nicotine dependence, cigarettes, uncomplicated: Secondary | ICD-10-CM | POA: Insufficient documentation

## 2016-07-14 DIAGNOSIS — F909 Attention-deficit hyperactivity disorder, unspecified type: Secondary | ICD-10-CM | POA: Diagnosis not present

## 2016-07-15 ENCOUNTER — Encounter (HOSPITAL_COMMUNITY): Payer: Self-pay

## 2016-07-15 ENCOUNTER — Emergency Department (HOSPITAL_COMMUNITY)
Admission: EM | Admit: 2016-07-15 | Discharge: 2016-07-15 | Disposition: A | Discharge status: Home / Self Care | Payer: Medicaid Other | Attending: Emergency Medicine | Admitting: Emergency Medicine

## 2016-07-15 DIAGNOSIS — M5412 Radiculopathy, cervical region: Secondary | ICD-10-CM

## 2016-07-15 MED ORDER — PREDNISONE 20 MG PO TABS: 40.0000 mg | ORAL_TABLET | Freq: Every day | ORAL | 0 refills | Status: DC

## 2016-07-15 MED ORDER — METHOCARBAMOL 500 MG PO TABS
750.0000 mg | ORAL_TABLET | Freq: Once | ORAL | Status: AC
  Administered 2016-07-15: 750 mg via ORAL
  Filled 2016-07-15: qty 2

## 2016-07-15 MED ORDER — METHOCARBAMOL 500 MG PO TABS: 500.0000 mg | ORAL_TABLET | Freq: Two times a day (BID) | ORAL | 0 refills | Status: DC

## 2016-07-15 MED ORDER — PREDNISONE 20 MG PO TABS
60.0000 mg | ORAL_TABLET | Freq: Once | ORAL | Status: AC
  Administered 2016-07-15: 60 mg via ORAL
  Filled 2016-07-15: qty 3

## 2016-07-15 NOTE — Discharge Instructions (Signed)
Take tylenol for pain. Robaxin for muscle spasms. Take prednisone as prescribed until all gone for inflammation. Avoid strenuous activity. Follow up with family doctor. If not improving or if develop weakness in your arm, return to ED.

## 2016-07-15 NOTE — ED Provider Notes (Signed)
[facility] Provider Note   CSN: [id number] Arrival date & time: 07/14/16  2353     History   Chief Complaint Chief Complaint  Patient presents with  . Arm Pain    HPI Teresa [name] is a 26 y.o. female.  HPI Teresa [name] is a [age] y.o. female with history of degenerative disc disease in lower back, asthma, ADHD, anxiety, presents to emergency department complaining of right arm and hand tingling and numbness. She states her symptoms started 3 days ago. States it is worse in the morning and comes and goes during the day. This evening it has been more intense, worse when lying down and worse with movement of the hand. She states she is having some pain shooting down from the shoulder to the hand as well. She denies any weakness to the hand. Denies any neck pain. No injuries. No history of similar symptoms in the past. States she looked of her symptoms of global was worried she may have a heart attack or stroke.  Past Medical History:  Diagnosis Date  . Abnormal Pap smear   . ADHD (attention deficit hyperactivity disorder)   . Asthma    childhood /grew out of it  . Chlamydia   . Degenerative disk disease   . Depression    post partem  . Fractured fibula    left  . History of PCOS   . Trichomonas   . Urinary tract infection   . Vaginal Pap smear, abnormal    normal since    Patient Active Problem List   Diagnosis Date Noted  . DUB (dysfunctional uterine bleeding) 06/12/2015  . Breast mass, right 01/11/2014  . Dysmenorrhea 04/15/2012    Past Surgical History:  Procedure Laterality Date  . DILATION AND CURETTAGE OF UTERUS    . INDUCED ABORTION    . WISDOM TOOTH EXTRACTION      OB History    Gravida Para Term Preterm AB Living   3 1 1  0 2 1   SAB TAB Ectopic Multiple Live Births   1 1 0 0 1       Home Medications    Prior to Admission medications   Medication Sig Start Date End Date Taking? Authorizing Provider  cyclobenzaprine (FLEXERIL) 10 MG  tablet Take 1 tablet (10 mg total) by mouth 2 (two) times daily as needed for muscle spasms. 04/04/16   Arthor CaptainAbigail Harris, PA-C  ibuprofen (ADVIL,MOTRIN) 800 MG tablet Take 1 tablet (800 mg total) by mouth every 8 (eight) hours as needed for cramping. 06/24/15   Dorathy KinsmanVirginia Smith, CNM  metroNIDAZOLE (FLAGYL) 500 MG tablet Take 1 tablet (500 mg total) by mouth 2 (two) times daily. Patient not taking: Reported on 04/04/2016 03/14/16   Adam PhenixJames G Arnold, MD    Family History Family History  Problem Relation Age of Onset  . Diabetes Mother   . Hypertension Mother   . Heart disease Mother   . Diabetes Maternal Grandfather   . Heart disease Maternal Grandmother     chf  . Anesthesia problems Neg Hx   . Hypotension Neg Hx   . Malignant hyperthermia Neg Hx   . Pseudochol deficiency Neg Hx     Social History Social History  Substance Use Topics  . Smoking status: Current Every Day Smoker    Packs/day: 0.50    Years: 7.00    Types: Cigarettes  . Smokeless tobacco: Never Used  . Alcohol use 0.6 oz/week    1 Shots of  liquor per week     Comment: occ but was a heavy user in 2015 due to boyfriend passed away.     Allergies   Other; Pineapple; Betadine [povidone iodine]; Latex; Tramadol; and Tomato   Review of Systems Review of Systems  Constitutional: Negative for chills and fever.  Respiratory: Negative for cough, chest tightness and shortness of breath.   Cardiovascular: Negative for chest pain, palpitations and leg swelling.  Gastrointestinal: Negative for abdominal pain, diarrhea, nausea and vomiting.  Genitourinary: Negative for dysuria, flank pain, pelvic pain, vaginal bleeding, vaginal discharge and vaginal pain.  Musculoskeletal: Positive for arthralgias. Negative for myalgias, neck pain and neck stiffness.  Skin: Negative for rash.  Neurological: Positive for numbness. Negative for dizziness, weakness and headaches.  All other systems reviewed and are negative.    Physical  Exam Updated Vital Signs BP 130/77 (BP Location: Left Arm)   Pulse 88   Temp 98.2 F (36.8 C) (Oral)   Resp 16   Ht 5\' 2"  (1.575 m)   LMP 05/14/2016 (Approximate)   SpO2 99%   Physical Exam  Constitutional: She appears well-developed and well-nourished. No distress.  HENT:  Head: Normocephalic.  Eyes: Conjunctivae are normal.  Neck: Neck supple.  Cardiovascular: Normal rate, regular rhythm and normal heart sounds.   Pulmonary/Chest: Effort normal and breath sounds normal. No respiratory distress. She has no wheezes. She has no rales.  Musculoskeletal: She exhibits no edema.  Distal radial pulse intact and equal bilaterally. Full range of motion of all fingers and wrist on the right hand.  Neurological: She is alert.  Decreased sensation over all dermatomes of the arm, forearm, hand. Patient is able to do "thumbs up", spread all of her fingers, oppose her thumb to every finger. She is able to make a fist with full strength of flexion and extension of all fingers at every joint. Hand is warm and pink.  Skin: Skin is warm and dry.  Psychiatric: She has a normal mood and affect. Her behavior is normal.  Nursing note and vitals reviewed.    ED Treatments / Results  Labs (all labs ordered are listed, but only abnormal results are displayed) Labs Reviewed - No data to display  EKG  EKG Interpretation None       Radiology No results found.  Procedures Procedures (including critical care time)  Medications Ordered in ED Medications - No data to display   Initial Impression / Assessment and Plan / ED Course  I have reviewed the triage vital signs and the nursing notes.  Pertinent labs & imaging results that were available during my care of the patient were reviewed by me and considered in my medical decision making (see chart for details).     Patient in emergency department with tingling and numbness sensation to the right arm and hand. It is worse with movement of  the hand and worse when laying down and movement of the neck. It involves entire arm. She has full strength of the biceps, triceps, hand grip. She expresses her concern about possibly having a mild or a stroke, I reassured her, I do not think she has an MI given no other associated symptoms, no risk factors, normal vital signs. I do not think she has CVA given pain in the arm and symptoms coming and going for 3 days. Question cervical radiculopathy. Will treat with steroids, Tylenol, Robaxin. Follow with primary care doctor. Return precautions discussed.  Vitals:   07/14/16 2357  BP: 130/77  Pulse: 88  Resp: 16  Temp: 98.2 F (36.8 C)  TempSrc: Oral  SpO2: 99%  Height: 5\' 2"  (1.575 m)     Final Clinical Impressions(s) / ED Diagnoses   Final diagnoses:  Cervical radiculopathy    New Prescriptions New Prescriptions   No medications on file     Jaynie Crumble, PA-C 07/15/16 0101    Tilden Fossa, MD 07/18/16 [phone]

## 2016-07-15 NOTE — ED Notes (Signed)
Patient Alert and oriented X4. Stable and ambulatory. Patient verbalized understanding of the discharge instructions.  Patient belongings were taken by the patient.  

## 2016-07-15 NOTE — ED Triage Notes (Signed)
Pt complaining of R hand numbness and sharp pains running down arm. Pt states pain starts in R shoulder, runs down arm. Pt denies any injury/trauma. Pt denies any chest pain or SOB. Pt with full ROM.

## 2016-07-15 NOTE — ED Notes (Signed)
Patient ambulated to the room independently no signs of distress noted.  Patient states that she has had the burning all day and cannot hold anything in her hand.

## 2016-07-15 NOTE — ED Notes (Signed)
PT states understanding of care given, follow up care, and medication prescribed. PT ambulated from ED to car with a steady gait. 

## 2016-07-30 ENCOUNTER — Emergency Department (HOSPITAL_COMMUNITY)
Admission: EM | Admit: 2016-07-30 | Discharge: 2016-07-30 | Disposition: A | Discharge status: Home / Self Care | Payer: Medicaid Other | Attending: Emergency Medicine | Admitting: Emergency Medicine

## 2016-07-30 ENCOUNTER — Encounter (HOSPITAL_COMMUNITY): Payer: Self-pay | Admitting: Emergency Medicine

## 2016-07-30 DIAGNOSIS — F17210 Nicotine dependence, cigarettes, uncomplicated: Secondary | ICD-10-CM | POA: Diagnosis not present

## 2016-07-30 DIAGNOSIS — R51 Headache: Secondary | ICD-10-CM | POA: Diagnosis present

## 2016-07-30 DIAGNOSIS — Z79899 Other long term (current) drug therapy: Secondary | ICD-10-CM | POA: Diagnosis not present

## 2016-07-30 DIAGNOSIS — G43009 Migraine without aura, not intractable, without status migrainosus: Secondary | ICD-10-CM | POA: Diagnosis not present

## 2016-07-30 DIAGNOSIS — F909 Attention-deficit hyperactivity disorder, unspecified type: Secondary | ICD-10-CM | POA: Diagnosis not present

## 2016-07-30 DIAGNOSIS — Z91040 Latex allergy status: Secondary | ICD-10-CM | POA: Diagnosis not present

## 2016-07-30 MED ORDER — KETOROLAC TROMETHAMINE 30 MG/ML IJ SOLN
30.0000 mg | Freq: Once | INTRAMUSCULAR | Status: AC
  Administered 2016-07-30: 30 mg via INTRAVENOUS
  Filled 2016-07-30: qty 1

## 2016-07-30 MED ORDER — SODIUM CHLORIDE 0.9 % IV SOLN
Freq: Once | INTRAVENOUS | Status: AC
  Administered 2016-07-30: 11:00:00 via INTRAVENOUS

## 2016-07-30 MED ORDER — DIPHENHYDRAMINE HCL 50 MG/ML IJ SOLN
INTRAMUSCULAR | Status: AC
  Administered 2016-07-30: 12.5 mg via INTRAVENOUS
  Filled 2016-07-30: qty 1

## 2016-07-30 MED ORDER — DIPHENHYDRAMINE HCL 50 MG/ML IJ SOLN
12.5000 mg | Freq: Once | INTRAMUSCULAR | Status: AC
  Administered 2016-07-30: 12.5 mg via INTRAVENOUS

## 2016-07-30 MED ORDER — DIPHENHYDRAMINE HCL 50 MG/ML IJ SOLN
25.0000 mg | Freq: Once | INTRAMUSCULAR | Status: AC
  Administered 2016-07-30: 25 mg via INTRAVENOUS
  Filled 2016-07-30: qty 1

## 2016-07-30 MED ORDER — METOCLOPRAMIDE HCL 5 MG/ML IJ SOLN
10.0000 mg | Freq: Once | INTRAMUSCULAR | Status: AC
  Administered 2016-07-30: 10 mg via INTRAVENOUS
  Filled 2016-07-30: qty 2

## 2016-07-30 NOTE — Discharge Instructions (Signed)
Return if any problems.

## 2016-07-30 NOTE — ED Notes (Signed)
Pt had out of skin feeling after meds. PA informed see orders

## 2016-07-30 NOTE — ED Provider Notes (Signed)
[facility] Provider Note   CSN: [id number] Arrival date & time: 07/30/16  0847     History   Chief Complaint Chief Complaint  Patient presents with  . Headache    HPI Teresa [name] is a 26 y.o. female.  The history is provided by the patient. No language interpreter was used.  Headache   This is a new problem. The current episode started yesterday. The problem occurs constantly. The problem has been gradually worsening. The headache is associated with nothing. The pain is moderate. The pain does not radiate. Associated symptoms include nausea. Pertinent negatives include no vomiting. She has tried nothing for the symptoms. The treatment provided moderate relief.   Pt complains of a migraine headahe Past Medical History:  Diagnosis Date  . Abnormal Pap smear   . ADHD (attention deficit hyperactivity disorder)   . Asthma    childhood /grew out of it  . Chlamydia   . Degenerative disk disease   . Depression    post partem  . Fractured fibula    left  . History of PCOS   . Trichomonas   . Urinary tract infection   . Vaginal Pap smear, abnormal    normal since    Patient Active Problem List   Diagnosis Date Noted  . DUB (dysfunctional uterine bleeding) 06/12/2015  . Breast mass, right 01/11/2014  . Dysmenorrhea 04/15/2012    Past Surgical History:  Procedure Laterality Date  . DILATION AND CURETTAGE OF UTERUS    . INDUCED ABORTION    . WISDOM TOOTH EXTRACTION      OB History    Gravida Para Term Preterm AB Living   0 2 1   SAB TAB Ectopic Multiple Live Births   1 1 0 0 1       Home Medications    Prior to Admission medications   Medication Sig Start Date End Date Taking? Authorizing Provider  cyclobenzaprine (FLEXERIL) 10 MG tablet Take 1 tablet (10 mg total) by mouth 2 (two) times daily as needed for muscle spasms. Patient not taking: Reported on 07/30/2016 04/04/16   Arthor Captain, PA-C  ibuprofen (ADVIL,MOTRIN) 800 MG tablet Take 1  tablet (800 mg total) by mouth every 8 (eight) hours as needed for cramping. Patient not taking: Reported on 07/30/2016 06/24/15   Dorathy Kinsman, CNM  methocarbamol (ROBAXIN) 500 MG tablet Take 1 tablet (500 mg total) by mouth 2 (two) times daily. Patient not taking: Reported on 07/30/2016 07/15/16   Tatyana Kirichenko, PA-C  metroNIDAZOLE (FLAGYL) 500 MG tablet Take 1 tablet (500 mg total) by mouth 2 (two) times daily. Patient not taking: Reported on 04/04/2016 03/14/16   Adam Phenix, MD  predniSONE (DELTASONE) 20 MG tablet Take 2 tablets (40 mg total) by mouth daily. Patient not taking: Reported on 07/30/2016 07/15/16   Jaynie Crumble, PA-C    Family History Family History  Problem Relation Age of Onset  . Diabetes Mother   . Hypertension Mother   . Heart disease Mother   . Diabetes Maternal Grandfather   . Heart disease Maternal Grandmother     chf  . Anesthesia problems Neg Hx   . Hypotension Neg Hx   . Malignant hyperthermia Neg Hx   . Pseudochol deficiency Neg Hx     Social History Social History  Substance Use Topics  . Smoking status: Current Every Day Smoker    Packs/day: 0.50    Years: 7.00    Types: Cigarettes  .  Smokeless tobacco: Never Used  . Alcohol use 0.6 oz/week    1 Shots of liquor per week     Comment: occ but was a heavy user in 2015 due to boyfriend passed away.     Allergies   Other; Pineapple; Betadine [povidone iodine]; Latex; Tramadol; and Tomato   Review of Systems Review of Systems  Gastrointestinal: Positive for nausea. Negative for vomiting.  Neurological: Positive for headaches.  All other systems reviewed and are negative.    Physical Exam Updated Vital Signs BP 123/81   Pulse (!) 54   Temp 98.3 F (36.8 C) (Oral)   Resp 15   SpO2 98%   Physical Exam  Constitutional: She is oriented to [place], place, and time. She appears well-developed and well-nourished.  HENT:  Head: Normocephalic.  Right Ear: External ear normal.  Left  Ear: External ear normal.  Eyes: EOM are normal. Pupils are equal, round, and reactive to light.  Neck: Normal range of motion.  Cardiovascular: Normal rate and regular rhythm.   Pulmonary/Chest: Effort normal.  Abdominal: Soft. Bowel sounds are normal. She exhibits no distension.  Musculoskeletal: Normal range of motion.  Neurological: She is alert and oriented to [place], place, and time.  Skin: Skin is warm. Capillary refill takes less than 2 seconds.  Psychiatric: She has a normal mood and affect.  Nursing note and vitals reviewed.    ED Treatments / Results  Labs (all labs ordered are listed, but only abnormal results are displayed) Labs Reviewed - No data to display  EKG  EKG Interpretation None       Radiology No results found.  Procedures Procedures (including critical care time)  Medications Ordered in ED Medications  0.9 %  sodium chloride infusion ( Intravenous New Bag/Given 07/30/16 1039)  ketorolac (TORADOL) 30 MG/ML injection 30 mg (30 mg Intravenous Given 07/30/16 1041)  diphenhydrAMINE (BENADRYL) injection 25 mg (25 mg Intravenous Given 07/30/16 1039)  metoCLOPramide (REGLAN) injection 10 mg (10 mg Intravenous Given 07/30/16 1042)  diphenhydrAMINE (BENADRYL) injection 12.5 mg (12.5 mg Intravenous Given 07/30/16 1104)     Initial Impression / Assessment and Plan / ED Course  I have reviewed the triage vital signs and the nursing notes.  Pertinent labs & imaging results that were available during my care of the patient were reviewed by me and considered in my medical decision making (see chart for details).     Pt given migraine cocktail, Pt reports she feels better,    Final Clinical Impressions(s) / ED Diagnoses   Final diagnoses:  Migraine without aura and without status migrainosus, not intractable    New Prescriptions New Prescriptions   No medications on file  An After Visit Summary was printed and given to the patient.   Lonia Skinner [place],  PA-C 07/30/16 1305    Pricilla Loveless, MD 08/01/16 [phone]

## 2016-07-30 NOTE — ED Triage Notes (Signed)
Pt sts HA on top of head and photophobia upon waking this am; pt sts hx of same in past

## 2016-08-21 ENCOUNTER — Encounter (HOSPITAL_COMMUNITY): Payer: Self-pay | Admitting: Emergency Medicine

## 2016-08-21 ENCOUNTER — Ambulatory Visit (HOSPITAL_COMMUNITY)
Admission: EM | Admit: 2016-08-21 | Discharge: 2016-08-21 | Disposition: A | Discharge status: Home / Self Care | Payer: Medicaid Other | Attending: Family Medicine | Admitting: Family Medicine

## 2016-08-21 DIAGNOSIS — M79601 Pain in right arm: Secondary | ICD-10-CM | POA: Diagnosis not present

## 2016-08-21 DIAGNOSIS — M25511 Pain in right shoulder: Secondary | ICD-10-CM | POA: Diagnosis not present

## 2016-08-21 DIAGNOSIS — M791 Myalgia: Secondary | ICD-10-CM | POA: Diagnosis not present

## 2016-08-21 DIAGNOSIS — M7918 Myalgia, other site: Secondary | ICD-10-CM

## 2016-08-21 MED ORDER — CYCLOBENZAPRINE HCL 10 MG PO TABS: 10.0000 mg | ORAL_TABLET | Freq: Two times a day (BID) | ORAL | 0 refills | Status: DC | PRN

## 2016-08-21 MED ORDER — DICLOFENAC SODIUM 75 MG PO TBEC: 75.0000 mg | DELAYED_RELEASE_TABLET | Freq: Two times a day (BID) | ORAL | 0 refills | Status: DC
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
## 2016-08-28 ENCOUNTER — Emergency Department (HOSPITAL_COMMUNITY): Payer: No Typology Code available for payment source

## 2016-08-28 ENCOUNTER — Encounter (HOSPITAL_COMMUNITY): Payer: Self-pay

## 2016-08-28 ENCOUNTER — Emergency Department (HOSPITAL_COMMUNITY)
Admission: EM | Admit: 2016-08-28 | Discharge: 2016-08-28 | Disposition: A | Discharge status: Home / Self Care | Payer: No Typology Code available for payment source | Attending: Emergency Medicine | Admitting: Emergency Medicine

## 2016-08-28 DIAGNOSIS — Z91040 Latex allergy status: Secondary | ICD-10-CM | POA: Diagnosis not present

## 2016-08-28 DIAGNOSIS — J45909 Unspecified asthma, uncomplicated: Secondary | ICD-10-CM | POA: Diagnosis not present

## 2016-08-28 DIAGNOSIS — Y999 Unspecified external cause status: Secondary | ICD-10-CM | POA: Diagnosis not present

## 2016-08-28 DIAGNOSIS — F909 Attention-deficit hyperactivity disorder, unspecified type: Secondary | ICD-10-CM | POA: Insufficient documentation

## 2016-08-28 DIAGNOSIS — Y939 Activity, unspecified: Secondary | ICD-10-CM | POA: Insufficient documentation

## 2016-08-28 DIAGNOSIS — M5442 Lumbago with sciatica, left side: Secondary | ICD-10-CM | POA: Insufficient documentation

## 2016-08-28 DIAGNOSIS — Z79899 Other long term (current) drug therapy: Secondary | ICD-10-CM | POA: Diagnosis not present

## 2016-08-28 DIAGNOSIS — M5441 Lumbago with sciatica, right side: Secondary | ICD-10-CM | POA: Diagnosis not present

## 2016-08-28 DIAGNOSIS — S3992XA Unspecified injury of lower back, initial encounter: Secondary | ICD-10-CM | POA: Diagnosis present

## 2016-08-28 DIAGNOSIS — F17210 Nicotine dependence, cigarettes, uncomplicated: Secondary | ICD-10-CM | POA: Diagnosis not present

## 2016-08-28 DIAGNOSIS — Y92410 Unspecified street and highway as the place of occurrence of the external cause: Secondary | ICD-10-CM | POA: Insufficient documentation

## 2016-08-28 LAB — POC URINE PREG, ED: PREG TEST UR: NEGATIVE

## 2016-08-28 LAB — URINALYSIS, ROUTINE W REFLEX MICROSCOPIC
Bilirubin Urine: NEGATIVE
Glucose, UA: NEGATIVE mg/dL
HGB URINE DIPSTICK: NEGATIVE
Ketones, ur: NEGATIVE mg/dL
Leukocytes, UA: NEGATIVE
NITRITE: NEGATIVE
Protein, ur: NEGATIVE mg/dL
SPECIFIC GRAVITY, URINE: 1.02 (ref 1.005–1.030)
pH: 7 (ref 5.0–8.0)

## 2016-08-28 MED ORDER — KETOROLAC TROMETHAMINE 30 MG/ML IJ SOLN
30.0000 mg | Freq: Once | INTRAMUSCULAR | Status: AC
  Administered 2016-08-28: 30 mg via INTRAMUSCULAR
  Filled 2016-08-28: qty 1

## 2016-08-28 MED ORDER — HYDROCODONE-ACETAMINOPHEN 5-325 MG PO TABS: 1.0000 | ORAL_TABLET | ORAL | 0 refills | Status: DC | PRN

## 2016-08-28 MED ORDER — METHYLPREDNISOLONE 4 MG PO TBPK: ORAL_TABLET | ORAL | 0 refills | Status: DC

## 2016-08-28 NOTE — ED Notes (Signed)
Patient transported to X-ray 

## 2016-08-28 NOTE — ED Notes (Signed)
Pt ambulated to restroom to collect urine sample.
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
## 2016-08-28 NOTE — Discharge Planning (Signed)
Pt up for discharge. EDCM reviewed chart for possible CM needs.  No needs identified or communicated.  
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
## 2016-08-28 NOTE — Discharge Instructions (Signed)
X-ray showed no fracture. This is likely a strain of your lower lumbar region. Have getting short course of pain medicine. Take the steroid pack as prescribed. Make sure you're doing warm compresses. Back exercises as directed. Have given you follow with orthopedic surgeon. Return to the ED if you develop any loss of bowel or bladder, he unable to urinate, fevers, numbness in her groin or for any reason.

## 2016-09-02 ENCOUNTER — Other Ambulatory Visit: Payer: Self-pay | Admitting: Obstetrics & Gynecology

## 2016-09-02 DIAGNOSIS — N938 Other specified abnormal uterine and vaginal bleeding: Secondary | ICD-10-CM

## 2016-09-02 DIAGNOSIS — N946 Dysmenorrhea, unspecified: Secondary | ICD-10-CM

## 2016-09-24 ENCOUNTER — Emergency Department (HOSPITAL_COMMUNITY)
Admission: EM | Admit: 2016-09-24 | Discharge: 2016-09-24 | Disposition: A | Discharge status: Home / Self Care | Payer: No Typology Code available for payment source

## 2016-09-24 NOTE — ED Notes (Signed)
Called pt back to triage x2 with no response

## 2017-03-27 ENCOUNTER — Encounter (HOSPITAL_COMMUNITY): Payer: Self-pay | Admitting: Emergency Medicine

## 2017-03-27 ENCOUNTER — Other Ambulatory Visit: Payer: Self-pay

## 2017-03-27 ENCOUNTER — Emergency Department (HOSPITAL_COMMUNITY)
Admission: EM | Admit: 2017-03-27 | Discharge: 2017-03-27 | Disposition: A | Discharge status: Home / Self Care | Payer: Medicaid Other | Attending: Emergency Medicine | Admitting: Emergency Medicine

## 2017-03-27 DIAGNOSIS — J45909 Unspecified asthma, uncomplicated: Secondary | ICD-10-CM | POA: Diagnosis not present

## 2017-03-27 DIAGNOSIS — R112 Nausea with vomiting, unspecified: Secondary | ICD-10-CM | POA: Diagnosis not present

## 2017-03-27 DIAGNOSIS — Z91040 Latex allergy status: Secondary | ICD-10-CM | POA: Insufficient documentation

## 2017-03-27 DIAGNOSIS — F17210 Nicotine dependence, cigarettes, uncomplicated: Secondary | ICD-10-CM | POA: Diagnosis not present

## 2017-03-27 DIAGNOSIS — Z202 Contact with and (suspected) exposure to infections with a predominantly sexual mode of transmission: Secondary | ICD-10-CM | POA: Diagnosis not present

## 2017-03-27 DIAGNOSIS — N898 Other specified noninflammatory disorders of vagina: Secondary | ICD-10-CM | POA: Diagnosis not present

## 2017-03-27 DIAGNOSIS — Z79899 Other long term (current) drug therapy: Secondary | ICD-10-CM | POA: Insufficient documentation

## 2017-03-27 DIAGNOSIS — J029 Acute pharyngitis, unspecified: Secondary | ICD-10-CM | POA: Diagnosis present

## 2017-03-27 LAB — URINALYSIS, ROUTINE W REFLEX MICROSCOPIC
Bilirubin Urine: NEGATIVE
GLUCOSE, UA: NEGATIVE mg/dL
HGB URINE DIPSTICK: NEGATIVE
Ketones, ur: NEGATIVE mg/dL
Leukocytes, UA: NEGATIVE
Nitrite: NEGATIVE
PH: 5 (ref 5.0–8.0)
PROTEIN: NEGATIVE mg/dL
Specific Gravity, Urine: 1.026 (ref 1.005–1.030)

## 2017-03-27 LAB — RAPID STREP SCREEN (MED CTR MEBANE ONLY): Streptococcus, Group A Screen (Direct): NEGATIVE

## 2017-03-27 LAB — WET PREP, GENITAL
CLUE CELLS WET PREP: NONE SEEN
SPERM: NONE SEEN
TRICH WET PREP: NONE SEEN
Yeast Wet Prep HPF POC: NONE SEEN

## 2017-03-27 LAB — PREGNANCY, URINE: Preg Test, Ur: NEGATIVE

## 2017-03-27 MED ORDER — SODIUM CHLORIDE 0.9 % IV BOLUS (SEPSIS)
1000.0000 mL | Freq: Once | INTRAVENOUS | Status: AC
  Administered 2017-03-27: 1000 mL via INTRAVENOUS

## 2017-03-27 MED ORDER — ONDANSETRON HCL 4 MG/2ML IJ SOLN
4.0000 mg | Freq: Once | INTRAMUSCULAR | Status: AC
  Administered 2017-03-27: 4 mg via INTRAVENOUS
  Filled 2017-03-27: qty 2

## 2017-03-27 MED ORDER — CEFTRIAXONE SODIUM 250 MG IJ SOLR
250.0000 mg | Freq: Once | INTRAMUSCULAR | Status: AC
  Administered 2017-03-27: 250 mg via INTRAMUSCULAR
  Filled 2017-03-27: qty 250

## 2017-03-27 MED ORDER — FLUCONAZOLE 150 MG PO TABS: 150.0000 mg | ORAL_TABLET | Freq: Every day | ORAL | 0 refills | Status: AC

## 2017-03-27 MED ORDER — ONDANSETRON 8 MG PO TBDP: 8.0000 mg | ORAL_TABLET | Freq: Three times a day (TID) | ORAL | 0 refills | Status: DC | PRN

## 2017-03-27 MED ORDER — LIDOCAINE HCL (PF) 1 % IJ SOLN
INTRAMUSCULAR | Status: AC
  Filled 2017-03-27: qty 2

## 2017-03-27 MED ORDER — AZITHROMYCIN 250 MG PO TABS
1000.0000 mg | ORAL_TABLET | Freq: Once | ORAL | Status: AC
  Administered 2017-03-27: 1000 mg via ORAL
  Filled 2017-03-27: qty 4

## 2017-03-27 NOTE — ED Triage Notes (Signed)
N/V since 0300

## 2017-03-27 NOTE — Discharge Instructions (Addendum)
So far your labs are negative including your strep test and your vaginal test, although remember that the gonorrhea and Chlamydia tests are pending and should resolve within the next 2 days. You have been treated for these 2 infections with antibiotics you received here this evening.  Please get rechecked for any worsening or persistent symptoms.  You will be notified of either culture is positive.  Remember that your partner will need to be treated if either of these cultures is positive.  Do not participate in sex until your symptoms are gone.  You may take the diflucan if you develop vaginal discharge or itching from todays antibiotic treatment.

## 2017-03-27 NOTE — ED Provider Notes (Signed)
[facility] EMERGENCY DEPARTMENT Provider Note   CSN: [id number] Arrival date & time: 03/27/17  1636     History   Chief Complaint Chief Complaint  Patient presents with  . Influenza    HPI Teresa [name] is a 26 y.o. female presenting with a 2-week history of mild sore throat along with vaginal irritation and clear discharge, but today developed flulike symptoms which included subjective fever, nausea with emesis, worsening sore throat and generalized body aches.  She is concerned for possible STD, stating she has discovered her boyfriend has cheated on her, is concerned for possible vaginal and pharyngeal exposure as she participated in oral sex and has had a mild sore throat which worsened today.  She denies abdominal pain, dysuria, back or flank pain.  Additionally she denies rash or lesions.  She has had no treatments prior to arrival.  She denies any known exposure to strep throat or influenza.  To her knowledge her boyfriend is asymptomatic.  The history is provided by the patient.    Past Medical History:  Diagnosis Date  . Abnormal Pap smear   . ADHD (attention deficit hyperactivity disorder)   . Asthma    childhood /grew out of it  . Chlamydia   . Degenerative disk disease   . Depression    post partem  . Fractured fibula    left  . History of PCOS   . Trichomonas   . Urinary tract infection   . Vaginal Pap smear, abnormal    normal since    Patient Active Problem List   Diagnosis Date Noted  . DUB (dysfunctional uterine bleeding) 06/12/2015  . Breast mass, right 01/11/2014  . Dysmenorrhea 04/15/2012    Past Surgical History:  Procedure Laterality Date  . DILATION AND CURETTAGE OF UTERUS    . INDUCED ABORTION    . WISDOM TOOTH EXTRACTION      OB History    Gravida Para Term Preterm AB Living   3 1 1  0 2 1   SAB TAB Ectopic Multiple Live Births   1 1 0 0 1       Home Medications    Prior to Admission medications   Medication Sig Start  Date End Date Taking? Authorizing Provider  TRI-LO-SPRINTEC 0.18/0.215/0.25 MG-25 MCG tab TAKE 1 TABLET BY MOUTH DAILY. 09/05/16  Yes Adam PhenixArnold, James G, MD  ondansetron (ZOFRAN ODT) 8 MG disintegrating tablet Take 1 tablet (8 mg total) by mouth every 8 (eight) hours as needed for nausea or vomiting. 03/27/17   Burgess AmorIdol, Kortney Schoenfelder, PA-C    Family History Family History  Problem Relation Age of Onset  . Diabetes Mother   . Hypertension Mother   . Heart disease Mother   . Diabetes Maternal Grandfather   . Heart disease Maternal Grandmother        chf  . Anesthesia problems Neg Hx   . Hypotension Neg Hx   . Malignant hyperthermia Neg Hx   . Pseudochol deficiency Neg Hx     Social History Social History   Tobacco Use  . Smoking status: Current Every Day Smoker    Packs/day: 0.50    Years: 7.00    Pack years: 3.50    Types: Cigarettes  . Smokeless tobacco: Never Used  Substance Use Topics  . Alcohol use: Yes    Alcohol/week: 0.6 oz    Types: 1 Shots of liquor per week    Comment: occ but was a heavy user in 2015 due to  boyfriend passed away.  . Drug use: No    Comment: Last use today, last use was April 2016     Allergies   Other; Pineapple; Betadine [povidone iodine]; Latex; Tramadol; and Tomato   Review of Systems Review of Systems  Constitutional: Positive for fever.  HENT: Positive for sore throat. Negative for congestion.   Eyes: Negative.   Respiratory: Negative for chest tightness and shortness of breath.   Cardiovascular: Negative for chest pain.  Gastrointestinal: Positive for nausea and vomiting. Negative for abdominal pain.  Genitourinary: Positive for vaginal discharge. Negative for dysuria, pelvic pain and vaginal pain.  Musculoskeletal: Positive for myalgias. Negative for arthralgias, joint swelling and neck pain.  Skin: Negative.  Negative for rash and wound.  Neurological: Negative for dizziness, weakness, light-headedness, numbness and headaches.    Psychiatric/Behavioral: Negative.      Physical Exam Updated Vital Signs BP 128/71 (BP Location: Right Arm)   Pulse (!) 104   Temp 98.9 F (37.2 C) (Oral)   Resp 18   LMP 03/13/2017   SpO2 97%   Physical Exam  Constitutional: She is oriented to [place], place, and time. She appears well-developed and well-nourished.  HENT:  Head: Normocephalic and atraumatic.  Right Ear: Tympanic membrane and ear canal normal.  Left Ear: Tympanic membrane and ear canal normal.  Nose: Mucosal edema and rhinorrhea present.  Mouth/Throat: Uvula is midline and mucous membranes are normal. Posterior oropharyngeal erythema present. No oropharyngeal exudate, posterior oropharyngeal edema or tonsillar abscesses. No tonsillar exudate.  Eyes: Conjunctivae are normal.  Cardiovascular: Normal rate and normal heart sounds.  Pulmonary/Chest: Effort normal. No respiratory distress. She has no wheezes. She has no rales.  Abdominal: Soft. There is no tenderness. There is no rigidity, no guarding and no CVA tenderness.  Genitourinary: Uterus normal. Uterus is not tender. Cervix exhibits motion tenderness. Cervix exhibits no discharge. Right adnexum displays no mass, no tenderness and no fullness. Left adnexum displays no mass, no tenderness and no fullness. Vaginal discharge found.  Genitourinary Comments: Thin, watery white vaginal discharge.  No exam findings to suggest cervicitis.  Musculoskeletal: Normal range of motion.  Neurological: She is alert and oriented to [place], place, and time.  Skin: Skin is warm and dry. No rash noted.  Psychiatric: She has a normal mood and affect.     ED Treatments / Results  Labs (all labs ordered are listed, but only abnormal results are displayed) Labs Reviewed  WET PREP, GENITAL - Abnormal; Notable for the following components:      Result Value   WBC, Wet Prep HPF POC FEW (*)    All other components within normal limits  RAPID STREP SCREEN (NOT AT [facility])  CULTURE,  GROUP A STREP ([facility])  URINALYSIS, ROUTINE W REFLEX MICROSCOPIC  PREGNANCY, URINE  GC/CHLAMYDIA PROBE AMP ([place]) NOT AT [facility]  GC/CHLAMYDIA PROBE AMP () NOT AT [facility]    EKG  EKG Interpretation None       Radiology No results found.  Procedures Procedures (including critical care time)  Medications Ordered in ED Medications  lidocaine (PF) (XYLOCAINE) 1 % injection (not administered)  sodium chloride 0.9 % bolus 1,000 mL (1,000 mLs Intravenous New Bag/Given 03/27/17 1756)  ondansetron (ZOFRAN) injection 4 mg (4 mg Intravenous Given 03/27/17 1756)  cefTRIAXone (ROCEPHIN) injection 250 mg (250 mg Intramuscular Given 03/27/17 1925)  azithromycin (ZITHROMAX) tablet 1,000 mg (1,000 mg Oral Given 03/27/17 1925)     Initial Impression / Assessment and Plan / ED Course  I have reviewed the triage vital signs and the nursing notes.  Pertinent labs & imaging results that were available during my care of the patient were reviewed by me and considered in my medical decision making (see chart for details).     Labs reviewed and discussed with patient.  She was given Zofran and IV fluids while here, no further nausea or vomiting in the department.  Discussed prophylactic treatment for possible GC chlamydia, although cultures are currently pending.  Patient chooses to be treated for these possible infections.  She was given Rocephin and Zithromax.  She will be prescribed Zofran for as needed home use for any persistent nausea.  She was encouraged to follow-up with her PCP for any worsened or persistent symptoms.  Discussed that her partner will need treatment if her STD cultures are positive.  Patient has no acute abdominal findings on exam.  The patient appears reasonably screened and/or stabilized for discharge and I doubt any other medical condition or other [facility] requiring further screening, evaluation, or treatment in the ED at this time prior to discharge.  At discharge,  pt asked for diflucan tx as abx always causes yeast infection.  Prescribed 150 mg x 1.   Final Clinical Impressions(s) / ED Diagnoses   Final diagnoses:  Nausea and vomiting, intractability of vomiting not specified, unspecified vomiting type  Vaginal discharge  Potential exposure to STD    ED Discharge Orders        Ordered    ondansetron (ZOFRAN ODT) 8 MG disintegrating tablet  Every 8 hours PRN     03/27/17 1951       Burgess Amor, PA-C 03/27/17 1954    Burgess Amor, PA-C 03/27/17 2000    Loren Racer, MD 04/08/17 1212

## 2017-03-28 LAB — GC/CHLAMYDIA PROBE AMP (~~LOC~~) NOT AT ARMC
CHLAMYDIA, DNA PROBE: NEGATIVE
NEISSERIA GONORRHEA: NEGATIVE

## 2017-03-29 LAB — CULTURE, GROUP A STREP (THRC)

## 2017-03-30 ENCOUNTER — Telehealth: Payer: Self-pay

## 2017-03-30 NOTE — Progress Notes (Signed)
ED Antimicrobial Stewardship Positive Culture Follow Up   Teresa [name] is an 26 y.o. female who [facility] with a chief complaint of  Chief Complaint  Patient presents with  . Influenza    Recent Results (from the past 720 hour(s))  Rapid strep screen     Status: None   Collection Time: 03/27/17  5:32 PM  Result Value Ref Range Status   Streptococcus, Group A Screen (Direct) NEGATIVE NEGATIVE Final    Comment: (NOTE) A Rapid Antigen test may result negative if the antigen level in the sample is below the detection level of this test. The FDA has not cleared this test as a stand-alone test therefore the rapid antigen negative result has reflexed to a Group A Strep culture.   Wet prep, genital     Status: Abnormal   Collection Time: 03/27/17  5:32 PM  Result Value Ref Range Status   Yeast Wet Prep HPF POC NONE SEEN NONE SEEN Final   Trich, Wet Prep NONE SEEN NONE SEEN Final   Clue Cells Wet Prep HPF POC NONE SEEN NONE SEEN Final   WBC, Wet Prep HPF POC FEW (A) NONE SEEN Final   Sperm NONE SEEN  Final  Culture, group A strep     Status: None   Collection Time: 03/27/17  5:32 PM  Result Value Ref Range Status   Specimen Description THROAT  Final   Special Requests NONE Reflexed from [id number]  Final   Culture FEW GROUP A STREP (S.PYOGENES) ISOLATED  Final   Report Status 03/29/2017 FINAL  Final    [x]  Patient discharged originally without antimicrobial agent and treatment is now indicated  New antibiotic prescription: Amoxicillin 500mg  PO BID x 10 days  ED Provider: Graciella FreerLindsey Layden PA-C   Armandina StammerBATCHELDER,Odalys Win J 03/30/2017, 9:18 AM Infectious Diseases Pharmacist Phone# [phone]

## 2017-03-30 NOTE — Telephone Encounter (Signed)
Post ED Visit - Positive Culture Follow-up: Successful Patient Follow-Up  Culture assessed and recommendations reviewed by: [x]  Enzo BiNathan Batchelder, Pharm.D. []  Celedonio MiyamotoJeremy Frens, [address]Ph[address] BCPS AQ-ID []  Garvin FilaMike Maccia, Pharm.D., BCPS []  Georgina PillionElizabeth Martin, Pharm.D., BCPS []  Windsor HeightsMinh Pham, [address]Pharm[address]D., BCPS, AAHIVP []  Estella HuskMichelle Turner, Pharm.D., BCPS, AAHIVP []  Lysle Pearlachel Rumbarger, PharmD, BCPS []  Casilda Carlsaylor Stone, PharmD, BCPS []  Pollyann SamplesAndy Johnston, PharmD, BCPS  Positive strep culture  []  Patient discharged without antimicrobial prescription and treatment is now indicated []  Organism is resistant to prescribed ED discharge antimicrobial []  Patient with positive blood cultures  Changes discussed with ED provider: Graciella FreerLindsey Layden PA=C New antibiotic prescription amoxicillin 500mg  PO BID x 10 days [phone]  Contacted patient, date 03/30/17, time 0947   Jerry CarasCullom, Shelden Raborn Burnett 03/30/2017, 9:44 AM

## 2017-07-25 ENCOUNTER — Inpatient Hospital Stay (HOSPITAL_COMMUNITY)
Admission: AD | Admit: 2017-07-25 | Discharge: 2017-07-25 | Disposition: A | Discharge status: Home / Self Care | Payer: Medicaid Other | Source: Ambulatory Visit | Attending: Obstetrics and Gynecology | Admitting: Obstetrics and Gynecology

## 2017-07-25 DIAGNOSIS — B9689 Other specified bacterial agents as the cause of diseases classified elsewhere: Secondary | ICD-10-CM | POA: Diagnosis not present

## 2017-07-25 DIAGNOSIS — F17210 Nicotine dependence, cigarettes, uncomplicated: Secondary | ICD-10-CM | POA: Insufficient documentation

## 2017-07-25 DIAGNOSIS — N760 Acute vaginitis: Secondary | ICD-10-CM | POA: Diagnosis not present

## 2017-07-25 DIAGNOSIS — N898 Other specified noninflammatory disorders of vagina: Secondary | ICD-10-CM | POA: Diagnosis present

## 2017-07-25 LAB — WET PREP, GENITAL
Sperm: NONE SEEN
Trich, Wet Prep: NONE SEEN
Yeast Wet Prep HPF POC: NONE SEEN

## 2017-07-25 LAB — URINALYSIS, ROUTINE W REFLEX MICROSCOPIC
BILIRUBIN URINE: NEGATIVE
Bacteria, UA: NONE SEEN
GLUCOSE, UA: NEGATIVE mg/dL
Ketones, ur: NEGATIVE mg/dL
NITRITE: NEGATIVE
Protein, ur: 100 mg/dL — AB
SPECIFIC GRAVITY, URINE: 1.042 — AB (ref 1.005–1.030)
pH: 5 (ref 5.0–8.0)

## 2017-07-25 MED ORDER — METRONIDAZOLE 500 MG PO TABS: 500.0000 mg | ORAL_TABLET | Freq: Two times a day (BID) | ORAL | 0 refills | Status: DC

## 2017-07-25 NOTE — MAU Note (Signed)
C/O vaginal itching, malodorous discharge.  Some burning when she urinates.  Currently on her period.

## 2017-07-25 NOTE — Discharge Instructions (Signed)
Bacterial Vaginosis °Bacterial vaginosis is an infection of the vagina. It happens when too many germs (bacteria) grow in the vagina. This infection puts you at risk for infections from sex (STIs). Treating this infection can lower your risk for some STIs. You should also treat this if you are pregnant. It can cause your baby to be born early. °Follow these instructions at home: °Medicines °· Take over-the-counter and prescription medicines only as told by your doctor. °· Take or use your antibiotic medicine as told by your doctor. Do not stop taking or using it even if you start to feel better. °General instructions °· If you your sexual partner is a woman, tell her that you have this infection. She needs to get treatment if she has symptoms. If you have a female partner, he does not need to be treated. °· During treatment: °? Avoid sex. °? Do not douche. °? Avoid alcohol as told. °? Avoid breastfeeding as told. °· Drink enough fluid to keep your pee (urine) clear or pale yellow. °· Keep your vagina and butt (rectum) clean. °? Wash the area with warm water every day. °? Wipe from front to back after you use the toilet. °· Keep all follow-up visits as told by your doctor. This is important. °Preventing this condition °· Do not douche. °· Use only warm water to wash around your vagina. °· Use protection when you have sex. This includes: °? Latex condoms. °? Dental dams. °· Limit how many people you have sex with. It is best to only have sex with the same [place] (be monogamous). °· Get tested for STIs. Have your partner get tested. °· Wear underwear that is cotton or lined with cotton. °· Avoid tight pants and pantyhose. This is most important in summer. °· Do not use any products that have nicotine or tobacco in them. These include cigarettes and e-cigarettes. If you need help quitting, ask your doctor. °· Do not use illegal drugs. °· Limit how much alcohol you drink. °Contact a doctor if: °· Your symptoms do not get  better, even after you are treated. °· You have more discharge or pain when you pee (urinate). °· You have a fever. °· You have pain in your belly (abdomen). °· You have pain with sex. °· Your bleed from your vagina between periods. °Summary °· This infection happens when too many germs (bacteria) grow in the vagina. °· Treating this condition can lower your risk for some infections from sex (STIs). °· You should also treat this if you are pregnant. It can cause early (premature) birth. °· Do not stop taking or using your antibiotic medicine even if you start to feel better. °This information is not intended to replace advice given to you by your health care provider. Make sure you discuss any questions you have with your health care provider. °Document Released: 01/23/2008 Document Revised: 12/30/2015 Document Reviewed: 12/30/2015 °Elsevier Interactive Patient Education © 2017 Elsevier Inc. ° °In late 2019, the [facility] will be moving to the [place] campus. At that time, the MAU (Maternity Admissions Unit), where you are being seen today, will no longer take care of non-pregnant patients. We strongly encourage you to find a doctor's office before that time, so that you can be seen with any GYN concerns, like vaginal discharge, urinary tract infection, etc.. in a timely manner. ° °In order to make an office visit more convenient, the [facility] will be offering evening hours with same-day   appointments, walk-in appointments and scheduled appointments available during this time. ° °[facility] Hours: °Monday - 8am - 7:30 pm with walk-in between 4pm- 7:30 pm °Tuesday - 8 am - 5 pm (starting 07/29/17 we will be open late and accepting walk-ins from 4pm - 7:30pm) °Wednesday - 8 am - 5 pm (starting 10/29/17 we will be open late and accepting walk-ins from 4pm - 7:30pm) °Thursday 8 am - 5 pm (starting 01/29/18 we will be open late and  accepting walk-ins from 4pm - 7:30pm) °Friday 8 am - 5 pm ° °For an appointment please call the [facility] at [phone] ° °For urgent needs, [facility] is also available for management of urgent GYN complaints such as vaginal discharge or urinary tract infections. ° ° ° ° ° °

## 2017-07-25 NOTE — MAU Provider Note (Signed)
History     CSN: [id number]  Arrival date and time: 07/25/17 2120   First Provider Initiated Contact with Patient 07/25/17 2214     Chief Complaint  Patient presents with  . Vaginal Discharge  . Vaginal Itching   HPI Teresa [name] is a 27 y.o. [id number] non pregnant female who presents with vaginal discharge with an odor. She states it has been ongoing for the last 3 days. She denies any bleeding. She denies any pain. She reports feeling some burning with urination.   OB History    Gravida  3   Para  1   Term  1   Preterm  0   AB  2   Living  1     SAB  1   TAB  1   Ectopic  0   Multiple  0   Live Births  1           Past Medical History:  Diagnosis Date  . Abnormal Pap smear   . ADHD (attention deficit hyperactivity disorder)   . Asthma    childhood /grew out of it  . Chlamydia   . Degenerative disk disease   . Depression    post partem  . Fractured fibula    left  . History of PCOS   . Trichomonas   . Urinary tract infection   . Vaginal Pap smear, abnormal    normal since    Past Surgical History:  Procedure Laterality Date  . DILATION AND CURETTAGE OF UTERUS    . INDUCED ABORTION    . WISDOM TOOTH EXTRACTION      Family History  Problem Relation Age of Onset  . Diabetes Mother   . Hypertension Mother   . Heart disease Mother   . Diabetes Maternal Grandfather   . Heart disease Maternal Grandmother        chf  . Anesthesia problems Neg Hx   . Hypotension Neg Hx   . Malignant hyperthermia Neg Hx   . Pseudochol deficiency Neg Hx     Social History   Tobacco Use  . Smoking status: Current Every Day Smoker    Packs/day: 0.50    Years: 7.00    Pack years: 3.50    Types: Cigarettes  . Smokeless tobacco: Never Used  Substance Use Topics  . Alcohol use: Yes    Alcohol/week: 0.6 oz    Types: 1 Shots of liquor per week    Comment: occ but was a heavy user in 2015 due to boyfriend passed away.  . Drug use: No    Types:  Marijuana    Comment: Last use today, last use was April 2016    Allergies:  Allergies  Allergen Reactions  . Other Anaphylaxis    Grapes, strawberries, bananas, pineapple, oranges, nuts  . Pineapple Anaphylaxis and Swelling  . Betadine [Povidone Iodine] Itching    Betadine causes itching and skin redness and irritation  . Latex Itching  . Tramadol Nausea And Vomiting  . Tomato Itching and Rash    Medications Prior to Admission  Medication Sig Dispense Refill Last Dose  . ondansetron (ZOFRAN ODT) 8 MG disintegrating tablet Take 1 tablet (8 mg total) by mouth every 8 (eight) hours as needed for nausea or vomiting. 20 tablet 0   . TRI-LO-SPRINTEC 0.18/0.215/0.25 MG-25 MCG tab TAKE 1 TABLET BY MOUTH DAILY. 28 tablet 0 03/27/2017 at Unknown time    Review of Systems  Constitutional: Negative.  Negative for  fatigue and fever.  HENT: Negative.   Respiratory: Negative.  Negative for shortness of breath.   Cardiovascular: Negative.  Negative for chest pain.  Gastrointestinal: Negative.  Negative for abdominal pain, constipation, diarrhea, nausea and vomiting.  Genitourinary: Positive for vaginal discharge. Negative for dysuria and vaginal bleeding.  Neurological: Negative.  Negative for dizziness and headaches.   Physical Exam   Blood pressure 140/84, pulse (!) 108, temperature 98.7 F (37.1 C), temperature source Oral, resp. rate 19, height 5\' 2"  (1.575 m), weight 286 lb 5 oz (129.9 kg), SpO2 100 %.  Physical Exam  Nursing note and vitals reviewed. Constitutional: She is oriented to [place], place, and time. She appears well-developed and well-nourished. No distress.  HENT:  Head: Normocephalic.  Eyes: Pupils are equal, round, and reactive to light.  Cardiovascular: Normal rate, regular rhythm and normal heart sounds.  Respiratory: Effort normal and breath sounds normal. No respiratory distress.  GI: Soft. Bowel sounds are normal. She exhibits no distension. There is no  tenderness.  Neurological: She is alert and oriented to [place], place, and time.  Skin: Skin is warm and dry.  Psychiatric: She has a normal mood and affect. Her behavior is normal. Judgment and thought content normal.    MAU Course  Procedures Results for orders placed or performed during the hospital encounter of 07/25/17 (from the past 24 hour(s))  Wet prep, genital     Status: Abnormal   Collection Time: 07/25/17  9:35 PM  Result Value Ref Range   Yeast Wet Prep HPF POC NONE SEEN NONE SEEN   Trich, Wet Prep NONE SEEN NONE SEEN   Clue Cells Wet Prep HPF POC PRESENT (A) NONE SEEN   WBC, Wet Prep HPF POC FEW (A) NONE SEEN   Sperm NONE SEEN   Urinalysis, Routine w reflex microscopic     Status: Abnormal   Collection Time: 07/25/17  9:35 PM  Result Value Ref Range   Color, Urine AMBER (A) YELLOW   APPearance HAZY (A) CLEAR   Specific Gravity, Urine 1.042 (H) 1.005 - 1.030   pH 5.0 5.0 - 8.0   Glucose, UA NEGATIVE NEGATIVE mg/dL   Hgb urine dipstick LARGE (A) NEGATIVE   Bilirubin Urine NEGATIVE NEGATIVE   Ketones, ur NEGATIVE NEGATIVE mg/dL   Protein, ur 161 (A) NEGATIVE mg/dL   Nitrite NEGATIVE NEGATIVE   Leukocytes, UA SMALL (A) NEGATIVE   RBC / HPF TOO NUMEROUS TO COUNT 0 - 5 RBC/hpf   WBC, UA 6-30 0 - 5 WBC/hpf   Bacteria, UA NONE SEEN NONE SEEN   Squamous Epithelial / LPF 0-5 (A) NONE SEEN   Mucus PRESENT    MDM UA Wet prep and gc/chlamydia  Assessment and Plan   1. Bacterial vaginosis    -Discharge home in stable condition -Rx for metronidazole sent to patient's pharmacy -Alcohol precautions discussed -Patient advised to follow-up with gyn of choice for routine gyn needs.  -Patient may return to MAU as needed or if her condition were to change or worsen  Rolm Bookbinder CNM 07/25/2017, 10:14 PM

## 2017-07-27 LAB — URINE CULTURE

## 2017-07-28 LAB — GC/CHLAMYDIA PROBE AMP (~~LOC~~) NOT AT ARMC
Chlamydia: NEGATIVE
Neisseria Gonorrhea: NEGATIVE

## 2017-10-08 ENCOUNTER — Ambulatory Visit: Payer: Medicaid Other | Admitting: Obstetrics & Gynecology

## 2017-10-27 ENCOUNTER — Other Ambulatory Visit: Payer: Self-pay

## 2017-10-27 ENCOUNTER — Encounter (HOSPITAL_COMMUNITY): Payer: Self-pay | Admitting: *Deleted

## 2017-10-27 ENCOUNTER — Emergency Department (HOSPITAL_COMMUNITY)
Admission: EM | Admit: 2017-10-27 | Discharge: 2017-10-27 | Disposition: A | Discharge status: Home / Self Care | Payer: Medicaid Other | Attending: Emergency Medicine | Admitting: Emergency Medicine

## 2017-10-27 ENCOUNTER — Emergency Department (HOSPITAL_COMMUNITY): Payer: Medicaid Other

## 2017-10-27 DIAGNOSIS — F17210 Nicotine dependence, cigarettes, uncomplicated: Secondary | ICD-10-CM | POA: Insufficient documentation

## 2017-10-27 DIAGNOSIS — Y999 Unspecified external cause status: Secondary | ICD-10-CM | POA: Insufficient documentation

## 2017-10-27 DIAGNOSIS — Z79899 Other long term (current) drug therapy: Secondary | ICD-10-CM | POA: Insufficient documentation

## 2017-10-27 DIAGNOSIS — Y9389 Activity, other specified: Secondary | ICD-10-CM | POA: Insufficient documentation

## 2017-10-27 DIAGNOSIS — W2209XA Striking against other stationary object, initial encounter: Secondary | ICD-10-CM | POA: Diagnosis not present

## 2017-10-27 DIAGNOSIS — Y92009 Unspecified place in unspecified non-institutional (private) residence as the place of occurrence of the external cause: Secondary | ICD-10-CM | POA: Diagnosis not present

## 2017-10-27 DIAGNOSIS — S0990XA Unspecified injury of head, initial encounter: Secondary | ICD-10-CM | POA: Insufficient documentation

## 2017-10-27 HISTORY — DX: Migraine, unspecified, not intractable, without status migrainosus: G43.909

## 2017-10-27 LAB — POC URINE PREG, ED: Preg Test, Ur: NEGATIVE

## 2017-10-27 MED ORDER — DIPHENHYDRAMINE HCL 50 MG/ML IJ SOLN: 25.0000 mg | Freq: Once | INTRAMUSCULAR | Status: DC

## 2017-10-27 MED ORDER — KETOROLAC TROMETHAMINE 30 MG/ML IJ SOLN
15.0000 mg | Freq: Once | INTRAMUSCULAR | Status: AC
  Administered 2017-10-27: 15 mg via INTRAMUSCULAR
  Filled 2017-10-27: qty 1

## 2017-10-27 MED ORDER — METOCLOPRAMIDE HCL 5 MG/ML IJ SOLN: 10.0000 mg | Freq: Once | INTRAMUSCULAR | Status: DC

## 2017-10-27 MED ORDER — ACETAMINOPHEN 500 MG PO TABS
1000.0000 mg | ORAL_TABLET | Freq: Once | ORAL | Status: AC
  Administered 2017-10-27: 1000 mg via ORAL
  Filled 2017-10-27: qty 2

## 2017-10-27 NOTE — ED Notes (Signed)
Patient transported to CT 

## 2017-10-27 NOTE — ED Triage Notes (Signed)
PT states she smacked her head on a cabinet on Friday.  No loc.  Since then she is drowsy, nauseated and photophobic.  Difficult to assess pupils b/c pt would not keep L eye open, stating light was too painful.

## 2017-10-27 NOTE — ED Provider Notes (Signed)
[facility] Provider Note   CSN: [id number] Arrival date & time: 10/27/17  1558   History   Chief Complaint Chief Complaint  Patient presents with  . Head Injury    HPI Teresa [name] is a 27 y.o. female.  HPI  Patient is a 27 year old female with history of asthma and migraines presenting to the ED for closed head injury. She states that she bent over 3 days ago to plug something in when she struck her forehead against an open. She had immediate onset of frontal headache which worsened today. Pain is now moderate and diffuse, and is similar to prior migraines. Associated with photophobia. No vomiting, changes in vision, weakness, or numbness. No neck pain or fever. No other recent illness or injury.  Past Medical History:  Diagnosis Date  . Abnormal Pap smear   . ADHD (attention deficit hyperactivity disorder)   . Asthma    childhood /grew out of it  . Chlamydia   . Degenerative disk disease   . Depression    post partem  . Fractured fibula    left  . History of PCOS   . Migraine   . Trichomonas   . Urinary tract infection   . Vaginal Pap smear, abnormal    normal since    Patient Active Problem List   Diagnosis Date Noted  . DUB (dysfunctional uterine bleeding) 06/12/2015  . Breast mass, right 01/11/2014  . Dysmenorrhea 04/15/2012    Past Surgical History:  Procedure Laterality Date  . DILATION AND CURETTAGE OF UTERUS    . INDUCED ABORTION    . WISDOM TOOTH EXTRACTION       OB History    Gravida  3   Para  1   Term  1   Preterm  0   AB  2   Living  1     SAB  1   TAB  1   Ectopic  0   Multiple  0   Live Births  1            Home Medications    Prior to Admission medications   Medication Sig Start Date End Date Taking? Authorizing Provider  metroNIDAZOLE (FLAGYL) 500 MG tablet Take 1 tablet (500 mg total) by mouth 2 (two) times daily. 07/25/17   Rolm BookbinderNeill, Caroline M, CNM  ondansetron (ZOFRAN  ODT) 8 MG disintegrating tablet Take 1 tablet (8 mg total) by mouth every 8 (eight) hours as needed for nausea or vomiting. 03/27/17   Idol, Raynelle FanningJulie, PA-C  TRI-LO-SPRINTEC 0.18/0.215/0.25 MG-25 MCG tab TAKE 1 TABLET BY MOUTH DAILY. 09/05/16   Adam PhenixArnold, James G, MD    Family History Family History  Problem Relation Age of Onset  . Diabetes Mother   . Hypertension Mother   . Heart disease Mother   . Diabetes Maternal Grandfather   . Heart disease Maternal Grandmother        chf  . Anesthesia problems Neg Hx   . Hypotension Neg Hx   . Malignant hyperthermia Neg Hx   . Pseudochol deficiency Neg Hx     Social History Social History   Tobacco Use  . Smoking status: Current Every Day Smoker    Packs/day: 0.50    Years: 7.00    Pack years: 3.50    Types: Cigarettes  . Smokeless tobacco: Never Used  Substance Use Topics  . Alcohol use: Yes    Alcohol/week: 0.6 oz    Types: 1  Shots of liquor per week    Comment: occ but was a heavy user in 2015 due to boyfriend passed away.  . Drug use: No    Types: Marijuana    Comment: Last use today, last use was April 2016     Allergies   Other; Pineapple; Betadine [povidone iodine]; Latex; Tramadol; and Tomato   Review of Systems Review of Systems  Constitutional: Negative for fever.  HENT: Negative for congestion and sore throat.   Eyes: Negative for visual disturbance.  Respiratory: Negative for shortness of breath.   Cardiovascular: Negative for chest pain.  Gastrointestinal: Positive for nausea. Negative for abdominal pain, diarrhea and vomiting.  Genitourinary: Negative for dysuria.  Musculoskeletal: Negative for neck pain.  Skin: Negative for wound.  Neurological: Positive for headaches. Negative for syncope, speech difficulty, weakness and numbness.  All other systems reviewed and are negative.    Physical Exam Updated Vital Signs BP 124/72 (BP Location: Right Arm)   Pulse 96   Temp 98.4 F (36.9 C) (Oral)   Resp 16    Ht 5\' 2"  (1.575 m)   Wt 134.3 kg (296 lb)   SpO2 100%   BMI 54.14 kg/m   Physical Exam  Constitutional: She is oriented to [place], place, and time. No distress.  HENT:  Head: Normocephalic.  Mouth/Throat: Oropharynx is clear and moist.  Mild tenderness to palpation of the left forehead. No deformity of the skull or facial bones.  Eyes: Pupils are equal, round, and reactive to light. Conjunctivae and EOM are normal.  Neck: Neck supple. No spinous process tenderness and no muscular tenderness present. No tracheal deviation and normal range of motion present.  Cardiovascular: Normal rate, regular rhythm, normal heart sounds and intact distal pulses.  No murmur heard. Pulmonary/Chest: Effort normal and breath sounds normal. No stridor. No respiratory distress. She has no wheezes. She has no rales.  Abdominal: Soft. She exhibits no distension and no mass. There is no tenderness. There is no guarding.  Musculoskeletal: She exhibits no edema or deformity.  Neurological: She is alert and oriented to [place], place, and time.  Speech is fluent. CN II through XII intact. 5/5 strength in all extremities with intact sensation to light touch. No dysmetria on finger-to-nose.  Skin: Skin is warm and dry.  Psychiatric: She has a normal mood and affect. Her behavior is normal.  Nursing note and vitals reviewed.    ED Treatments / Results  Labs (all labs ordered are listed, but only abnormal results are displayed) Labs Reviewed  POC URINE PREG, ED    EKG None  Radiology Ct Head Wo Contrast  Result Date: 10/27/2017 CLINICAL DATA:  27 year old female with acute headache following injury 3 days ago. EXAM: CT HEAD WITHOUT CONTRAST TECHNIQUE: Contiguous axial images were obtained from the base of the skull through the vertex without intravenous contrast. COMPARISON:  11/09/2014 CT FINDINGS: Brain: No evidence of acute infarction, hemorrhage, hydrocephalus, extra-axial collection or mass lesion/mass  effect. Vascular: No hyperdense vessel or unexpected calcification. Skull: Normal. Negative for fracture or focal lesion. Sinuses/Orbits: No acute finding. RIGHT sphenoid sinus mucosal thickening identified. Other: None. IMPRESSION: No evidence of intracranial abnormality. Electronically Signed   By: Harmon Pier M.D.   On: 10/27/2017 18:52    Procedures Procedures (including critical care time)  Medications Ordered in ED Medications  acetaminophen (TYLENOL) tablet 1,000 mg (1,000 mg Oral Given 10/27/17 1804)  ketorolac (TORADOL) 30 MG/ML injection 15 mg (15 mg Intramuscular Given 10/27/17 1946)  Initial Impression / Assessment and Plan / ED Course  I have reviewed the triage vital signs and the nursing notes.  Pertinent labs & imaging results that were available during my care of the patient were reviewed by me and considered in my medical decision making (see chart for details).  Patient is a 27 year old female with history of asthma and migraines presenting to the ED for headache after head injury 3 days ago as above. Exam reassuring and significant head trauma. CT head shows no acute intracranial abnormality. Neurologic exam is normal and she appears comfortable. Clinical picture most consistent with mild concussion. No signs of alternative headache etiology such as CNS infection or elevated ICP. Offered migraine cocktail but she is driving home. Patient therefore given Toradol. Will discharge home.  Patient informed of all ED findings. Return precautions and follow up plan reviewed. All questions answered.   Final Clinical Impressions(s) / ED Diagnoses   Final diagnoses:  Minor head injury, initial encounter     Cecille Po, MD 10/28/17 1610    Marily Memos, MD 10/28/17 1538

## 2017-10-27 NOTE — ED Provider Notes (Signed)
Patient placed in Quick Look pathway, seen and evaluated   Chief Complaint: headache  HPI:   Patient states that 3 days ago she accidentally hit her head when she was attempting to plug in a fan at work. He denies loss of consciousness at that time. Since then she has had waxing and waning left-sided headache, nausea, generalized fatigue. One hour prior to arrival patient states that she had sudden onset worsening of her headache. She states that it is severe, throbbing, radiates all over the left side of the skull. Notes associated photophobia. She states that she became diaphoretic and lightheaded but denies syncope. Denies numbness, tingling, or weakness.  ROS: positive for headache, photophobia, nausea Negative for fever, syncope, numbness, weakness  Physical Exam:   Gen: No distress  Psych: normal affect and behavior  Skin: Warm    Focused Exam: fluent speech with no evidence of dysarthria or aphasia. No facial droop. Sensation intact to soft touch of extremities and face. Cranial nerves II through XII tested and intact. Pupils are equal and reactive to light. She has diffuse tenderness to palpation of the left side of the skull with no underlying crepitus or deformity. 5/5 strength of PUD and BLE major muscle groups. Ambulatory without difficulty, able to heel walk and toe walk without difficulty. Provided coherent history, able to follow 2 step commands. Normal finger-to-nose of the bilateral upper extremities.   Initiation of care has begun. The patient has been counseled on the process, plan, and necessity for staying for the completion/evaluation, and the remainder of the medical screening examination     Bennye AlmFawze, Broox Lonigro A, PA-C 10/27/17 1652    Mesner, Barbara CowerJason, MD 10/27/17 2142

## 2017-10-27 NOTE — ED Notes (Signed)
Discharge instructions discussed with Pt. Pt verbalized understanding. Pt stable and ambulatory.    

## 2017-11-21 ENCOUNTER — Ambulatory Visit (INDEPENDENT_AMBULATORY_CARE_PROVIDER_SITE_OTHER): Payer: Medicaid Other | Admitting: Obstetrics & Gynecology

## 2017-11-21 ENCOUNTER — Encounter: Payer: Self-pay | Admitting: Obstetrics & Gynecology

## 2017-11-21 ENCOUNTER — Other Ambulatory Visit (HOSPITAL_COMMUNITY)
Admission: RE | Admit: 2017-11-21 | Discharge: 2017-11-21 | Disposition: A | Discharge status: Home / Self Care | Payer: Medicaid Other | Source: Ambulatory Visit | Attending: Obstetrics & Gynecology | Admitting: Obstetrics & Gynecology

## 2017-11-21 VITALS — BP 136/94 | HR 105 | Ht 62.0 in | Wt 288.0 lb

## 2017-11-21 DIAGNOSIS — Z0000 Encounter for general adult medical examination without abnormal findings: Secondary | ICD-10-CM | POA: Diagnosis not present

## 2017-11-21 DIAGNOSIS — F17210 Nicotine dependence, cigarettes, uncomplicated: Secondary | ICD-10-CM

## 2017-11-21 DIAGNOSIS — Z01419 Encounter for gynecological examination (general) (routine) without abnormal findings: Secondary | ICD-10-CM | POA: Insufficient documentation

## 2017-11-21 DIAGNOSIS — N938 Other specified abnormal uterine and vaginal bleeding: Secondary | ICD-10-CM

## 2017-11-21 DIAGNOSIS — N946 Dysmenorrhea, unspecified: Secondary | ICD-10-CM

## 2017-11-21 MED ORDER — DROSPIRENONE-ETHINYL ESTRADIOL 3-0.03 MG PO TABS: 1.0000 | ORAL_TABLET | Freq: Every day | ORAL | 11 refills | Status: DC

## 2017-11-21 NOTE — Patient Instructions (Addendum)
Oral Contraception Information Oral contraceptive pills (OCPs) are medicines taken to prevent pregnancy. OCPs work by preventing the ovaries from releasing eggs. The hormones in OCPs also cause the cervical mucus to thicken, preventing the sperm from entering the uterus. The hormones also cause the uterine lining to become thin, not allowing a fertilized egg to attach to the inside of the uterus. OCPs are highly effective when taken exactly as prescribed. However, OCPs do not prevent sexually transmitted diseases (STDs). Safe sex practices, such as using condoms along with the pill, can help prevent STDs. Before taking the pill, you may have a physical exam and Pap test. Your health care provider may order blood tests. The health care provider will make sure you are a good candidate for oral contraception. Discuss with your health care provider the possible side effects of the OCP you may be prescribed. When starting an OCP, it can take 2 to 3 months for the body to adjust to the changes in hormone levels in your body. Types of oral contraception  The combination pill-This pill contains estrogen and progestin (synthetic progesterone) hormones. The combination pill comes in 21-day, 28-day, or 91-day packs. Some types of combination pills are meant to be taken continuously (365-day pills). With 21-day packs, you do not take pills for 7 days after the last pill. With 28-day packs, the pill is taken every day. The last 7 pills are without hormones. Certain types of pills have more than 21 hormone-containing pills. With 91-day packs, the first 84 pills contain both hormones, and the last 7 pills contain no hormones or contain estrogen only.  The minipill-This pill contains the progesterone hormone only. The pill is taken every day continuously. It is very important to take the pill at the same time each day. The minipill comes in packs of 28 pills. All 28 pills contain the hormone. Advantages of oral  contraceptive pills  Decreases premenstrual symptoms.  Treats menstrual period cramps.  Regulates the menstrual cycle.  Decreases a heavy menstrual flow.  May treatacne, depending on the type of pill.  Treats abnormal uterine bleeding.  Treats polycystic ovarian syndrome.  Treats endometriosis.  Can be used as emergency contraception. Things that can make oral contraceptive pills less effective OCPs can be less effective if:  You forget to take the pill at the same time every day.  You have a stomach or intestinal disease that lessens the absorption of the pill.  You take OCPs with other medicines that make OCPs less effective, such as antibiotics, certain HIV medicines, and some seizure medicines.  You take expired OCPs.  You forget to restart the pill on day 7, when using the packs of 21 pills.  Risks associated with oral contraceptive pills Oral contraceptive pills can sometimes cause side effects, such as:  Headache.  Nausea.  Breast tenderness.  Irregular bleeding or spotting.  Combination pills are also associated with a small increased risk of:  Blood clots.  Heart attack.  Stroke.  This information is not intended to replace advice given to you by your health care provider. Make sure you discuss any questions you have with your health care provider. Document Released: 07/06/2002 Document Revised: 09/21/2015 Document Reviewed: 10/04/2012 Elsevier Interactive Patient Education  2018 [employer]  Coping with Quitting Smoking Quitting smoking is a physical and mental challenge. You will face cravings, withdrawal symptoms, and temptation. Before quitting, work with your health care provider to make a plan that can help you cope. Preparation can help you  quit and keep you from giving in. How can I cope with cravings? Cravings usually last for 5-10 minutes. If you get through it, the craving will pass. Consider taking the following actions to help you  cope with cravings:  Keep your mouth busy: ? Chew sugar-free gum. ? Suck on hard candies or a straw. ? Brush your teeth.  Keep your hands and body busy: ? Immediately change to a different activity when you feel a craving. ? Squeeze or play with a ball. ? Do an activity or a hobby, like making bead jewelry, practicing needlepoint, or working with wood. ? Mix up your normal routine. ? Take a short exercise break. Go for a quick walk or run up and down stairs. ? Spend time in public places where smoking is not allowed.  Focus on doing something kind or helpful for someone else.  Call a friend or family member to talk during a craving.  Join a support group.  Call a quit line, such as 1-800-QUIT-NOW.  Talk with your health care provider about medicines that might help you cope with cravings and make quitting easier for you.  How can I deal with withdrawal symptoms? Your body may experience negative effects as it tries to get used to not having nicotine in the system. These effects are called withdrawal symptoms. They may include:  Feeling hungrier than normal.  Trouble concentrating.  Irritability.  Trouble sleeping.  Feeling depressed.  Restlessness and agitation.  Craving a cigarette.  To manage withdrawal symptoms:  Avoid places, people, and activities that trigger your cravings.  Remember why you want to quit.  Get plenty of sleep.  Avoid coffee and other caffeinated drinks. These may worsen some of your symptoms.  How can I handle social situations? Social situations can be difficult when you are quitting smoking, especially in the first few weeks. To manage this, you can:  Avoid parties, bars, and other social situations where people might be smoking.  Avoid alcohol.  Leave right away if you have the urge to smoke.  Explain to your family and friends that you are quitting smoking. Ask for understanding and support.  Plan activities with friends or  family where smoking is not an option.  What are some ways I can cope with stress? Wanting to smoke may cause stress, and stress can make you want to smoke. Find ways to manage your stress. Relaxation techniques can help. For example:  Breathe slowly and deeply, in through your nose and out through your mouth.  Listen to soothing, relaxing music.  Talk with a family member or friend about your stress.  Light a candle.  Soak in a bath or take a shower.  Think about a peaceful place.  What are some ways I can prevent weight gain? Be aware that many people gain weight after they quit smoking. However, not everyone does. To keep from gaining weight, have a plan in place before you quit and stick to the plan after you quit. Your plan should include:  Having healthy snacks. When you have a craving, it may help to: ? Eat plain popcorn, crunchy carrots, celery, or other cut vegetables. ? Chew sugar-free gum.  Changing how you eat: ? Eat small portion sizes at meals. ? Eat 4-6 small meals throughout the day instead of 1-2 large meals a day. ? Be mindful when you eat. Do not watch television or do other things that might distract you as you eat.  Exercising regularly: ? Make  time to exercise each day. If you do not have time for a long workout, do short bouts of exercise for 5-10 minutes several times a day. ? Do some form of strengthening exercise, like weight lifting, and some form of aerobic exercise, like running or swimming.  Drinking plenty of water or other low-calorie or no-calorie drinks. Drink 6-8 glasses of water daily, or as much as instructed by your health care provider.  Summary  Quitting smoking is a physical and mental challenge. You will face cravings, withdrawal symptoms, and temptation to smoke again. Preparation can help you as you go through these challenges.  You can cope with cravings by keeping your mouth busy (such as by chewing gum), keeping your body and hands  busy, and making calls to family, friends, or a helpline for people who want to quit smoking.  You can cope with withdrawal symptoms by avoiding places where people smoke, avoiding drinks with caffeine, and getting plenty of rest.  Ask your health care provider about the different ways to prevent weight gain, avoid stress, and handle social situations. This information is not intended to replace advice given to you by your health care provider. Make sure you discuss any questions you have with your health care provider. Document Released: 04/12/2016 Document Revised: 04/12/2016 Document Reviewed: 04/12/2016 Elsevier Interactive Patient Education  [employer].

## 2017-11-21 NOTE — Progress Notes (Signed)
Patient ID: Jone BasemanMyracle C Opheim, female   DOB: 1990/10/04, 27 y.o.   MRN: [id number]  Cc: needs annual exam and wishes to change OCP prescription  HPI Carmina C Babe is a 27 y.o. female.  [id number] No LMP recorded. (Menstrual status: Irregular Periods). She has regular menses on Tri Sprintec but gets nausea and breast tenderness. She requests a different prescription. HPI  Past Medical History:  Diagnosis Date  . Abnormal Pap smear   . ADHD (attention deficit hyperactivity disorder)   . Asthma    childhood /grew out of it  . Chlamydia   . Degenerative disk disease   . Depression    post partem  . Fractured fibula    left  . History of PCOS   . Migraine   . Trichomonas   . Urinary tract infection   . Vaginal Pap smear, abnormal    normal since    Past Surgical History:  Procedure Laterality Date  . DILATION AND CURETTAGE OF UTERUS    . INDUCED ABORTION    . WISDOM TOOTH EXTRACTION      Family History  Problem Relation Age of Onset  . Diabetes Mother   . Hypertension Mother   . Heart disease Mother   . Diabetes Maternal Grandfather   . Heart disease Maternal Grandmother        chf  . Anesthesia problems Neg Hx   . Hypotension Neg Hx   . Malignant hyperthermia Neg Hx   . Pseudochol deficiency Neg Hx     Social History Social History   Tobacco Use  . Smoking status: Current Every Day Smoker    Packs/day: 0.50    Years: 7.00    Pack years: 3.50    Types: Cigarettes  . Smokeless tobacco: Never Used  Substance Use Topics  . Alcohol use: Yes    Alcohol/week: 0.6 oz    Types: 1 Shots of liquor per week    Comment: occ but was a heavy user in 2015 due to boyfriend passed away.  . Drug use: No    Types: Marijuana    Comment: Last use today, last use was April 2016    Allergies  Allergen Reactions  . Other Anaphylaxis    Grapes, strawberries, bananas, pineapple, oranges, nuts  . Pineapple Anaphylaxis and Swelling  . Betadine [Povidone Iodine] Itching   Betadine causes itching and skin redness and irritation  . Latex Itching  . Tramadol Nausea And Vomiting  . Tomato Itching and Rash    Current Outpatient Medications  Medication Sig Dispense Refill  . drospirenone-ethinyl estradiol (YASMIN 28) 3-0.03 MG tablet Take 1 tablet by mouth daily. 1 Package 11  . metroNIDAZOLE (FLAGYL) 500 MG tablet Take 1 tablet (500 mg total) by mouth 2 (two) times daily. (Patient not taking: Reported on 11/21/2017) 14 tablet 0  . ondansetron (ZOFRAN ODT) 8 MG disintegrating tablet Take 1 tablet (8 mg total) by mouth every 8 (eight) hours as needed for nausea or vomiting. (Patient not taking: Reported on 11/21/2017) 20 tablet 0  . TRI-LO-SPRINTEC 0.18/0.215/0.25 MG-25 MCG tab TAKE 1 TABLET BY MOUTH DAILY. (Patient not taking: Reported on 11/21/2017) 28 tablet 0   No current facility-administered medications for this visit.     Review of Systems Review of Systems  Constitutional: Negative.   Respiratory: Negative.   Gastrointestinal: Positive for nausea.  Endocrine: Negative.   Genitourinary: Negative.   Musculoskeletal: Negative.     Blood pressure (!) 136/94, pulse (!) 105, height 5\' 2"  (  1.575 m), weight 288 lb (130.6 kg).  Physical Exam Physical Exam  Constitutional: She appears well-developed. No distress.  Neck: Normal range of motion.  Cardiovascular: Normal rate.  Pulmonary/Chest: Effort normal. No respiratory distress.  Abdominal: Soft. She exhibits no distension. There is no tenderness.  Genitourinary: Vagina normal and uterus normal. No vaginal discharge found.  Genitourinary Comments: Pelvic exam: normal external genitalia, vulva, vagina, cervix, uterus and adnexa.   Vitals reviewed. Breasts: breasts appear normal, no suspicious masses, no skin or nipple changes or axillary nodes.   Data Reviewed Pap result 2015 normal  Assessment    Well woman exam New prescription for OCP due to side-effects  Smoker  Plan    Urged to quit  smoking, information given Yasmin sent to Rx Requests STD testing      Scheryl Darter 11/21/2017, 10:27 AM

## 2017-11-22 LAB — HIV ANTIBODY (ROUTINE TESTING W REFLEX): HIV Screen 4th Generation wRfx: NONREACTIVE

## 2017-11-25 LAB — CYTOLOGY - PAP
CHLAMYDIA, DNA PROBE: NEGATIVE
DIAGNOSIS: NEGATIVE
HPV (WINDOPATH): NOT DETECTED
Neisseria Gonorrhea: NEGATIVE
Trichomonas: NEGATIVE

## 2018-07-02 ENCOUNTER — Encounter: Payer: Self-pay | Admitting: Obstetrics and Gynecology

## 2018-07-02 ENCOUNTER — Ambulatory Visit (INDEPENDENT_AMBULATORY_CARE_PROVIDER_SITE_OTHER): Payer: Medicaid Other | Admitting: Obstetrics and Gynecology

## 2018-07-02 DIAGNOSIS — N3020 Other chronic cystitis without hematuria: Secondary | ICD-10-CM | POA: Insufficient documentation

## 2018-07-02 DIAGNOSIS — N946 Dysmenorrhea, unspecified: Secondary | ICD-10-CM

## 2018-07-02 DIAGNOSIS — N938 Other specified abnormal uterine and vaginal bleeding: Secondary | ICD-10-CM | POA: Diagnosis not present

## 2018-07-02 DIAGNOSIS — N912 Amenorrhea, unspecified: Secondary | ICD-10-CM | POA: Diagnosis present

## 2018-07-02 HISTORY — DX: Other chronic cystitis without hematuria: N30.20

## 2018-07-02 LAB — POCT PREGNANCY, URINE: PREG TEST UR: NEGATIVE

## 2018-07-02 MED ORDER — NITROFURANTOIN MONOHYD MACRO 100 MG PO CAPS: 100.0000 mg | ORAL_CAPSULE | Freq: Two times a day (BID) | ORAL | 0 refills | Status: DC

## 2018-07-02 MED ORDER — DROSPIRENONE-ETHINYL ESTRADIOL 3-0.03 MG PO TABS: 1.0000 | ORAL_TABLET | Freq: Every day | ORAL | 11 refills | Status: DC

## 2018-07-02 NOTE — Progress Notes (Signed)
Ms  Mudry presents with c/o lower abd/pelvic pain for the last several months. Pain is crampy in nature. Increased with IC.  Also pt has ran out of her OCP's. Last took in Dec. LMP near the end of Dec as well.  Last IC was 1 week ago.  PE AF VSS Lungs clear  Heart RRR Abd soft + BS obese GU Nl EGBUS no vaginal discharge cervix without lesion + bladder tenderness uterus small mobile no masses exam limited by pt habitus  UPT negative  A/P Amenorrhea        Chronic cystitis  I suspect that pt's pain is related to her bladder. Will treat accordingly. Pt desires to restart OCP's. Pt instructed to reframe from IC x 1 week, take home UPT and if negative start OCP's on Sunday. U/R/Back up method reviewed with pt. F/U in 2 months for reeval

## 2018-11-04 ENCOUNTER — Encounter (HOSPITAL_COMMUNITY): Payer: Self-pay

## 2018-11-04 ENCOUNTER — Other Ambulatory Visit: Payer: Self-pay

## 2018-11-04 ENCOUNTER — Ambulatory Visit (HOSPITAL_COMMUNITY)
Admission: EM | Admit: 2018-11-04 | Discharge: 2018-11-04 | Disposition: A | Discharge status: Home / Self Care | Payer: Medicaid Other | Attending: Internal Medicine | Admitting: Internal Medicine

## 2018-11-04 DIAGNOSIS — B9689 Other specified bacterial agents as the cause of diseases classified elsewhere: Secondary | ICD-10-CM | POA: Diagnosis present

## 2018-11-04 DIAGNOSIS — N760 Acute vaginitis: Secondary | ICD-10-CM | POA: Insufficient documentation

## 2018-11-04 LAB — POCT URINALYSIS DIP (DEVICE)
Bilirubin Urine: NEGATIVE
Glucose, UA: NEGATIVE mg/dL
Hgb urine dipstick: NEGATIVE
Ketones, ur: NEGATIVE mg/dL
Leukocytes,Ua: NEGATIVE
Nitrite: NEGATIVE
Protein, ur: NEGATIVE mg/dL
Specific Gravity, Urine: >=1.03 (ref 1.005–1.030)
Urobilinogen, UA: 0.2 mg/dL (ref 0.0–1.0)
pH: 6 (ref 5.0–8.0)

## 2018-11-04 MED ORDER — METRONIDAZOLE 500 MG PO TABS: 500.0000 mg | ORAL_TABLET | Freq: Two times a day (BID) | ORAL | 0 refills | Status: DC

## 2018-11-04 NOTE — ED Triage Notes (Signed)
Pt states she has a UTI and pressure when she voids. X 4 days.

## 2018-11-04 NOTE — Discharge Instructions (Signed)
Take the medication, metronidazole, as prescribed.  Do not have sexual intercourse x7 days.  Your other vaginal tests are pending; we will call you if you need additional treatment.  If the other tests are positive, your sexual partners will need to be treated.  Return here or follow-up with your primary care provider or in the emergency room if you develop worsening abdominal pain, vaginal discharge, difficulty with urination, vomiting, diarrhea, back pain, fever, chills, or other concerning symptoms.

## 2018-11-04 NOTE — ED Provider Notes (Signed)
[facility]    CSN: [id number] Arrival date & time: 11/04/18  6270      History   Chief Complaint Chief Complaint  Patient presents with  . Urinary Tract Infection    HPI Teresa [name] is a 28 y.o. female.   Patient presents today with lower abdominal pain and urinary urgency.  She states this feels like her previous episodes of bacterial vaginosis.  She denies vaginal discharge, dysuria, vomiting, diarrhea, back pain, fever, chills, shortness of breath.  She states her last sexual activity was approximately 1 month ago.  LMP: 3 weeks, on birth control pills.  The history is provided by the patient.    Past Medical History:  Diagnosis Date  . Abnormal Pap smear   . ADHD (attention deficit hyperactivity disorder)   . Asthma    childhood /grew out of it  . Chlamydia   . Degenerative disk disease   . Depression    post partem  . Fractured fibula    left  . History of PCOS   . Migraine   . Trichomonas   . Urinary tract infection   . Vaginal Pap smear, abnormal    normal since    Patient Active Problem List   Diagnosis Date Noted  . Amenorrhea 07/02/2018  . Chronic cystitis 07/02/2018  . DUB (dysfunctional uterine bleeding) 06/12/2015  . Breast mass, right 01/11/2014  . Dysmenorrhea 04/15/2012    Past Surgical History:  Procedure Laterality Date  . DILATION AND CURETTAGE OF UTERUS    . INDUCED ABORTION    . WISDOM TOOTH EXTRACTION      OB History    Gravida  3   Para  1   Term  1   Preterm  0   AB  2   Living  1     SAB  1   TAB  1   Ectopic  0   Multiple  0   Live Births  1            Home Medications    Prior to Admission medications   Medication Sig Start Date End Date Taking? Authorizing Provider  drospirenone-ethinyl estradiol (YASMIN 28) 3-0.03 MG tablet Take 1 tablet by mouth daily. 07/02/18   Chancy Milroy, MD  nitrofurantoin, macrocrystal-monohydrate, (MACROBID) 100 MG capsule Take 1 capsule (100 mg total)  by mouth 2 (two) times daily. Then 1 tablet qhs until gone 07/02/18   Chancy Milroy, MD    Family History Family History  Problem Relation Age of Onset  . Diabetes Mother   . Hypertension Mother   . Heart disease Mother   . Diabetes Maternal Grandfather   . Heart disease Maternal Grandmother        chf  . Anesthesia problems Neg Hx   . Hypotension Neg Hx   . Malignant hyperthermia Neg Hx   . Pseudochol deficiency Neg Hx     Social History Social History   Tobacco Use  . Smoking status: Current Every Day Smoker    Packs/day: 0.50    Years: 7.00    Pack years: 3.50    Types: Cigarettes  . Smokeless tobacco: Never Used  Substance Use Topics  . Alcohol use: Yes    Alcohol/week: 1.0 standard drinks    Types: 1 Shots of liquor per week    Comment: occ but was a heavy user in Aug 28, 2013 due to boyfriend passed away.  . Drug use: No    Types: Marijuana  Comment: Last use today, last use was April 2016     Allergies   Other, Pineapple, Betadine [povidone iodine], Latex, Tramadol, and Tomato   Review of Systems Review of Systems  Constitutional: Negative for chills and fever.  HENT: Negative for ear pain and sore throat.   Eyes: Negative for pain and visual disturbance.  Respiratory: Negative for cough and shortness of breath.   Cardiovascular: Negative for chest pain and palpitations.  Gastrointestinal: Positive for abdominal pain. Negative for vomiting.  Genitourinary: Positive for urgency. Negative for dysuria, flank pain, frequency, hematuria, vaginal discharge and vaginal pain.  Musculoskeletal: Negative for arthralgias and back pain.  Skin: Negative for color change and rash.  Neurological: Negative for seizures and syncope.  All other systems reviewed and are negative.    Physical Exam Triage Vital Signs ED Triage Vitals [11/04/18 0900]  Enc Vitals Group     BP 120/84     Pulse Rate 79     Resp 16     Temp 98 F (36.7 C)     Temp Source Oral     SpO2 100  %     Weight      Height      Head Circumference      Peak Flow      Pain Score      Pain Loc      Pain Edu?      Excl. in GC?    No data found.  Updated Vital Signs BP 120/84 (BP Location: Left Arm)   Pulse 79   Temp 98 F (36.7 C) (Oral)   Resp 16   SpO2 100%   Visual Acuity Right Eye Distance:   Left Eye Distance:   Bilateral Distance:    Right Eye Near:   Left Eye Near:    Bilateral Near:     Physical Exam Vitals signs and nursing note reviewed.  Constitutional:      General: She is not in acute distress.    Appearance: She is well-developed.  HENT:     Head: Normocephalic and atraumatic.  Eyes:     Conjunctiva/sclera: Conjunctivae normal.  Neck:     Musculoskeletal: Neck supple.  Cardiovascular:     Rate and Rhythm: Normal rate and regular rhythm.     Heart sounds: No murmur.  Pulmonary:     Effort: Pulmonary effort is normal. No respiratory distress.     Breath sounds: Normal breath sounds.  Abdominal:     General: Bowel sounds are normal.     Palpations: Abdomen is soft.     Tenderness: There is no abdominal tenderness. There is no right CVA tenderness, left CVA tenderness, guarding or rebound.  Skin:    General: Skin is warm and dry.  Neurological:     Mental Status: She is alert.      UC Treatments / Results  Labs (all labs ordered are listed, but only abnormal results are displayed) Labs Reviewed  POCT URINALYSIS DIP (DEVICE)    EKG   Radiology No results found.  Procedures Procedures (including critical care time)  Medications Ordered in UC Medications - No data to display  Initial Impression / Assessment and Plan / UC Course  I have reviewed the triage vital signs and the nursing notes.  Pertinent labs & imaging results that were available during my care of the patient were reviewed by me and considered in my medical decision making (see chart for details).   Bacterial vaginosis.  Treating today  with metronidazole.  Vaginal  swab sent for GC, chlamydia, trichomonas; discussed with patient that we will call her if she needs additional medications.  Directions given to refrain from sexual activity x7 days.  Discussed with patient that she should return here or follow-up with her primary care provider or go to the emergency department if she develops worsening abdominal pain, vaginal discharge, dysuria, vomiting, diarrhea, back pain, fever, chills, other concerning symptoms.   Final Clinical Impressions(s) / UC Diagnoses   Final diagnoses:  None   Discharge Instructions   None    ED Prescriptions    None     Controlled Substance Prescriptions [place] Controlled Substance Registry consulted? Not Applicable   Mickie Bailate, Cheick Suhr H, NP 11/04/18 [phone]

## 2018-11-06 ENCOUNTER — Telehealth (HOSPITAL_COMMUNITY): Payer: Self-pay | Admitting: Emergency Medicine

## 2018-11-06 LAB — CERVICOVAGINAL ANCILLARY ONLY
Chlamydia: NEGATIVE
Neisseria Gonorrhea: NEGATIVE
Trichomonas: POSITIVE — AB

## 2018-11-06 NOTE — Telephone Encounter (Signed)
Trichomonas is positive. Rx metronidazole was given at the urgent care visit. Pt needs education to please refrain from sexual intercourse for 7 days to give the medicine time to work. Sexual partners need to be notified and tested/treated. Condoms may reduce risk of reinfection. Recheck for further evaluation if symptoms are not improving.   Attempted to reach patient. No answer at this time. No voicemail set up.

## 2018-11-09 ENCOUNTER — Encounter (HOSPITAL_COMMUNITY): Payer: Self-pay | Admitting: Emergency Medicine

## 2018-11-09 ENCOUNTER — Other Ambulatory Visit: Payer: Self-pay

## 2018-11-09 ENCOUNTER — Emergency Department (HOSPITAL_COMMUNITY)
Admission: EM | Admit: 2018-11-09 | Discharge: 2018-11-09 | Disposition: A | Discharge status: Home / Self Care | Payer: Medicaid Other | Attending: Emergency Medicine | Admitting: Emergency Medicine

## 2018-11-09 ENCOUNTER — Emergency Department (HOSPITAL_COMMUNITY): Payer: Medicaid Other

## 2018-11-09 DIAGNOSIS — F17210 Nicotine dependence, cigarettes, uncomplicated: Secondary | ICD-10-CM | POA: Diagnosis not present

## 2018-11-09 DIAGNOSIS — Z79899 Other long term (current) drug therapy: Secondary | ICD-10-CM | POA: Insufficient documentation

## 2018-11-09 DIAGNOSIS — Z91040 Latex allergy status: Secondary | ICD-10-CM | POA: Diagnosis not present

## 2018-11-09 DIAGNOSIS — Z20828 Contact with and (suspected) exposure to other viral communicable diseases: Secondary | ICD-10-CM | POA: Insufficient documentation

## 2018-11-09 DIAGNOSIS — R05 Cough: Secondary | ICD-10-CM | POA: Diagnosis present

## 2018-11-09 DIAGNOSIS — J45909 Unspecified asthma, uncomplicated: Secondary | ICD-10-CM | POA: Insufficient documentation

## 2018-11-09 DIAGNOSIS — J069 Acute upper respiratory infection, unspecified: Secondary | ICD-10-CM | POA: Diagnosis not present

## 2018-11-09 MED ORDER — BENZONATATE 100 MG PO CAPS
200.0000 mg | ORAL_CAPSULE | Freq: Once | ORAL | Status: AC
  Administered 2018-11-09: 200 mg via ORAL
  Filled 2018-11-09: qty 2

## 2018-11-09 MED ORDER — BENZONATATE 100 MG PO CAPS: 200.0000 mg | ORAL_CAPSULE | Freq: Three times a day (TID) | ORAL | 0 refills | Status: DC | PRN

## 2018-11-09 NOTE — ED Triage Notes (Signed)
Pt reports bad cough, sore throat, and congestion for several days. No recent travel.

## 2018-11-09 NOTE — ED Notes (Signed)
Pt upset nobody has been back in her room. Was informed on arrival due to covid precautions and testing, staff would be limiting their exposure.

## 2018-11-09 NOTE — ED Provider Notes (Signed)
[facility] EMERGENCY DEPARTMENT Provider Note   CSN: [id number] Arrival date & time: 11/09/18  9675    History   Chief Complaint Chief Complaint  Patient presents with  . Sore Throat    HPI Teresa [name] is a 28 y.o. female with a 5 day history of uri type symptoms which includes nasal congestion with clear rhinorrhea,post nasal drip with a tickle sensation in her throat triggering non productive cough which does respond transiently to robitussion.    Symptoms do not include shortness of breath, chest pain,  Nausea, vomiting or diarrhea.  Additionally she denies fevers or chills.  She has a history of childhood asthma, possible some mild intermittent wheezing with this illness.  She also has environmental allergies and has not taken her claritin in the past 5 days.  She is a smoker, but cut back this week.  No known exposure to covid.      The history is provided by the patient.    Past Medical History:  Diagnosis Date  . Abnormal Pap smear   . ADHD (attention deficit hyperactivity disorder)   . Asthma    childhood /grew out of it  . Chlamydia   . Degenerative disk disease   . Depression    post partem  . Fractured fibula    left  . History of PCOS   . Migraine   . Trichomonas   . Urinary tract infection   . Vaginal Pap smear, abnormal    normal since    Patient Active Problem List   Diagnosis Date Noted  . Amenorrhea 07/02/2018  . Chronic cystitis 07/02/2018  . DUB (dysfunctional uterine bleeding) 06/12/2015  . Breast mass, right 01/11/2014  . Dysmenorrhea 04/15/2012    Past Surgical History:  Procedure Laterality Date  . DILATION AND CURETTAGE OF UTERUS    . INDUCED ABORTION    . WISDOM TOOTH EXTRACTION       OB History    Gravida  3   Para  1   Term  1   Preterm  0   AB  2   Living  1     SAB  1   TAB  1   Ectopic  0   Multiple  0   Live Births  1            Home Medications    Prior to Admission medications    Medication Sig Start Date End Date Taking? Authorizing Provider  benzonatate (TESSALON) 100 MG capsule Take 2 capsules (200 mg total) by mouth 3 (three) times daily as needed. 11/09/18   Evalee Jefferson, PA-C  drospirenone-ethinyl estradiol (YASMIN 28) 3-0.03 MG tablet Take 1 tablet by mouth daily. 07/02/18   Chancy Milroy, MD  metroNIDAZOLE (FLAGYL) 500 MG tablet Take 1 tablet (500 mg total) by mouth 2 (two) times daily. 11/04/18   Sharion Balloon, NP  nitrofurantoin, macrocrystal-monohydrate, (MACROBID) 100 MG capsule Take 1 capsule (100 mg total) by mouth 2 (two) times daily. Then 1 tablet qhs until gone 07/02/18   Chancy Milroy, MD    Family History Family History  Problem Relation Age of Onset  . Diabetes Mother   . Hypertension Mother   . Heart disease Mother   . Diabetes Maternal Grandfather   . Heart disease Maternal Grandmother        chf  . Anesthesia problems Neg Hx   . Hypotension Neg Hx   . Malignant hyperthermia Neg Hx   .  Pseudochol deficiency Neg Hx     Social History Social History   Tobacco Use  . Smoking status: Current Every Day Smoker    Packs/day: 0.50    Years: 7.00    Pack years: 3.50    Types: Cigarettes  . Smokeless tobacco: Never Used  Substance Use Topics  . Alcohol use: Yes    Alcohol/week: 1.0 standard drinks    Types: 1 Shots of liquor per week    Comment: occ but was a heavy user in 2015 due to boyfriend passed away.  . Drug use: No    Types: Marijuana    Comment: Last use today, last use was April 2016     Allergies   Other, Pineapple, Betadine [povidone iodine], Latex, Tramadol, and Tomato   Review of Systems Review of Systems  Constitutional: Negative for chills and fever.  HENT: Positive for congestion, rhinorrhea and sinus pressure. Negative for ear pain, sore throat, trouble swallowing and voice change.   Eyes: Negative for discharge.  Respiratory: Positive for cough and wheezing. Negative for shortness of breath and stridor.    Cardiovascular: Negative for chest pain.  Gastrointestinal: Negative for abdominal pain.  Genitourinary: Negative.      Physical Exam Updated Vital Signs BP 120/79 (BP Location: Left Arm)   Pulse 80   Temp 99.1 F (37.3 C) (Oral)   Resp 20   Ht 5\' 2"  (1.575 m)   Wt 136.1 kg   LMP 10/14/2018   SpO2 96%   BMI 54.87 kg/m   Physical Exam Constitutional:      Appearance: She is well-developed.  HENT:     Head: Normocephalic and atraumatic.     Right Ear: Tympanic membrane and ear canal normal.     Left Ear: Tympanic membrane and ear canal normal.     Nose: Mucosal edema, congestion and rhinorrhea present.     Mouth/Throat:     Mouth: Mucous membranes are moist. No oral lesions.     Pharynx: Oropharynx is clear. Uvula midline. No pharyngeal swelling, oropharyngeal exudate or posterior oropharyngeal erythema.     Tonsils: No tonsillar exudate or tonsillar abscesses.  Eyes:     Conjunctiva/sclera: Conjunctivae normal.  Cardiovascular:     Rate and Rhythm: Normal rate.     Heart sounds: Normal heart sounds.  Pulmonary:     Effort: Pulmonary effort is normal. No respiratory distress.     Breath sounds: No wheezing or rales.     Comments: Reduced breath sounds throughout, somewhat reduced effort.  No accessory muscle use, no wheeze or stridor. Abdominal:     Palpations: Abdomen is soft.     Tenderness: There is no abdominal tenderness.  Musculoskeletal: Normal range of motion.  Skin:    General: Skin is warm and dry.     Findings: No rash.  Neurological:     Mental Status: She is alert and oriented to [place], place, and time.      ED Treatments / Results  Labs (all labs ordered are listed, but only abnormal results are displayed) Labs Reviewed  NOVEL CORONAVIRUS, NAA (HOSPITAL ORDER, SEND-OUT TO REF LAB)    EKG None  Radiology Dg Chest Portable 1 View  Result Date: 11/09/2018 CLINICAL DATA:  Cough, sore throat and congestion for several days. EXAM: PORTABLE  CHEST 1 VIEW COMPARISON:  January 10, 2012 FINDINGS: Cardiomediastinal silhouette is normal. Mediastinal contours appear intact. There is no evidence of focal airspace consolidation, pleural effusion or pneumothorax. Osseous structures are without acute  abnormality. Soft tissues are grossly normal. IMPRESSION: No active disease. Electronically Signed   By: Ted Mcalpineobrinka  Dimitrova M.D.   On: 11/09/2018 09:41    Procedures Procedures (including critical care time)  Medications Ordered in ED Medications  benzonatate (TESSALON) capsule 200 mg (has no administration in time range)     Initial Impression / Assessment and Plan / ED Course  I have reviewed the triage vital signs and the nursing notes.  Pertinent labs & imaging results that were available during my care of the patient were reviewed by me and considered in my medical decision making (see chart for details).        Patient with viral upper respiratory symptoms versus allergies.  She was prescribed Tessalon, encouraged to start back her allergy medication.  She was given instructions for home isolation until her outpatient COVID testing has resulted.  She is in no respiratory distress, chest x-ray was reviewed.  Parent follow-up anticipated.  Teresa [name] was evaluated in Emergency Department on 11/09/2018 for the symptoms described in the history of present illness. She was evaluated in the context of the global COVID-19 pandemic, which necessitated consideration that the patient might be at risk for infection with the SARS-CoV-2 virus that causes COVID-19. Institutional protocols and algorithms that pertain to the evaluation of patients at risk for COVID-19 are in a state of rapid change based on information released by regulatory bodies including the CDC and federal and state organizations. These policies and algorithms were followed during the patient's care in the ED.   Final Clinical Impressions(s) / ED Diagnoses   Final  diagnoses:  Viral upper respiratory tract infection    ED Discharge Orders         Ordered    benzonatate (TESSALON) 100 MG capsule  3 times daily PRN     11/09/18 1033           Burgess Amordol, Ayan Yankey, PA-C 11/09/18 1036    Samuel JesterMcManus, Kathleen, DO 11/10/18 1706

## 2018-11-09 NOTE — Discharge Instructions (Addendum)
Your chest x-ray is clear.  I suspect you either have a viral upper respiratory infection, or this may be an exacerbation of your allergies.  You may use the Tessalon Perls prescribed which will help you with the tickle sensation that seems to be triggering your cough.  I also recommend you get back on your allergy medication.  Rest make sure you are drinking plenty of fluids.  You have been tested for COVID-19 today and it will take 1 to 2 days before this test is resulted.  You will need to self isolate yourself per instructions below [employer] your test is resulted and is negative.     [place] Under Monitoring Name: Teresa [name]  Location: [address] [place] [id number]   Infection Prevention Recommendations for Individuals Confirmed to have, or Being Evaluated for, 2019 Novel Coronavirus (COVID-19) Infection Who Receive Care at Home  Individuals who are confirmed to have, or are being evaluated for, COVID-19 should follow the prevention steps below until a healthcare provider or local or state health department says they can return to normal activities.  Stay home except to get medical care You should restrict activities outside your home, except for getting medical care. Do not go to work, school, or public areas, and do not use public transportation or taxis.  Call ahead before visiting your doctor Before your medical appointment, call the healthcare provider and tell them that you have, or are being evaluated for, COVID-19 infection. This will help the healthcare providers office take steps to keep other people from getting infected. Ask your healthcare provider to call the local or state health department.  Monitor your symptoms Seek prompt medical attention if your illness is worsening (e.g., difficulty breathing). Before going to your medical appointment, call the healthcare provider and tell them that you have, or are being evaluated for, COVID-19 infection. Ask your  healthcare provider to call the local or state health department.  Wear a facemask You should wear a facemask that covers your nose and mouth when you are in the same room with other people and when you visit a healthcare provider. People who live with or visit you should also wear a facemask while they are in the same room with you.  Separate yourself from other people in your home As much as possible, you should stay in a different room from other people in your home. Also, you should use a separate bathroom, if available.  Avoid sharing household items You should not share dishes, drinking glasses, cups, eating utensils, towels, bedding, or other items with other people in your home. After using these items, you should wash them thoroughly with soap and water.  Cover your coughs and sneezes Cover your mouth and nose with a tissue when you cough or sneeze, or you can cough or sneeze into your sleeve. Throw used tissues in a lined trash can, and immediately wash your hands with soap and water for at least 20 seconds or use an alcohol-based hand rub.  Wash your [employer] Corporationhands Wash your hands often and thoroughly with soap and water for at least 20 seconds. You can use an alcohol-based hand sanitizer if soap and water are not available and if your hands are not visibly dirty. Avoid touching your eyes, nose, and mouth with unwashed hands.   Prevention Steps for Caregivers and Household Members of Individuals Confirmed to have, or Being Evaluated for, COVID-19 Infection Being Cared for in the Home  If you live with, or  provide care at home for, a [place] confirmed to have, or being evaluated for, COVID-19 infection please follow these guidelines to prevent infection:  Follow healthcare providers instructions Make sure that you understand and can help the patient follow any healthcare provider instructions for all care.  Provide for the patients basic needs You should help the patient with  basic needs in the home and provide support for getting groceries, prescriptions, and other personal needs.  Monitor the patients symptoms If they are getting sicker, call his or her medical provider and tell them that the patient has, or is being evaluated for, COVID-19 infection. This will help the healthcare providers office take steps to keep other people from getting infected. Ask the healthcare provider to call the local or state health department.  Limit the number of people who have contact with the patient If possible, have only one caregiver for the patient. Other household members should stay in another home or place of residence. If this is not possible, they should stay in another room, or be separated from the patient as much as possible. Use a separate bathroom, if available. Restrict visitors who do not have an essential need to be in the home.  Keep older adults, very young children, and other sick people away from the patient Keep older adults, very young children, and those who have compromised immune systems or chronic health conditions away from the patient. This includes people with chronic heart, lung, or kidney conditions, diabetes, and cancer.  Ensure good ventilation Make sure that shared spaces in the home have good air flow, such as from an air conditioner or an opened window, weather permitting.  Wash your hands often Wash your hands often and thoroughly with soap and water for at least 20 seconds. You can use an alcohol based hand sanitizer if soap and water are not available and if your hands are not visibly dirty. Avoid touching your eyes, nose, and mouth with unwashed hands. Use disposable paper towels to dry your hands. If not available, use dedicated cloth towels and replace them when they become wet.  Wear a facemask and gloves Wear a disposable facemask at all times in the room and gloves when you touch or have contact with the patients blood, body  fluids, and/or secretions or excretions, such as sweat, saliva, sputum, nasal mucus, vomit, urine, or feces.  Ensure the mask fits over your nose and mouth tightly, and do not touch it during use. Throw out disposable facemasks and gloves after using them. Do not reuse. Wash your hands immediately after removing your facemask and gloves. If your personal clothing becomes contaminated, carefully remove clothing and launder. Wash your hands after handling contaminated clothing. Place all used disposable facemasks, gloves, and other waste in a lined container before disposing them with other household waste. Remove gloves and wash your hands immediately after handling these items.  Do not share dishes, glasses, or other household items with the patient Avoid sharing household items. You should not share dishes, drinking glasses, cups, eating utensils, towels, bedding, or other items with a patient who is confirmed to have, or being evaluated for, COVID-19 infection. After the [place] uses these items, you should wash them thoroughly with soap and water.  Wash laundry thoroughly Immediately remove and wash clothes or bedding that have blood, body fluids, and/or secretions or excretions, such as sweat, saliva, sputum, nasal mucus, vomit, urine, or feces, on them. Wear gloves when handling laundry from the patient. Read and follow  directions on labels of laundry or clothing items and detergent. In general, wash and dry with the warmest temperatures recommended on the label.  Clean all areas the individual has used often Clean all touchable surfaces, such as counters, tabletops, doorknobs, bathroom fixtures, toilets, phones, keyboards, tablets, and bedside tables, every day. Also, clean any surfaces that may have blood, body fluids, and/or secretions or excretions on them. Wear gloves when cleaning surfaces the patient has come in contact with. Use a diluted bleach solution (e.g., dilute bleach with 1  part bleach and 10 parts water) or a household disinfectant with a label that says EPA-registered for coronaviruses. To make a bleach solution at home, add 1 tablespoon of bleach to 1 quart (4 cups) of water. For a larger supply, add  cup of bleach to 1 gallon (16 cups) of water. Read labels of cleaning products and follow recommendations provided on product labels. Labels contain instructions for safe and effective use of the cleaning product including precautions you should take when applying the product, such as wearing gloves or eye protection and making sure you have good ventilation during use of the product. Remove gloves and wash hands immediately after cleaning.  Monitor yourself for signs and symptoms of illness Caregivers and household members are considered close contacts, should monitor their health, and will be asked to limit movement outside of the home to the extent possible. Follow the monitoring steps for close contacts listed on the symptom monitoring form.   ? If you have additional questions, contact your local health department or call the epidemiologist on call at [phone] (available 24/7). ? This guidance is subject to change. For the most up-to-date guidance from [facility], please refer to their website: [email or web address]

## 2018-11-10 ENCOUNTER — Telehealth (HOSPITAL_COMMUNITY): Payer: Self-pay | Admitting: Emergency Medicine

## 2018-11-10 LAB — NOVEL CORONAVIRUS, NAA (HOSP ORDER, SEND-OUT TO REF LAB; TAT 18-24 HRS): SARS-CoV-2, NAA: NOT DETECTED

## 2018-11-10 NOTE — Telephone Encounter (Signed)
Attempted to reach patient x2. No answer at this time. No voicemail set up. Pt had left message over the weekend.

## 2018-11-11 ENCOUNTER — Telehealth: Payer: Self-pay | Admitting: Hematology

## 2018-11-11 ENCOUNTER — Telehealth (HOSPITAL_COMMUNITY): Payer: Self-pay | Admitting: Emergency Medicine

## 2018-11-11 NOTE — Telephone Encounter (Signed)
Attempted to reach patient x3. No answer at this time. No voicemail available.. Letter sent

## 2018-11-11 NOTE — Telephone Encounter (Signed)
Pt is aware covid 19 negative °

## 2019-07-20 ENCOUNTER — Ambulatory Visit: Payer: Medicaid Other | Admitting: Obstetrics and Gynecology

## 2019-07-20 ENCOUNTER — Encounter: Payer: Self-pay | Admitting: Obstetrics and Gynecology

## 2019-07-20 NOTE — Progress Notes (Unsigned)
Spoke with Dr. Alysia Penna regarding Patient no show for appointment. Per Dr. Alysia Penna patient can reschedule themselves.

## 2019-10-19 ENCOUNTER — Ambulatory Visit: Payer: Medicaid Other | Admitting: Obstetrics and Gynecology

## 2019-12-15 ENCOUNTER — Encounter (HOSPITAL_COMMUNITY): Payer: Self-pay

## 2019-12-15 ENCOUNTER — Ambulatory Visit (HOSPITAL_COMMUNITY)
Admission: EM | Admit: 2019-12-15 | Discharge: 2019-12-15 | Disposition: A | Discharge status: Home / Self Care | Payer: Medicaid Other | Attending: Family Medicine | Admitting: Family Medicine

## 2019-12-15 ENCOUNTER — Other Ambulatory Visit: Payer: Self-pay

## 2019-12-15 DIAGNOSIS — J039 Acute tonsillitis, unspecified: Secondary | ICD-10-CM | POA: Diagnosis not present

## 2019-12-15 DIAGNOSIS — J029 Acute pharyngitis, unspecified: Secondary | ICD-10-CM | POA: Diagnosis not present

## 2019-12-15 DIAGNOSIS — Z20822 Contact with and (suspected) exposure to covid-19: Secondary | ICD-10-CM | POA: Diagnosis not present

## 2019-12-15 LAB — POCT RAPID STREP A, ED / UC: Streptococcus, Group A Screen (Direct): NEGATIVE

## 2019-12-15 NOTE — Discharge Instructions (Addendum)
Your strep testing is negative today. I have sent your swab for culture as well to confirm this finding. This is most likely viral in nature, no indication for antibiotics today.  Self isolate until covid results are back and negative.  Will notify you by phone of any positive findings. Your negative results will be sent through your MyChart.     Throat lozenges, gargles, chloraseptic spray, warm teas, popsicles etc to help with throat pain.   If symptoms worsen or do not improve in the next week to return to be seen or to follow up with your PCP.

## 2019-12-15 NOTE — ED Provider Notes (Signed)
[facility]    CSN: [id number] Arrival date & time: 12/15/19  1456      History   Chief Complaint Chief Complaint  Patient presents with  . Sore Throat    HPI Teresa [name] is a 29 y.o. female.   Teresa [name] presents with complaints of sore throat. Pain with swallowing. Tonsil swelling and she describes visible exudate as well. Started yesterday morning. No fever. No headache, no body aches. No previous strep throat. Tested for covid every Monday due to employment and has been negative most recently. No gi symptoms. Some cough. No congestion. No rash. Hasn't taken any medications for symptoms.    ROS per HPI, negative if not otherwise mentioned.      Past Medical History:  Diagnosis Date  . Abnormal Pap smear   . ADHD (attention deficit hyperactivity disorder)   . Asthma    childhood /grew out of it  . Chlamydia   . Degenerative disk disease   . Depression    post partem  . Fractured fibula    left  . History of PCOS   . Migraine   . Trichomonas   . Urinary tract infection   . Vaginal Pap smear, abnormal    normal since    Patient Active Problem List   Diagnosis Date Noted  . Amenorrhea 07/02/2018  . Chronic cystitis 07/02/2018  . DUB (dysfunctional uterine bleeding) 06/12/2015  . Breast mass, right 01/11/2014  . Dysmenorrhea 04/15/2012    Past Surgical History:  Procedure Laterality Date  . DILATION AND CURETTAGE OF UTERUS    . INDUCED ABORTION    . WISDOM TOOTH EXTRACTION      OB History    Gravida  3   Para  1   Term  1   Preterm  0   AB  2   Living  1     SAB  1   TAB  1   Ectopic  0   Multiple  0   Live Births  1            Home Medications    Prior to Admission medications   Medication Sig Start Date End Date Taking? Authorizing Provider  benzonatate (TESSALON) 100 MG capsule Take 2 capsules (200 mg total) by mouth 3 (three) times daily as needed. 11/09/18   Burgess Amor, PA-C    drospirenone-ethinyl estradiol (YASMIN 28) 3-0.03 MG tablet Take 1 tablet by mouth daily. 07/02/18   Hermina Staggers, MD  metroNIDAZOLE (FLAGYL) 500 MG tablet Take 1 tablet (500 mg total) by mouth 2 (two) times daily. 11/04/18   Mickie Bail, NP  nitrofurantoin, macrocrystal-monohydrate, (MACROBID) 100 MG capsule Take 1 capsule (100 mg total) by mouth 2 (two) times daily. Then 1 tablet qhs until gone 07/02/18   Hermina Staggers, MD    Family History Family History  Problem Relation Age of Onset  . Diabetes Mother   . Hypertension Mother   . Heart disease Mother   . Diabetes Maternal Grandfather   . Heart disease Maternal Grandmother        chf  . Anesthesia problems Neg Hx   . Hypotension Neg Hx   . Malignant hyperthermia Neg Hx   . Pseudochol deficiency Neg Hx     Social History Social History   Tobacco Use  . Smoking status: Current Every Day Smoker    Packs/day: 0.50    Years: 7.00    Pack years: 3.50  Types: Cigarettes  . Smokeless tobacco: Never Used  Vaping Use  . Vaping Use: Never used  Substance Use Topics  . Alcohol use: Yes    Alcohol/week: 1.0 standard drink    Types: 1 Shots of liquor per week    Comment: occ but was a heavy user in 2015 due to boyfriend passed away.  . Drug use: No    Types: Marijuana    Comment: Last use today, last use was April 2016     Allergies   Other, Pineapple, Betadine [povidone iodine], Iodine, Latex, Pollen extract, Sulfa antibiotics, Tramadol, Povidone-iodine, and Tomato   Review of Systems Review of Systems   Physical Exam Triage Vital Signs ED Triage Vitals  Enc Vitals Group     BP 12/15/19 1657 (!) 144/98     Pulse Rate 12/15/19 1657 94     Resp 12/15/19 1657 (!) 22     Temp 12/15/19 1657 98.6 F (37 C)     Temp Source 12/15/19 1657 Oral     SpO2 12/15/19 1657 100 %     Weight --      Height --      Head Circumference --      Peak Flow --      Pain Score 12/15/19 1655 10     Pain Loc --      Pain Edu? --       Excl. in GC? --    No data found.  Updated Vital Signs BP (!) 144/98 (BP Location: Left Wrist)   Pulse 94   Temp 98.6 F (37 C) (Oral)   Resp (!) 22   LMP  (Within Weeks) Comment: 1 week  SpO2 100%    Physical Exam Constitutional:      General: She is not in acute distress.    Appearance: She is well-developed.  HENT:     Mouth/Throat:     Tonsils: Tonsillar exudate present. 2+ on the right. 2+ on the left.  Cardiovascular:     Rate and Rhythm: Normal rate.  Pulmonary:     Effort: Pulmonary effort is normal.  Skin:    General: Skin is warm and dry.  Neurological:     Mental Status: She is alert and oriented to [place], place, and time.      UC Treatments / Results  Labs (all labs ordered are listed, but only abnormal results are displayed) Labs Reviewed  SARS CORONAVIRUS 2 (TAT 6-24 HRS)  CULTURE, GROUP A STREP [facility])  POCT RAPID STREP A, ED / UC    EKG   Radiology No results found.  Procedures Procedures (including critical care time)  Medications Ordered in UC Medications - No data to display  Initial Impression / Assessment and Plan / UC Course  I have reviewed the triage vital signs and the nursing notes.  Pertinent labs & imaging results that were available during my care of the patient were reviewed by me and considered in my medical decision making (see chart for details).     Negative rapid strep. covid testing pending. Supportive cares recommended. Return precautions provided. Patient verbalized understanding and agreeable to plan.   Final Clinical Impressions(s) / UC Diagnoses   Final diagnoses:  Acute tonsillitis, unspecified etiology     Discharge Instructions     Your strep testing is negative today. I have sent your swab for culture as well to confirm this finding. This is most likely viral in nature, no indication for antibiotics today.  Self isolate  until covid results are back and negative.  Will notify you by phone of  any positive findings. Your negative results will be sent through your MyChart.     Throat lozenges, gargles, chloraseptic spray, warm teas, popsicles etc to help with throat pain.   If symptoms worsen or do not improve in the next week to return to be seen or to follow up with your PCP.      ED Prescriptions    None     PDMP not reviewed this encounter.   [name] Haber, NP 12/15/19 1755

## 2019-12-15 NOTE — ED Triage Notes (Signed)
Pt presents with sore throat x 2 days. States the back of her throat is white. Denies fever.

## 2019-12-16 LAB — SARS CORONAVIRUS 2 (TAT 6-24 HRS): SARS Coronavirus 2: NEGATIVE

## 2019-12-18 LAB — CULTURE, GROUP A STREP (THRC)

## 2020-02-24 IMAGING — CT CT HEAD W/O CM
4 series · 17 of 47 positions shown, 19 images · non-contrast
Comparison: 11/09/2014 CT

CLINICAL DATA: 26-year-old female with acute headache following
injury 3 days ago.

EXAM:
CT HEAD WITHOUT CONTRAST
TECHNIQUE: Contiguous axial images were obtained from the base of the skull
through the vertex without intravenous contrast.

[Series 3: head without · axial · non-contrast · 0.43mm/px · z∈[-33,+87]mm · 7 of 32 slices shown, 9 images]
[im 4/32  brain]
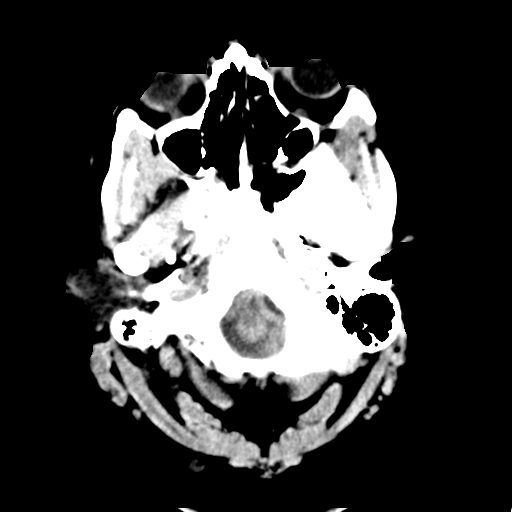
[im 4/32  bone]
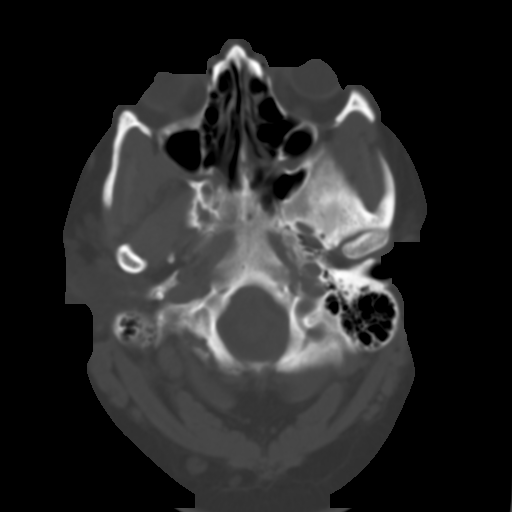
[im 8/32  brain]
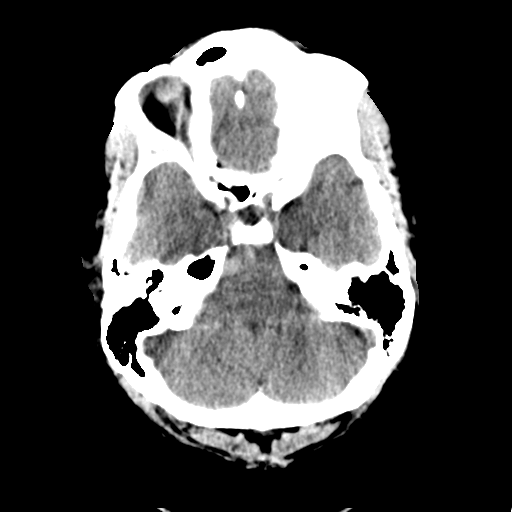
[im 12/32  brain]
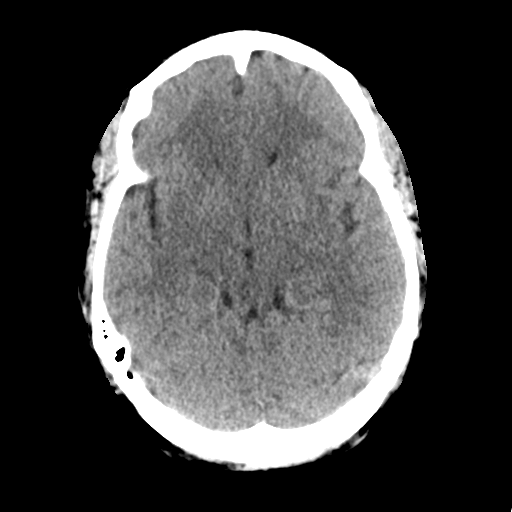
[im 16/32  brain]
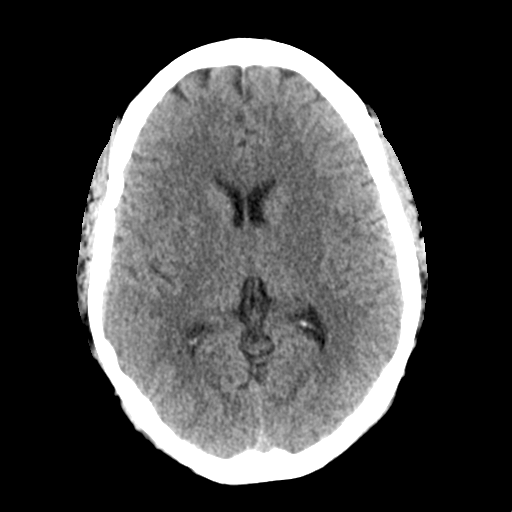
[im 20/32  brain]
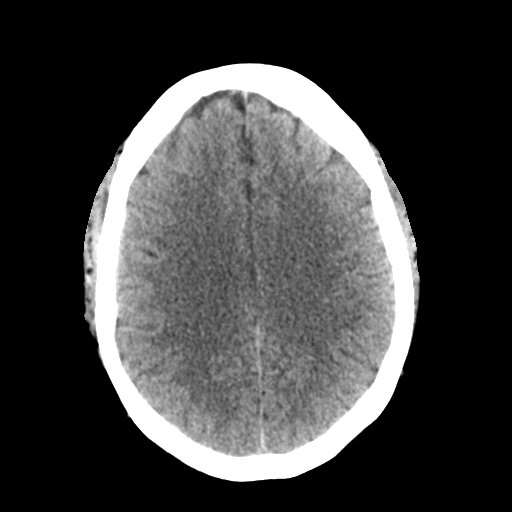
[im 20/32  bone]
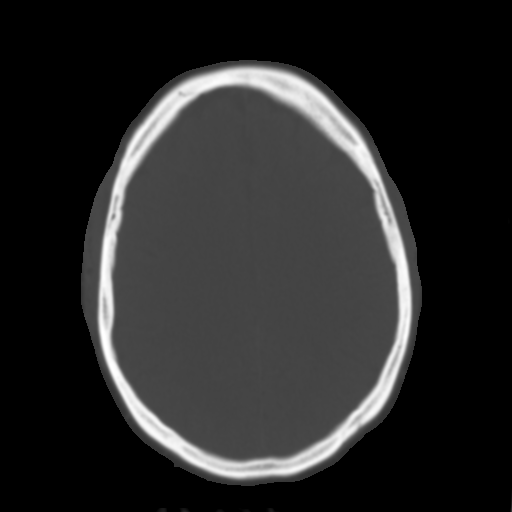
[im 24/32  brain]
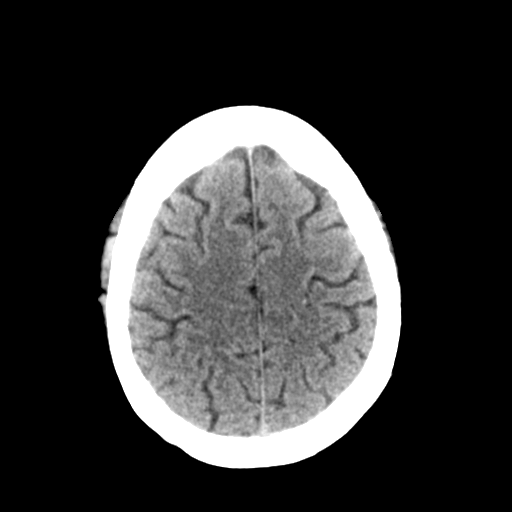
[im 28/32  brain]
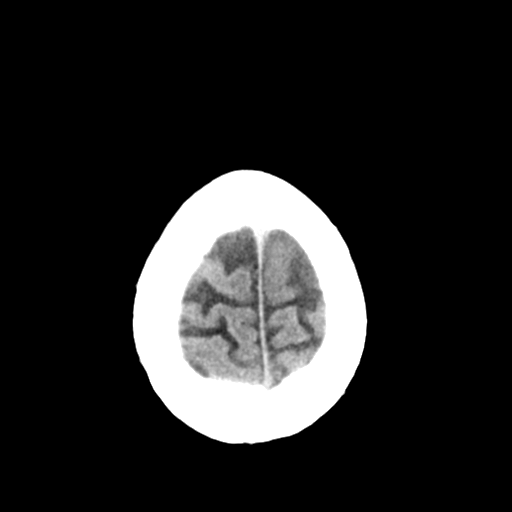

[Series 4: head bone · axial · 0.43mm/px · z∈[-34,+22]mm · 4 of 80 slices shown]
[im 8/80  bone]
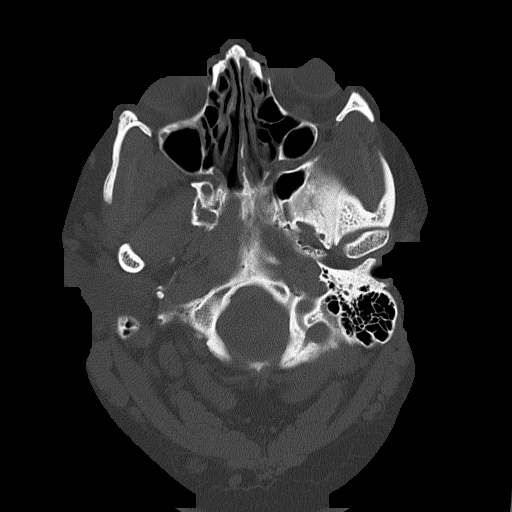
[im 16/80  bone]
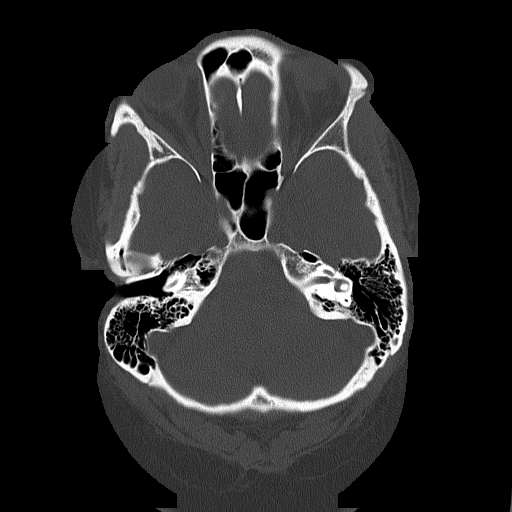
[im 24/80  bone]
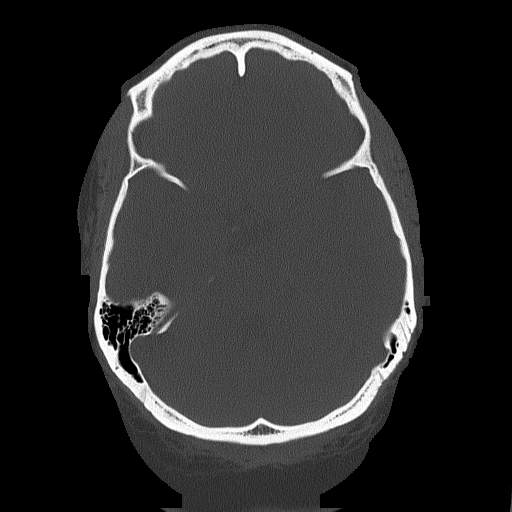
[im 36/80  bone]
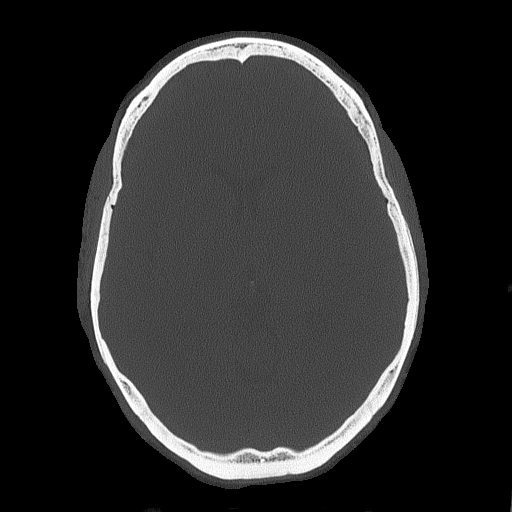

[Series 5: head without cor · coronal · non-contrast · 0.31mm/px · 3 of 67 slices shown]
[im 23/67  brain]
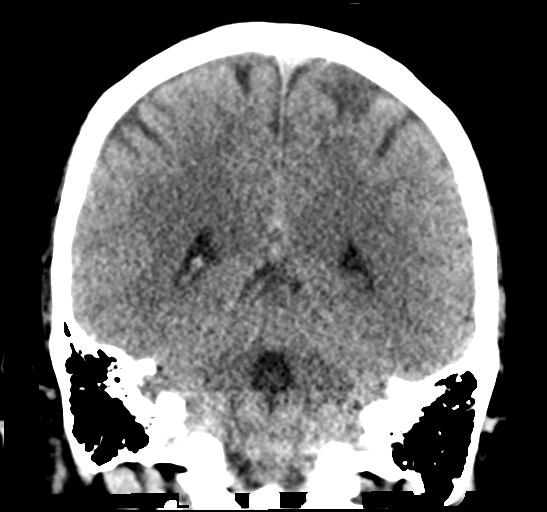
[im 30/67  brain]
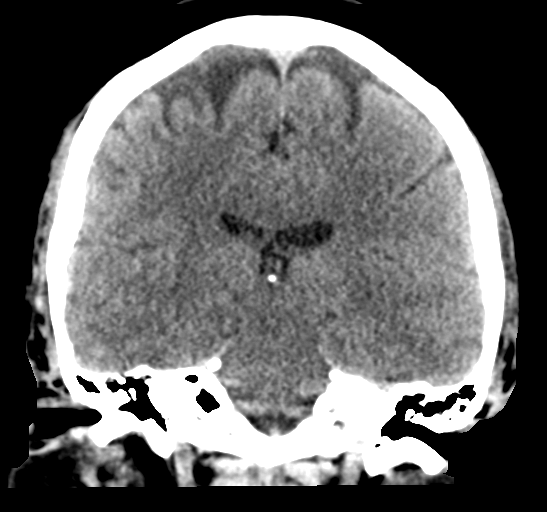
[im 37/67  brain]
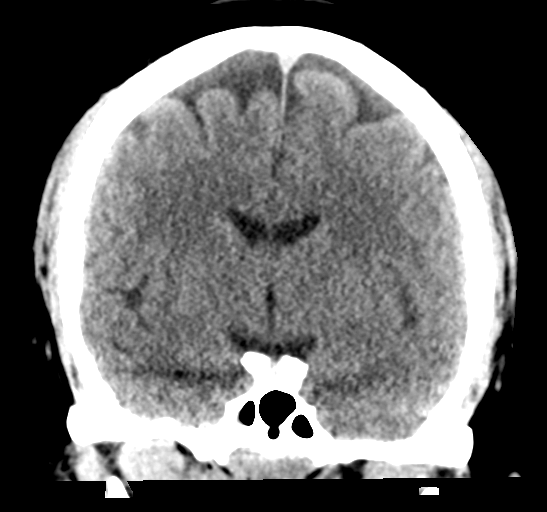

[Series 6: head without sag · sagittal · non-contrast · 0.31mm/px · 3 of 67 slices shown]
[im 23/67  brain]
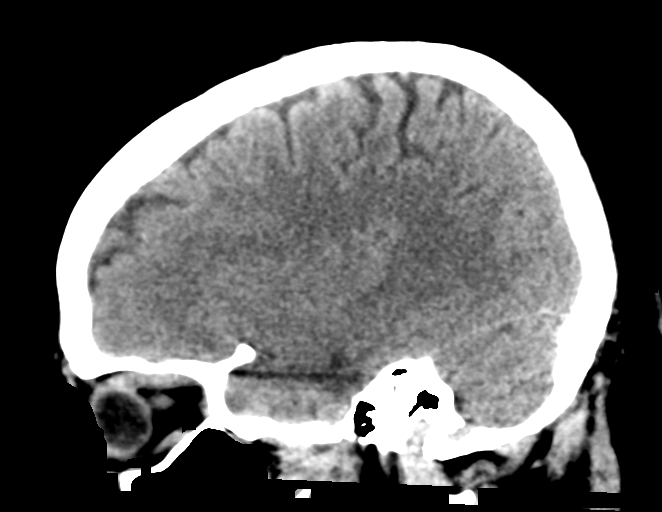
[im 34/67  brain]
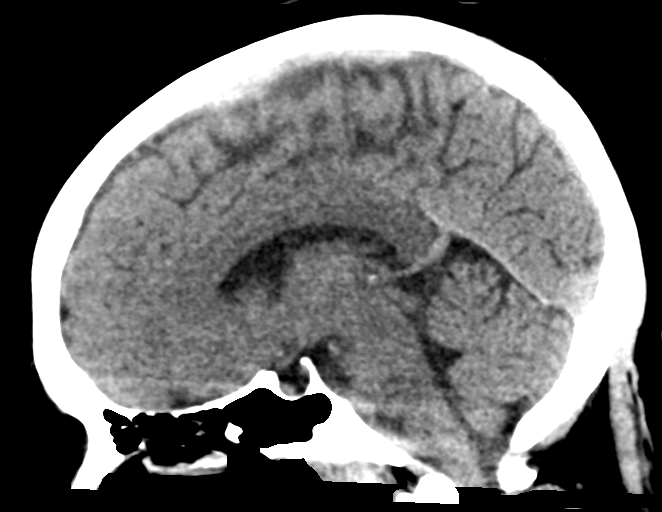
[im 45/67  brain]
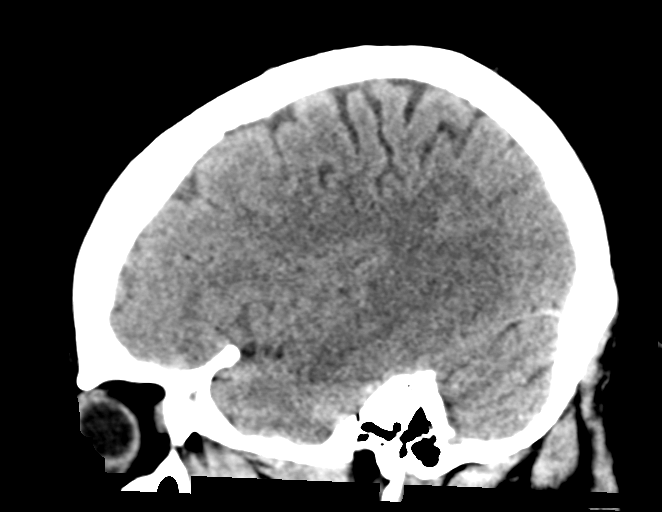

[17 of 47 positions shown; findings below may reference images not displayed]

FINDINGS: Brain: No evidence of acute infarction, hemorrhage, hydrocephalus,
extra-axial collection or mass lesion/mass effect.

Vascular: No hyperdense vessel or unexpected calcification.

Skull: Normal. Negative for fracture or focal lesion.

Sinuses/Orbits: No acute finding. RIGHT sphenoid sinus mucosal
thickening identified.

Other: None.
IMPRESSION: No evidence of intracranial abnormality.

## 2020-04-16 ENCOUNTER — Encounter (HOSPITAL_COMMUNITY): Payer: Self-pay | Admitting: Emergency Medicine

## 2020-04-16 ENCOUNTER — Emergency Department (HOSPITAL_COMMUNITY)
Admission: EM | Admit: 2020-04-16 | Discharge: 2020-04-16 | Disposition: A | Discharge status: Home / Self Care | Payer: Medicaid Other | Attending: Emergency Medicine | Admitting: Emergency Medicine

## 2020-04-16 ENCOUNTER — Other Ambulatory Visit: Payer: Self-pay

## 2020-04-16 DIAGNOSIS — J039 Acute tonsillitis, unspecified: Secondary | ICD-10-CM | POA: Diagnosis not present

## 2020-04-16 DIAGNOSIS — Z91040 Latex allergy status: Secondary | ICD-10-CM | POA: Insufficient documentation

## 2020-04-16 DIAGNOSIS — J45909 Unspecified asthma, uncomplicated: Secondary | ICD-10-CM | POA: Insufficient documentation

## 2020-04-16 DIAGNOSIS — R070 Pain in throat: Secondary | ICD-10-CM | POA: Diagnosis present

## 2020-04-16 DIAGNOSIS — F17210 Nicotine dependence, cigarettes, uncomplicated: Secondary | ICD-10-CM | POA: Insufficient documentation

## 2020-04-16 LAB — GROUP A STREP BY PCR: Group A Strep by PCR: NOT DETECTED

## 2020-04-16 MED ORDER — DEXAMETHASONE 4 MG PO TABS
10.0000 mg | ORAL_TABLET | Freq: Once | ORAL | Status: AC
  Administered 2020-04-16: 12:00:00 10 mg via ORAL
  Filled 2020-04-16: qty 3

## 2020-04-16 MED ORDER — HYDROCODONE-ACETAMINOPHEN 5-325 MG PO TABS: 1.0000 | ORAL_TABLET | Freq: Four times a day (QID) | ORAL | 0 refills | Status: DC | PRN

## 2020-04-16 NOTE — ED Provider Notes (Signed)
[facility] EMERGENCY DEPARTMENT Provider Note   CSN: [id number] Arrival date & time: 04/16/20  0844     History Chief Complaint  Patient presents with  . swollen tonsil    Teresa [name] is a 29 y.o. female.  HPI Patient presents with sore throat over the last couple weeks.  Some swelling.  States besides her throat she feels fine.  No fevers or chills.  No myalgias.  States there is some swelling on the left side of her neck.  States has a history of sore throats and previously used to get strep.  No known sick contacts.  States she thinks she needs her tonsils out.    Past Medical History:  Diagnosis Date  . Abnormal Pap smear   . ADHD (attention deficit hyperactivity disorder)   . Asthma    childhood /grew out of it  . Chlamydia   . Degenerative disk disease   . Depression    post partem  . Fractured fibula    left  . History of PCOS   . Migraine   . Trichomonas   . Urinary tract infection   . Vaginal Pap smear, abnormal    normal since    Patient Active Problem List   Diagnosis Date Noted  . Amenorrhea 07/02/2018  . Chronic cystitis 07/02/2018  . DUB (dysfunctional uterine bleeding) 06/12/2015  . Breast mass, right 01/11/2014  . Dysmenorrhea 04/15/2012    Past Surgical History:  Procedure Laterality Date  . DILATION AND CURETTAGE OF UTERUS    . INDUCED ABORTION    . WISDOM TOOTH EXTRACTION       OB History    Gravida  3   Para  1   Term  1   Preterm  0   AB  2   Living  1     SAB  1   IAB  1   Ectopic  0   Multiple  0   Live Births  1           Family History  Problem Relation Age of Onset  . Diabetes Mother   . Hypertension Mother   . Heart disease Mother   . Diabetes Maternal Grandfather   . Heart disease Maternal Grandmother        chf  . Anesthesia problems Neg Hx   . Hypotension Neg Hx   . Malignant hyperthermia Neg Hx   . Pseudochol deficiency Neg Hx     Social History   Tobacco Use   . Smoking status: Current Every Day Smoker    Packs/day: 0.50    Years: 7.00    Pack years: 3.50    Types: Cigarettes  . Smokeless tobacco: Never Used  Vaping Use  . Vaping Use: Never used  Substance Use Topics  . Alcohol use: Yes    Alcohol/week: 1.0 standard drink    Types: 1 Shots of liquor per week    Comment: occ but was a heavy user in 2015 due to boyfriend passed away.  . Drug use: No    Types: Marijuana    Comment: Last use today, last use was April 2016    Home Medications Prior to Admission medications   Medication Sig Start Date End Date Taking? Authorizing Provider  benzonatate (TESSALON) 100 MG capsule Take 2 capsules (200 mg total) by mouth 3 (three) times daily as needed. 11/09/18   Burgess Amor, PA-C  drospirenone-ethinyl estradiol (YASMIN 28) 3-0.03 MG tablet Take 1 tablet  by mouth daily. 07/02/18   Hermina Staggers, MD  HYDROcodone-acetaminophen (NORCO/VICODIN) 5-325 MG tablet Take 1-2 tablets by mouth every 6 (six) hours as needed. 04/16/20   Benjiman Core, MD  metroNIDAZOLE (FLAGYL) 500 MG tablet Take 1 tablet (500 mg total) by mouth 2 (two) times daily. 11/04/18   Mickie Bail, NP  nitrofurantoin, macrocrystal-monohydrate, (MACROBID) 100 MG capsule Take 1 capsule (100 mg total) by mouth 2 (two) times daily. Then 1 tablet qhs until gone 07/02/18   Hermina Staggers, MD    Allergies    Other, Pineapple, Betadine [povidone iodine], Iodine, Latex, Pollen extract, Sulfa antibiotics, Tramadol, Povidone-iodine, and Tomato  Review of Systems   Review of Systems  Constitutional: Negative for appetite change, fatigue and fever.  HENT: Positive for sore throat. Negative for facial swelling, trouble swallowing and voice change.   Respiratory: Negative for shortness of breath.   Cardiovascular: Negative for chest pain.  Gastrointestinal: Negative for abdominal pain.  Musculoskeletal: Negative for back pain.  Neurological: Negative for weakness.  Psychiatric/Behavioral:  Negative for confusion.    Physical Exam Updated Vital Signs BP (!) 149/102 (BP Location: Left Arm)   Pulse (!) 110   Temp 99.2 F (37.3 C) (Oral)   Resp 18   Ht 5\' 1"  (1.549 m)   Wt (!) 141.5 kg   SpO2 97%   BMI 58.95 kg/m   Physical Exam Vitals and nursing note reviewed.  HENT:     Head: Atraumatic.     Mouth/Throat:     Pharynx: No oropharyngeal exudate.     Comments: Bilateral tonsillar swelling without exudate.  Almost kissing.  No stridor.  Uvula midline.  Swelling fairly symmetric. Neck:     Comments: Anterior cervical lymphadenopathy, particularly on left Cardiovascular:     Rate and Rhythm: Regular rhythm.  Pulmonary:     Effort: Pulmonary effort is normal.  Abdominal:     Tenderness: There is no abdominal tenderness.  Musculoskeletal:        General: No tenderness.  Skin:    General: Skin is warm.     Capillary Refill: Capillary refill takes less than 2 seconds.  Neurological:     Mental Status: She is alert and oriented to [place], place, and time.     ED Results / Procedures / Treatments   Labs (all labs ordered are listed, but only abnormal results are displayed) Labs Reviewed  GROUP A STREP BY PCR    EKG None  Radiology No results found.  Procedures Procedures (including critical care time)  Medications Ordered in ED Medications  dexamethasone (DECADRON) tablet 10 mg (10 mg Oral Given 04/16/20 1155)    ED Course  I have reviewed the triage vital signs and the nursing notes.  Pertinent labs & imaging results that were available during my care of the patient were reviewed by me and considered in my medical decision making (see chart for details).    MDM Rules/Calculators/A&P                          Patient with sore throat.  Negative strep test.  No abscess seen.  No exudate.  Will treat symptomatically with steroids and pain medicine.  Afebrile.  Doubt mononucleosis.  No fatigue.  Does not feel systemic symptoms.  Steroids given and  will give pain medicines.  Outpatient follow-up with ENT since she is having recurrent episodes Doubt peritonsillar abscess or abscess in her neck. Final Clinical Impression(s) /  ED Diagnoses Final diagnoses:  Tonsillitis    Rx / DC Orders ED Discharge Orders         Ordered    HYDROcodone-acetaminophen (NORCO/VICODIN) 5-325 MG tablet  Every 6 hours PRN        04/16/20 1210           Benjiman Core, MD 04/16/20 1214

## 2020-04-16 NOTE — ED Triage Notes (Signed)
C/o L tonsil swelling x 2 weeks.  Denies pain.  Denies fever/chills.

## 2020-04-16 NOTE — ED Notes (Signed)
Patient given discharge instructions. Questions were answered. Patient verbalized understanding of discharge instructions and care at home.  

## 2020-06-01 ENCOUNTER — Other Ambulatory Visit: Payer: Self-pay | Admitting: Otolaryngology

## 2020-07-31 ENCOUNTER — Other Ambulatory Visit (HOSPITAL_COMMUNITY)
Admission: RE | Admit: 2020-07-31 | Discharge: 2020-07-31 | Disposition: A | Discharge status: Home / Self Care | Payer: Medicaid Other | Source: Ambulatory Visit | Attending: Otolaryngology | Admitting: Otolaryngology

## 2020-07-31 DIAGNOSIS — Z20822 Contact with and (suspected) exposure to covid-19: Secondary | ICD-10-CM | POA: Diagnosis not present

## 2020-07-31 DIAGNOSIS — Z01812 Encounter for preprocedural laboratory examination: Secondary | ICD-10-CM | POA: Diagnosis not present

## 2020-08-01 ENCOUNTER — Encounter (HOSPITAL_COMMUNITY): Payer: Self-pay | Admitting: Otolaryngology

## 2020-08-01 ENCOUNTER — Other Ambulatory Visit: Payer: Self-pay

## 2020-08-01 LAB — SARS CORONAVIRUS 2 (TAT 6-24 HRS): SARS Coronavirus 2: NEGATIVE

## 2020-08-01 NOTE — Progress Notes (Signed)
PCP - [facility]  Cardiologist - denies  PPM/ICD - denies  Chest x-ray - denies EKG - denies Stress Test -denies  ECHO - denies Cardiac Cath - denies  Sleep Study - denies  Covid test: 07/31/20     Anesthesia review: no  Patient denies shortness of breath, fever, cough and chest pain at PAT appointment   All instructions explained to the patient, with a verbal understanding of the material. Patient also instructed to self quarantine after being tested for COVID-19. The opportunity to ask questions was provided.

## 2020-08-02 ENCOUNTER — Ambulatory Visit (HOSPITAL_COMMUNITY): Payer: Medicaid Other | Admitting: Anesthesiology

## 2020-08-02 ENCOUNTER — Ambulatory Visit (HOSPITAL_COMMUNITY)
Admission: RE | Admit: 2020-08-02 | Discharge: 2020-08-02 | Disposition: A | Discharge status: Home / Self Care | Payer: Medicaid Other | Attending: Otolaryngology | Admitting: Otolaryngology

## 2020-08-02 ENCOUNTER — Encounter (HOSPITAL_COMMUNITY)
Admission: RE | Disposition: A | Discharge status: Home / Self Care | Payer: Self-pay | Source: Home / Self Care | Attending: Otolaryngology

## 2020-08-02 ENCOUNTER — Encounter (HOSPITAL_COMMUNITY): Payer: Self-pay | Admitting: Otolaryngology

## 2020-08-02 DIAGNOSIS — F17210 Nicotine dependence, cigarettes, uncomplicated: Secondary | ICD-10-CM | POA: Insufficient documentation

## 2020-08-02 DIAGNOSIS — J353 Hypertrophy of tonsils with hypertrophy of adenoids: Secondary | ICD-10-CM | POA: Insufficient documentation

## 2020-08-02 DIAGNOSIS — J3501 Chronic tonsillitis: Secondary | ICD-10-CM | POA: Insufficient documentation

## 2020-08-02 DIAGNOSIS — Z6841 Body Mass Index (BMI) 40.0 and over, adult: Secondary | ICD-10-CM | POA: Insufficient documentation

## 2020-08-02 DIAGNOSIS — E669 Obesity, unspecified: Secondary | ICD-10-CM | POA: Insufficient documentation

## 2020-08-02 HISTORY — DX: Gastro-esophageal reflux disease without esophagitis: K21.9

## 2020-08-02 HISTORY — PX: TONSILLECTOMY AND ADENOIDECTOMY: SHX28

## 2020-08-02 SURGERY — TONSILLECTOMY AND ADENOIDECTOMY
Anesthesia: General | Site: Throat

## 2020-08-02 MED ORDER — OXYCODONE HCL 5 MG PO TABS
ORAL_TABLET | ORAL | Status: AC
  Administered 2020-08-02: 5 mg via ORAL
  Filled 2020-08-02: qty 1

## 2020-08-02 MED ORDER — ACETAMINOPHEN 10 MG/ML IV SOLN: 1000.0000 mg | Freq: Once | INTRAVENOUS | Status: DC | PRN

## 2020-08-02 MED ORDER — MIDAZOLAM HCL 2 MG/2ML IJ SOLN
INTRAMUSCULAR | Status: DC | PRN
  Administered 2020-08-02: 2 mg via INTRAVENOUS

## 2020-08-02 MED ORDER — FENTANYL CITRATE (PF) 100 MCG/2ML IJ SOLN: 25.0000 ug | INTRAMUSCULAR | Status: DC | PRN

## 2020-08-02 MED ORDER — FENTANYL CITRATE (PF) 250 MCG/5ML IJ SOLN
INTRAMUSCULAR | Status: DC | PRN
  Administered 2020-08-02: 100 ug via INTRAVENOUS
  Administered 2020-08-02 (×2): 50 ug via INTRAVENOUS

## 2020-08-02 MED ORDER — ACETAMINOPHEN 10 MG/ML IV SOLN
INTRAVENOUS | Status: DC | PRN
  Administered 2020-08-02: 1000 mg via INTRAVENOUS

## 2020-08-02 MED ORDER — SODIUM CHLORIDE 0.9 % IR SOLN
Status: DC | PRN
  Administered 2020-08-02: 1000 mL

## 2020-08-02 MED ORDER — DEXMEDETOMIDINE (PRECEDEX) IN NS 20 MCG/5ML (4 MCG/ML) IV SYRINGE
PREFILLED_SYRINGE | INTRAVENOUS | Status: DC | PRN
  Administered 2020-08-02: 12 ug via INTRAVENOUS
  Administered 2020-08-02: 8 ug via INTRAVENOUS

## 2020-08-02 MED ORDER — SUCCINYLCHOLINE CHLORIDE 200 MG/10ML IV SOSY
PREFILLED_SYRINGE | INTRAVENOUS | Status: DC | PRN
  Administered 2020-08-02: 120 mg via INTRAVENOUS

## 2020-08-02 MED ORDER — DEXAMETHASONE SODIUM PHOSPHATE 10 MG/ML IJ SOLN
INTRAMUSCULAR | Status: DC | PRN
  Administered 2020-08-02: 10 mg via INTRAVENOUS

## 2020-08-02 MED ORDER — AMISULPRIDE (ANTIEMETIC) 5 MG/2ML IV SOLN: 10.0000 mg | Freq: Once | INTRAVENOUS | Status: DC | PRN

## 2020-08-02 MED ORDER — ONDANSETRON HCL 4 MG/2ML IJ SOLN
INTRAMUSCULAR | Status: DC | PRN
  Administered 2020-08-02: 4 mg via INTRAVENOUS

## 2020-08-02 MED ORDER — LIDOCAINE 2% (20 MG/ML) 5 ML SYRINGE
INTRAMUSCULAR | Status: DC | PRN
  Administered 2020-08-02: 100 mg via INTRAVENOUS

## 2020-08-02 MED ORDER — SUGAMMADEX SODIUM 200 MG/2ML IV SOLN
INTRAVENOUS | Status: DC | PRN
  Administered 2020-08-02: 300 mg via INTRAVENOUS

## 2020-08-02 MED ORDER — OXYMETAZOLINE HCL 0.05 % NA SOLN
NASAL | Status: DC | PRN
  Administered 2020-08-02: 1 via TOPICAL

## 2020-08-02 MED ORDER — MIDAZOLAM HCL 2 MG/2ML IJ SOLN
INTRAMUSCULAR | Status: AC
  Filled 2020-08-02: qty 2

## 2020-08-02 MED ORDER — CHLORHEXIDINE GLUCONATE 0.12 % MT SOLN
15.0000 mL | Freq: Once | OROMUCOSAL | Status: AC
  Administered 2020-08-02: 15 mL via OROMUCOSAL
  Filled 2020-08-02: qty 15

## 2020-08-02 MED ORDER — PROMETHAZINE HCL 25 MG/ML IJ SOLN: 6.2500 mg | INTRAMUSCULAR | Status: DC | PRN

## 2020-08-02 MED ORDER — AMOXICILLIN 400 MG/5ML PO SUSR: 800.0000 mg | Freq: Two times a day (BID) | ORAL | 0 refills | Status: AC

## 2020-08-02 MED ORDER — OXYMETAZOLINE HCL 0.05 % NA SOLN
NASAL | Status: AC
  Filled 2020-08-02: qty 30

## 2020-08-02 MED ORDER — PROPOFOL 10 MG/ML IV BOLUS
INTRAVENOUS | Status: DC | PRN
  Administered 2020-08-02: 200 mg via INTRAVENOUS

## 2020-08-02 MED ORDER — 0.9 % SODIUM CHLORIDE (POUR BTL) OPTIME
TOPICAL | Status: DC | PRN
  Administered 2020-08-02: 1000 mL

## 2020-08-02 MED ORDER — PROPOFOL 500 MG/50ML IV EMUL
INTRAVENOUS | Status: DC | PRN
  Administered 2020-08-02: 20 ug/kg/min via INTRAVENOUS

## 2020-08-02 MED ORDER — HYDROCODONE-ACETAMINOPHEN 7.5-325 MG/15ML PO SOLN: 15.0000 mL | ORAL | 0 refills | Status: AC | PRN

## 2020-08-02 MED ORDER — ROCURONIUM BROMIDE 10 MG/ML (PF) SYRINGE
PREFILLED_SYRINGE | INTRAVENOUS | Status: DC | PRN
  Administered 2020-08-02: 40 mg via INTRAVENOUS

## 2020-08-02 MED ORDER — PROPOFOL 10 MG/ML IV BOLUS
INTRAVENOUS | Status: AC
  Filled 2020-08-02: qty 20

## 2020-08-02 MED ORDER — OXYCODONE HCL 5 MG PO TABS: 5.0000 mg | ORAL_TABLET | ORAL | Status: DC | PRN

## 2020-08-02 MED ORDER — ORAL CARE MOUTH RINSE: 15.0000 mL | Freq: Once | OROMUCOSAL | Status: AC

## 2020-08-02 MED ORDER — ACETAMINOPHEN 10 MG/ML IV SOLN
INTRAVENOUS | Status: AC
  Filled 2020-08-02: qty 100

## 2020-08-02 MED ORDER — FENTANYL CITRATE (PF) 250 MCG/5ML IJ SOLN
INTRAMUSCULAR | Status: AC
  Filled 2020-08-02: qty 5

## 2020-08-02 MED ORDER — LACTATED RINGERS IV SOLN: INTRAVENOUS | Status: DC

## 2020-08-02 SURGICAL SUPPLY — 25 items
CANISTER SUCT 3000ML PPV (MISCELLANEOUS) ×2 IMPLANT
CATH FOLEY LATEX FREE 12 FR (CATHETERS) ×2
CATH FOLEY LF 12 FR (CATHETERS) IMPLANT
CATH ROBINSON RED A/P 10FR (CATHETERS) IMPLANT
CNTNR URN SCR LID CUP LEK RST (MISCELLANEOUS) IMPLANT
COAGULATOR SUCT SWTCH 10FR 6 (ELECTROSURGICAL) IMPLANT
CONT SPEC 4OZ STRL OR WHT (MISCELLANEOUS) ×2
ELECT REM PT RETURN 9FT ADLT (ELECTROSURGICAL) ×2
ELECT REM PT RETURN 9FT PED (ELECTROSURGICAL)
ELECTRODE REM PT RETRN 9FT PED (ELECTROSURGICAL) IMPLANT
ELECTRODE REM PT RTRN 9FT ADLT (ELECTROSURGICAL) IMPLANT
GAUZE 4X4 16PLY RFD (DISPOSABLE) ×2 IMPLANT
GLOVE ECLIPSE 7.5 STRL STRAW (GLOVE) ×1 IMPLANT
GOWN STRL REUS W/ TWL LRG LVL3 (GOWN DISPOSABLE) ×2 IMPLANT
GOWN STRL REUS W/TWL LRG LVL3 (GOWN DISPOSABLE) ×4
KIT BASIN OR (CUSTOM PROCEDURE TRAY) ×2 IMPLANT
KIT TURNOVER KIT B (KITS) ×2 IMPLANT
NS IRRIG 1000ML POUR BTL (IV SOLUTION) ×2 IMPLANT
PACK SURGICAL SETUP 50X90 (CUSTOM PROCEDURE TRAY) ×2 IMPLANT
SPONGE TONSIL TAPE 1 RFD (DISPOSABLE) ×2 IMPLANT
SYR BULB EAR ULCER 3OZ GRN STR (SYRINGE) ×2 IMPLANT
TOWEL GREEN STERILE FF (TOWEL DISPOSABLE) ×4 IMPLANT
TUBE CONNECTING 12X1/4 (SUCTIONS) ×2 IMPLANT
TUBE SALEM SUMP 16 FR W/ARV (TUBING) ×2 IMPLANT
WAND COBLATOR 70 EVAC XTRA (SURGICAL WAND) ×2 IMPLANT

## 2020-08-02 NOTE — Anesthesia Procedure Notes (Signed)
Procedure Name: Intubation Date/Time: 08/02/2020 10:22 AM Performed by: Darletta Moll, CRNA Pre-anesthesia Checklist: Patient identified, Emergency Drugs available, Suction available and Patient being monitored Patient Re-evaluated:Patient Re-evaluated prior to induction Oxygen Delivery Method: Circle system utilized Preoxygenation: Pre-oxygenation with 100% oxygen Induction Type: IV induction Ventilation: Mask ventilation without difficulty Laryngoscope Size: Mac and 3 Grade View: Grade I Tube type: Oral Tube size: 7.0 mm Number of attempts: 1 Airway Equipment and Method: Stylet Placement Confirmation: ETT inserted through vocal cords under direct vision,  positive ETCO2 and breath sounds checked- equal and bilateral Secured at: 21 cm Tube secured with: Tape Dental Injury: Teeth and Oropharynx as per pre-operative assessment

## 2020-08-02 NOTE — Op Note (Signed)
DATE OF PROCEDURE:  08/02/2020                              OPERATIVE REPORT  SURGEON:  Newman Pies, MD  PREOPERATIVE DIAGNOSES: 1. Adenotonsillar hypertrophy. 2. Chronic tonsillitis and pharyngitis  POSTOPERATIVE DIAGNOSES: 1. Adenotonsillar hypertrophy. 2. Chronic tonsillitis and pharyngitis  PROCEDURE PERFORMED:  Adenotonsillectomy.  ANESTHESIA:  General endotracheal tube anesthesia.  COMPLICATIONS:  None.  ESTIMATED BLOOD LOSS:  Minimal.  INDICATION FOR PROCEDURE:  Teresa [name] is a 30 y.o. female with a history of chronic tonsillitis/pharyngitis and halitosis.  According to the patient, she has been experiencing chronic throat discomfort with halitosis for several years. The patient continued to be symptomatic despite medical treatments. On examination, the patient was noted to have bilateral cryptic tonsils, with numerous tonsilloliths. Based on the above findings, the decision was made for the patient to undergo the adenotonsillectomy procedure. Likelihood of success in reducing symptoms was also discussed.  The risks, benefits, alternatives, and details of the procedure were discussed with the patient.  Questions were invited and answered.  Informed consent was obtained.  DESCRIPTION:  The patient was taken to the operating room and placed supine on the operating table.  General endotracheal tube anesthesia was administered by the anesthesiologist.  The patient was positioned and prepped and draped in a standard fashion for adenotonsillectomy.  A Crowe-Davis mouth gag was inserted into the oral cavity for exposure. 4+ cryptic tonsils were noted bilaterally.  No bifidity was noted.  Indirect mirror examination of the nasopharynx revealed moderate adenoid hypertrophy. The adenoid was ablated with the Coblator device. Hemostasis was achieved with the Coblator device.  The right tonsil was then grasped with a straight Allis clamp and retracted medially.  It was resected free from the  underlying pharyngeal constrictor muscles with the Coblator device.  The same procedure was repeated on the left side without exception.  The surgical sites were copiously irrigated.  The mouth gag was removed.  The care of the patient was turned over to the anesthesiologist.  The patient was awakened from anesthesia without difficulty.  The patient was extubated and transferred to the recovery room in good condition.  OPERATIVE FINDINGS:  Severe adenotonsillar hypertrophy.  SPECIMEN:  Bilateral tonsils  FOLLOWUP CARE:  The patient will be discharged home once awake and alert.  She will be placed on amoxicillin 800 mg p.o. b.i.d. for 5 days, and hycet for postop pain control.   The patient will follow up in my office in approximately 2 weeks.  Ronnie Doo W Jerardo Costabile 08/02/2020 10:49 AM

## 2020-08-02 NOTE — Progress Notes (Signed)
Patient very emotional and stating she wants to leave. Dr Nance Pew notified of patients request. Vital signs stable. o2 sats 96% on room air. Aldrete score 10. OK to discharge.

## 2020-08-02 NOTE — Anesthesia Postprocedure Evaluation (Signed)
Anesthesia Post Note  Patient: Teresa [name]  Procedure(s) Performed: TONSILLECTOMY AND ADENOIDECTOMY (N/A Throat)     Patient location during evaluation: PACU Anesthesia Type: General Level of consciousness: awake and alert Pain management: pain level controlled Vital Signs Assessment: post-procedure vital signs reviewed and stable Respiratory status: spontaneous breathing, nonlabored ventilation, respiratory function stable and patient connected to nasal cannula oxygen Cardiovascular status: blood pressure returned to baseline and stable Postop Assessment: no apparent nausea or vomiting Anesthetic complications: no   No complications documented.  Last Vitals:  Vitals:   08/02/20 1105 08/02/20 1115  BP: 129/84 119/85  Pulse: (!) 105 (!) 101  Resp: 18 20  Temp:  (!) 36.1 C  SpO2: 97% 100%    Last Pain:  Vitals:   08/02/20 1115  TempSrc:   PainSc: 10-Worst pain ever                 Earl Lites P Rhaya Coale

## 2020-08-02 NOTE — Transfer of Care (Signed)
Immediate Anesthesia Transfer of Care Note  Patient: Teresa [name]  Procedure(s) Performed: TONSILLECTOMY AND ADENOIDECTOMY (N/A Throat)  Patient Location: PACU  Anesthesia Type:General  Level of Consciousness: drowsy and patient cooperative  Airway & Oxygen Therapy: Patient Spontanous Breathing  Post-op Assessment: Report given to RN, Post -op Vital signs reviewed and stable and Patient moving all extremities X 4  Post vital signs: Reviewed and stable  Last Vitals:  Vitals Value Taken Time  BP 174/137 08/02/20 1104  Temp 36.1 C 08/02/20 1100  Pulse 105 08/02/20 1105  Resp 18 08/02/20 1105  SpO2 97 % 08/02/20 1105  Vitals shown include unvalidated device data.  Last Pain:  Vitals:   08/02/20 0806  TempSrc:   PainSc: 6       Patients Stated Pain Goal: 6 (08/02/20 0806)  Complications: No complications documented.

## 2020-08-02 NOTE — Discharge Instructions (Addendum)
SU WOOI TEOH M.D., P.A. °Postoperative Instructions for Tonsillectomy & Adenoidectomy (T&A) °Activity °Restrict activity at home for the first two days, resting as much as possible. Light indoor activity is best. You may usually return to school or work within a week but void strenuous activity and sports for two weeks. Sleep with your head elevated on 2-3 pillows for 3-4 days to help decrease swelling. °Diet °Due to tissue swelling and throat discomfort, you may have little desire to drink for several days. However fluids are very important to prevent dehydration. You will find that non-acidic juices, soups, popsicles, Jell-O, custard, puddings, and any soft or mashed foods taken in small quantities can be swallowed fairly easily. Try to increase your fluid and food intake as the discomfort subsides. It is recommended that a child receive 1-1/2 quarts of fluid in a 24-hour period. Adult require twice this amount.  °Discomfort °Your sore throat may be relieved by applying an ice collar to your neck and/or by taking Tylenol®. You may experience an earache, which is due to referred pain from the throat. Referred ear pain is commonly felt at night when trying to rest. ° °Bleeding                        Although rare, there is risk of having some bleeding during the first 2 weeks after having a T&A. This usually happens between days 7-10 postoperatively. If you or your child should have any bleeding, try to remain calm. We recommend sitting up quietly in a chair and gently spitting out the blood into a bowl. For adults, gargling gently with ice water may help. If the bleeding does not stop after a short time (5 minutes), is more than 1 teaspoonful, or if you become worried, please call our office at [phone] or go directly to the nearest hospital emergency room. Do not eat or drink anything prior to going to the hospital as you may need to be taken to the operating room in order to control the bleeding. °GENERAL  CONSIDERATIONS °1. Brush your teeth regularly. Avoid mouthwashes and gargles for three weeks. You may gargle gently with warm salt-water as necessary or spray with Chloraseptic®. You may make salt-water by placing 2 teaspoons of table salt into a quart of fresh water. Warm the salt-water in a microwave to a luke warm temperature.  °2. Avoid exposure to colds and upper respiratory infections if possible.  °3. If you look into a mirror or into your child's mouth, you will see white-gray patches in the back of the throat. This is normal after having a T&A and is like a scab that forms on the skin after an abrasion. It will disappear once the back of the throat heals completely. However, it may cause a noticeable odor; this too will disappear with time. Again, warm salt-water gargles may be used to help keep the throat clean and promote healing.  °4. You may notice a temporary change in voice quality, such as a higher pitched voice or a nasal sound, until healing is complete. This may last for 1-2 weeks and should resolve.  °5. Do not take or give you child any medications that we have not prescribed or recommended.  °6. Snoring may occur, especially at night, for the first week after a T&A. It is due to swelling of the soft palate and will usually resolve.  °Please call our office at [phone] if you have any questions.   °

## 2020-08-02 NOTE — Anesthesia Preprocedure Evaluation (Signed)
Anesthesia Evaluation  Patient identified by MRN, date of birth, ID band Patient awake    Reviewed: Allergy & Precautions, NPO status , Patient's Chart, lab work & pertinent test results  Airway Mallampati: III  TM Distance: >3 FB Neck ROM: Full    Dental  (+) Teeth Intact   Pulmonary asthma , Current Smoker,    Pulmonary exam normal        Cardiovascular negative cardio ROS   Rhythm:Regular Rate:Normal     Neuro/Psych  Headaches, Depression    GI/Hepatic Neg liver ROS, GERD  ,  Endo/Other  negative endocrine ROS  Renal/GU negative Renal ROS   PCOS    Musculoskeletal  (+) Arthritis , Osteoarthritis,    Abdominal (+)  Abdomen: soft. Bowel sounds: normal.  Peds  Hematology negative hematology ROS (+)   Anesthesia Other Findings   Reproductive/Obstetrics                             Anesthesia Physical Anesthesia Plan  ASA: III  Anesthesia Plan: General   Post-op Pain Management:    Induction: Intravenous  PONV Risk Score and Plan: 2 and Ondansetron, Dexamethasone, Midazolam and Treatment may vary due to age or medical condition  Airway Management Planned: Mask and Oral ETT  Additional Equipment: None  Intra-op Plan:   Post-operative Plan: Extubation in OR  Informed Consent: I have reviewed the patients History and Physical, chart, labs and discussed the procedure including the risks, benefits and alternatives for the proposed anesthesia with the patient or authorized representative who has indicated his/her understanding and acceptance.     Dental advisory given  Plan Discussed with: CRNA  Anesthesia Plan Comments:        Anesthesia Quick Evaluation

## 2020-08-02 NOTE — H&P (Signed)
Cc: Recurrent tonsillitis  HPI: The patient is a 30 y/o female who presents today for evaluation of recurrent tonsillitis. The patient has noted issues with her tonsils for the past 6 months. She is experiencing frequent tonsillitis and tonsilloliths. The patient has taken several courses of antibiotics with only short term relief of her symptoms. She has been to the ER several times for treatment. The patient does note frequent halitosis. She is also noted to snore very loud. She is unsure of sleep apnea. She is otherwise healthy. Previous ENT surgery is denied.   The patient's review of systems (constitutional, eyes, ENT, cardiovascular, respiratory, GI, musculoskeletal, skin, neurologic, psychiatric, endocrine, hematologic, allergic) is noted in the ROS questionnaire.  It is reviewed with the patient.   Family health history: No HTN, DM, CAD, hearing loss or bleeding disorder.  Major events: None.  Ongoing medical problems: Obesity.  Social history: The patient is single. She smokes about 4 cigarettes a day. She denies the use of alcohol or illegal drugs.   Exam: General: Communicates without difficulty, well nourished, no acute distress. Head:  Normocephalic, no lesions or asymmetry. Eyes: PERRL, EOMI. No scleral icterus, conjunctivae clear.  Neuro: CN II exam reveals vision grossly intact.  No nystagmus at any point of gaze. Ears:  EAC normal without erythema AU.  TM intact without fluid and mobile AU. Nose: Moist, pink mucosa without lesions or mass. Mouth: Oral cavity clear and moist, no lesions, tonsils symmetric. Tonsils are 4+. Tonsils with mild erythema. Neck: Full range of motion, no lymphadenopathy or masses.   Assessment  1.  The patient's history and physical exam findings are consistent with chronic tonsillitis/pharyngitis secondary to adenotonsillar hypertrophy.  Plan  1. The treatment options include continuing conservative observation versus adenotonsillectomy.  Based on the  patient's history and physical exam findings, the patient will likely benefit from having the tonsils and adenoid removed.  The risks, benefits, alternatives, and details of the procedure are reviewed with the patient.  Questions are invited and answered.  2. The patient is interested in proceeding with the procedure.  We will schedule the procedure in accordance with the family schedule.

## 2020-08-03 ENCOUNTER — Encounter (HOSPITAL_COMMUNITY): Payer: Self-pay | Admitting: Otolaryngology

## 2020-08-03 LAB — POCT PREGNANCY, URINE: Preg Test, Ur: NEGATIVE

## 2020-08-03 LAB — SURGICAL PATHOLOGY

## 2021-03-05 DIAGNOSIS — E282 Polycystic ovarian syndrome: Secondary | ICD-10-CM | POA: Insufficient documentation

## 2021-03-08 IMAGING — CR PORTABLE CHEST - 1 VIEW
1 series · 2 of 2 positions shown · non-contrast
Comparison: January 10, 2012

CLINICAL DATA: Cough, sore throat and congestion for several days.

EXAM:
PORTABLE CHEST 1 VIEW

[Series 1: portable · 0.17mm/px · 2 of 2 slices shown]
[im 1/2]
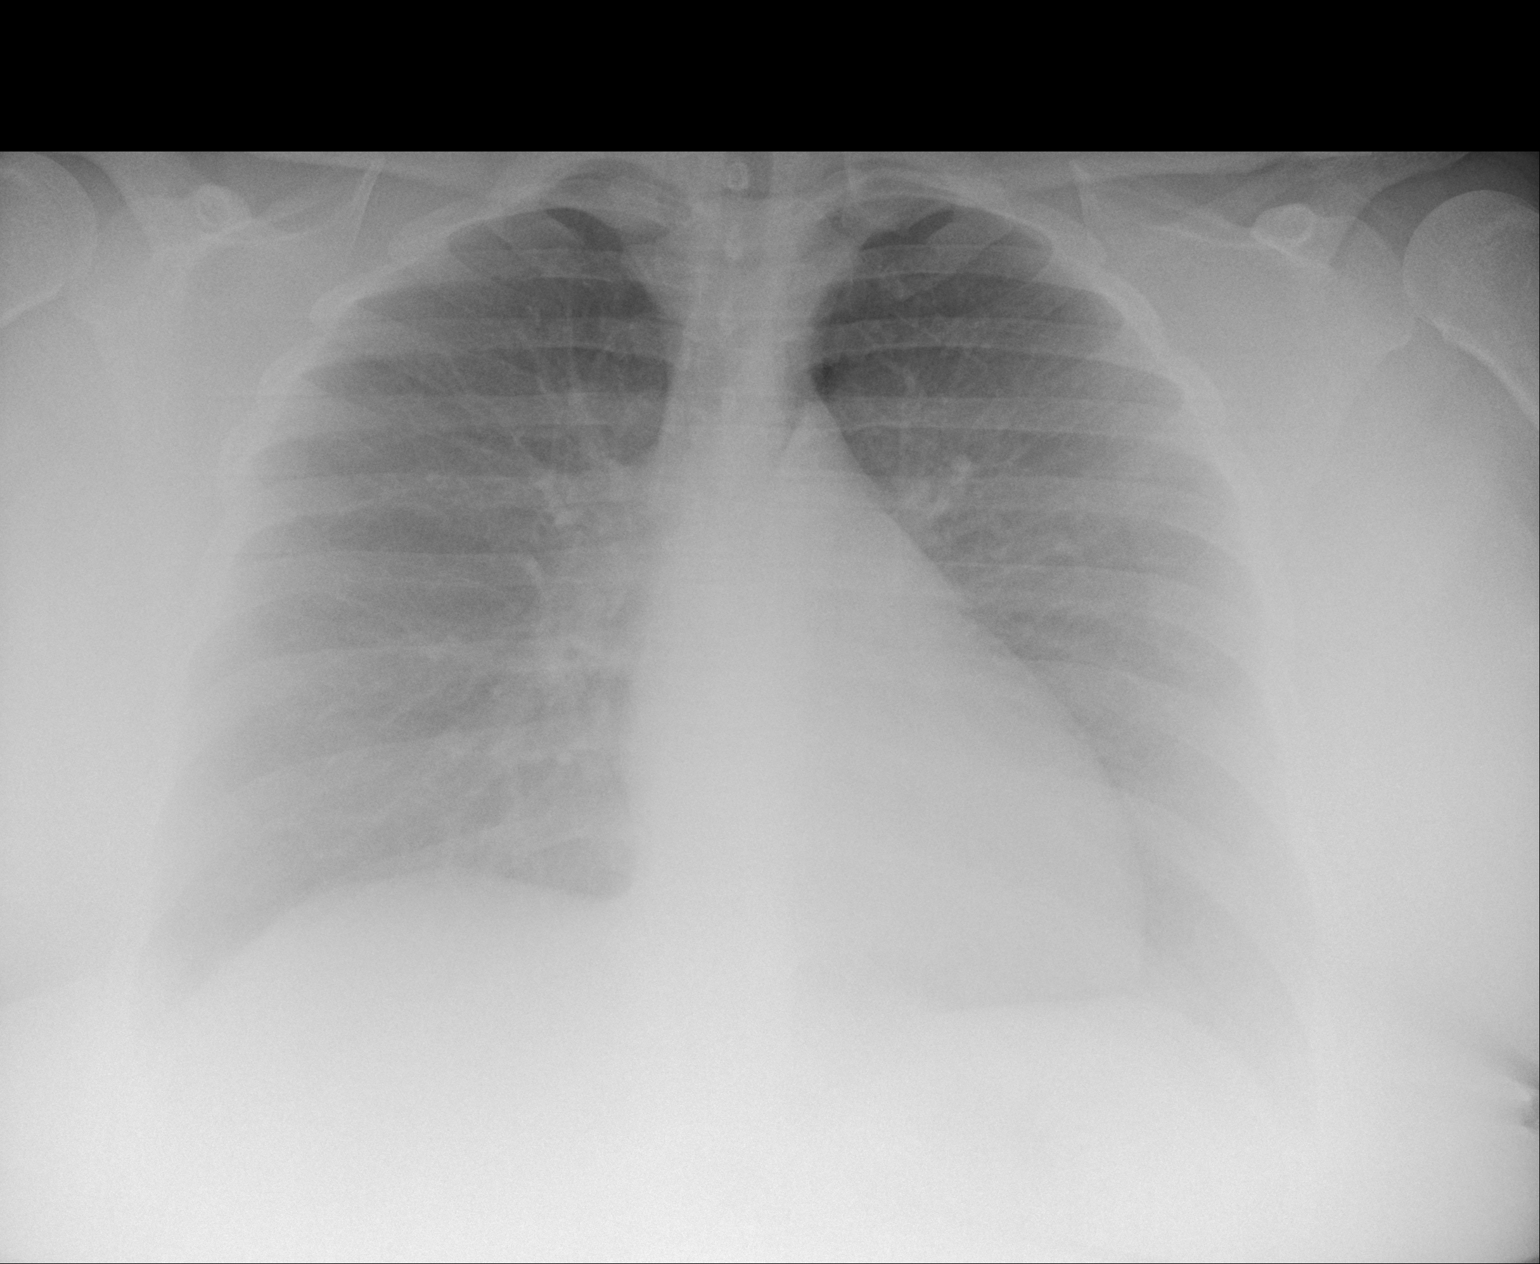
[im 2/2]
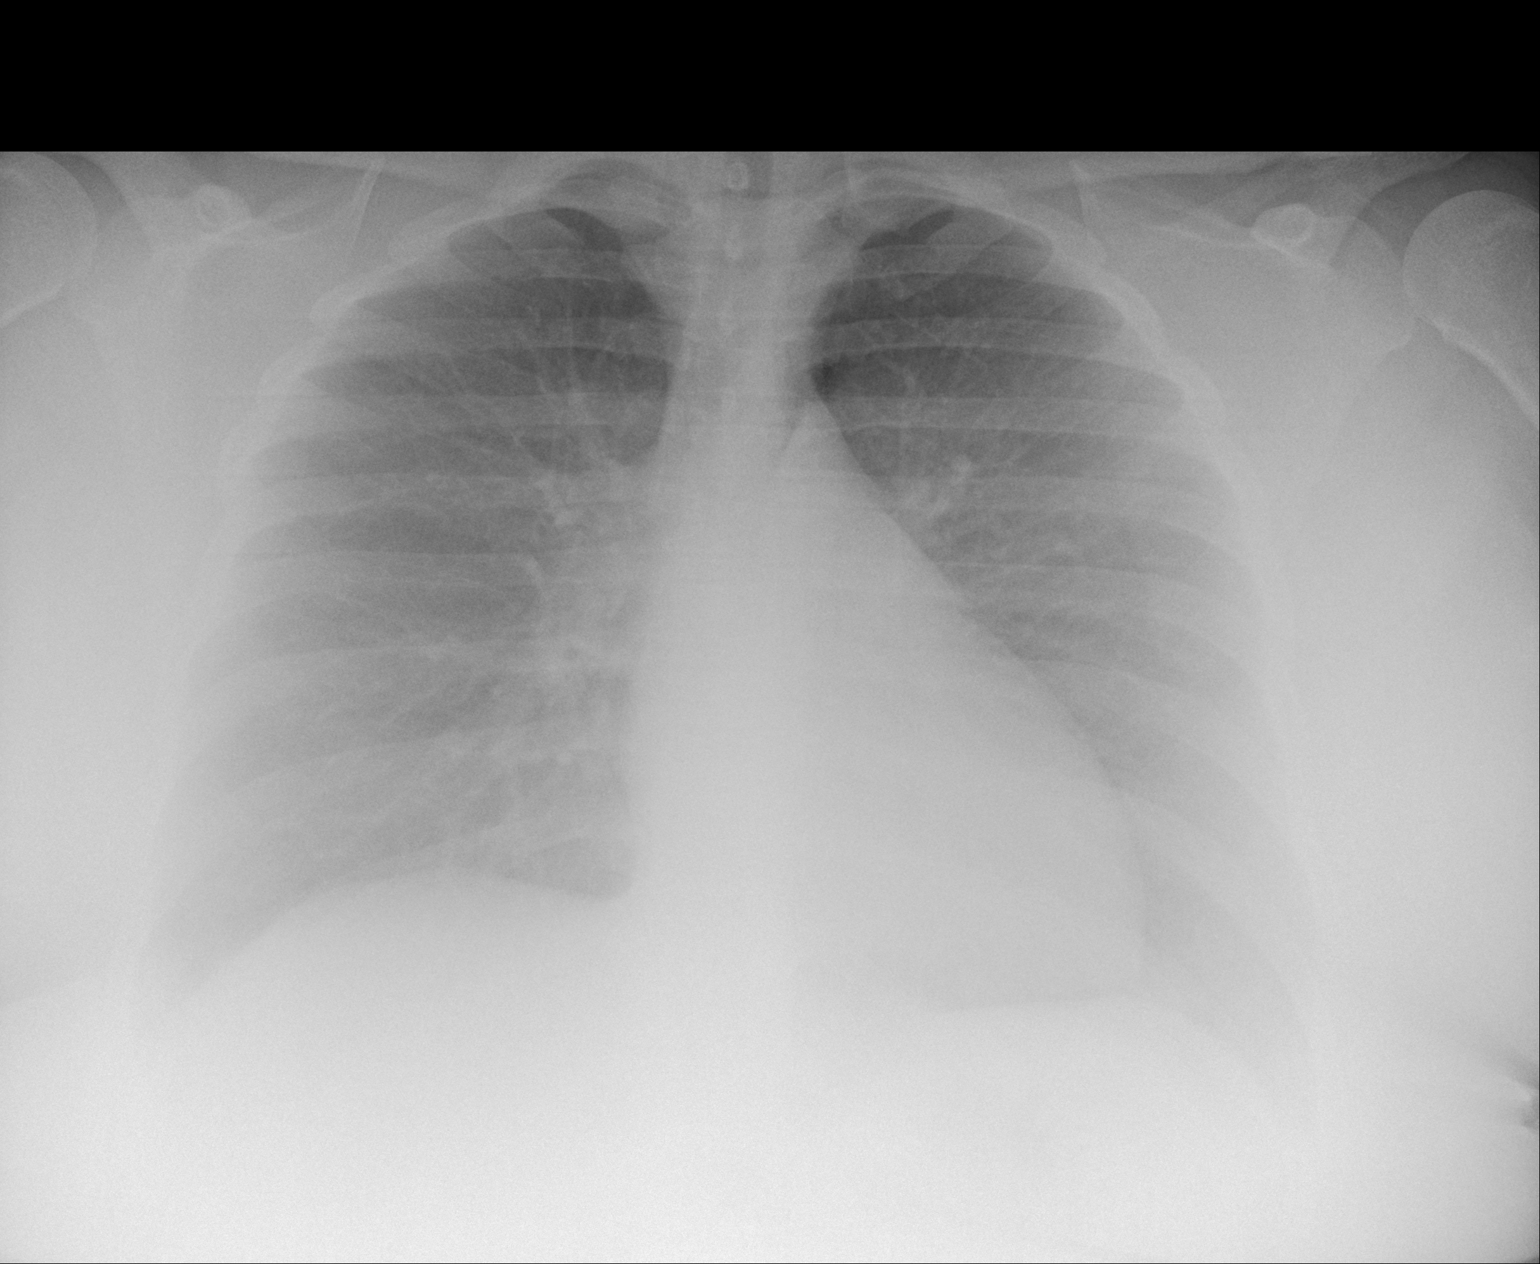

[2 of 2 positions shown; findings below may reference images not displayed]

FINDINGS: Cardiomediastinal silhouette is normal. Mediastinal contours appear
intact.

There is no evidence of focal airspace consolidation, pleural
effusion or pneumothorax.

Osseous structures are without acute abnormality. Soft tissues are
grossly normal.
IMPRESSION: No active disease.

## 2022-09-14 LAB — PANORAMA PRENATAL TEST FULL PANEL:PANORAMA TEST PLUS 5 ADDITIONAL MICRODELETIONS: FETAL FRACTION: 2.1

## 2023-09-01 ENCOUNTER — Inpatient Hospital Stay (HOSPITAL_COMMUNITY)
Admission: AD | Admit: 2023-09-01 | Discharge: 2023-09-01 | Disposition: A | Discharge status: Home / Self Care | Attending: Obstetrics & Gynecology | Admitting: Obstetrics & Gynecology

## 2023-09-01 ENCOUNTER — Inpatient Hospital Stay (HOSPITAL_COMMUNITY)

## 2023-09-01 ENCOUNTER — Other Ambulatory Visit: Payer: Self-pay

## 2023-09-01 ENCOUNTER — Encounter (HOSPITAL_COMMUNITY): Payer: Self-pay | Admitting: Obstetrics & Gynecology

## 2023-09-01 DIAGNOSIS — I10 Essential (primary) hypertension: Secondary | ICD-10-CM

## 2023-09-01 DIAGNOSIS — Z3A00 Weeks of gestation of pregnancy not specified: Secondary | ICD-10-CM | POA: Diagnosis not present

## 2023-09-01 DIAGNOSIS — O10911 Unspecified pre-existing hypertension complicating pregnancy, first trimester: Secondary | ICD-10-CM

## 2023-09-01 DIAGNOSIS — O3680X0 Pregnancy with inconclusive fetal viability, not applicable or unspecified: Secondary | ICD-10-CM | POA: Diagnosis not present

## 2023-09-01 DIAGNOSIS — Z6841 Body Mass Index (BMI) 40.0 and over, adult: Secondary | ICD-10-CM

## 2023-09-01 DIAGNOSIS — O99211 Obesity complicating pregnancy, first trimester: Secondary | ICD-10-CM

## 2023-09-01 DIAGNOSIS — R7989 Other specified abnormal findings of blood chemistry: Secondary | ICD-10-CM

## 2023-09-01 DIAGNOSIS — O26891 Other specified pregnancy related conditions, first trimester: Secondary | ICD-10-CM | POA: Diagnosis present

## 2023-09-01 DIAGNOSIS — R109 Unspecified abdominal pain: Secondary | ICD-10-CM | POA: Insufficient documentation

## 2023-09-01 DIAGNOSIS — O209 Hemorrhage in early pregnancy, unspecified: Secondary | ICD-10-CM | POA: Diagnosis present

## 2023-09-01 LAB — URINALYSIS, ROUTINE W REFLEX MICROSCOPIC
Bilirubin Urine: NEGATIVE
Glucose, UA: NEGATIVE mg/dL
Hgb urine dipstick: NEGATIVE
Ketones, ur: NEGATIVE mg/dL
Leukocytes,Ua: NEGATIVE
Nitrite: NEGATIVE
Protein, ur: 30 mg/dL — AB
Specific Gravity, Urine: 1.017 (ref 1.005–1.030)
pH: 5 (ref 5.0–8.0)

## 2023-09-01 LAB — CBC
HCT: 44.3 % (ref 36.0–46.0)
Hemoglobin: 14.6 g/dL (ref 12.0–15.0)
MCH: 28.6 pg (ref 26.0–34.0)
MCHC: 33 g/dL (ref 30.0–36.0)
MCV: 86.9 fL (ref 80.0–100.0)
Platelets: 341 10*3/uL (ref 150–400)
RBC: 5.1 MIL/uL (ref 3.87–5.11)
RDW: 14.2 % (ref 11.5–15.5)
WBC: 8.3 10*3/uL (ref 4.0–10.5)
nRBC: 0 % (ref 0.0–0.2)

## 2023-09-01 LAB — WET PREP, GENITAL
Clue Cells Wet Prep HPF POC: NONE SEEN
Sperm: NONE SEEN
Trich, Wet Prep: NONE SEEN
WBC, Wet Prep HPF POC: <10 (ref <10)
Yeast Wet Prep HPF POC: NONE SEEN

## 2023-09-01 LAB — HCG, QUANTITATIVE, PREGNANCY: hCG, Beta Chain, Quant, S: 200 m[IU]/mL — ABNORMAL HIGH (ref <5)

## 2023-09-01 LAB — POCT PREGNANCY, URINE: Preg Test, Ur: POSITIVE — AB

## 2023-09-01 MED ORDER — PRENATAL COMPLETE 14-0.4 MG PO TABS: 1.0000 | ORAL_TABLET | Freq: Every day | ORAL | 3 refills | Status: DC

## 2023-09-01 MED ORDER — NIFEDIPINE ER OSMOTIC RELEASE 30 MG PO TB24: 30.0000 mg | ORAL_TABLET | Freq: Every day | ORAL | 1 refills | Status: DC

## 2023-09-01 MED ORDER — ONDANSETRON HCL 4 MG PO TABS: 8.0000 mg | ORAL_TABLET | Freq: Two times a day (BID) | ORAL | 0 refills | Status: DC

## 2023-09-01 NOTE — Discharge Instructions (Signed)
[place] for [facility] at [facility]    Phone: [place] for [employer] [facility] at [place]   Phone: [place] for [employer] [facility] at [place]  Phone: [place] for [employer] at [facility]  Phone: [place] for [employer] [facility] at [place]  Phone: [place] for [place] at [facility]   Phone: [facility]       Phone: [phone]  [facility]    Phone: [phone]   [facility]    Phone: [phone]  [facility]    Phone: [place]    Phone: [phone]  [place] [facility]       Phone: [phone]   [place] [facility]  Phone: [place]    Phone: [phone]  [facility]   Phone: [phone]  [facility]      Phone: [phone]  [facility]    Phone: [phone]

## 2023-09-01 NOTE — MAU Note (Signed)
 MAU Triage Note  Teresa [name] is a 33 y.o. at Unknown here in MAU reporting: she's been having abdominal cramping for the past few days.  States had VB, but it has stopped.  Reports had +HPT.  LMP: 07/17/2023 approximate Onset of complaint: few days Pain score: 7 Vitals:   09/01/23 0933  BP: (!) 150/89  Pulse: 91  Resp: 18  Temp: 98.2 F (36.8 C)  SpO2: 98%     FHT: NA  Lab orders placed from triage: UPT & UA

## 2023-09-02 ENCOUNTER — Other Ambulatory Visit: Payer: Self-pay

## 2023-09-02 DIAGNOSIS — R7989 Other specified abnormal findings of blood chemistry: Secondary | ICD-10-CM

## 2023-09-02 LAB — ABO/RH: ABO/RH(D): O POS

## 2023-09-02 LAB — GC/CHLAMYDIA PROBE AMP (~~LOC~~) NOT AT ARMC
Chlamydia: NEGATIVE
Comment: NEGATIVE
Comment: NORMAL
Neisseria Gonorrhea: NEGATIVE

## 2023-09-02 NOTE — MAU Provider Note (Signed)
 S [name] is a 33 y.o. [id number] patient who presents to MAU today with complaint of vaginal bleeding which has resolved and abdominal pain and cramping. She is able to eat and drink and denies any N/V at this time. Offers no s/s of urinary discomfort and reports no vaginal itching/burning.  She has reported + HPT and UPT here is positive     O BP (!) 150/89 (BP Location: Right Arm)   Pulse 91   Temp 98.2 F (36.8 C) (Oral)   Resp 18   Ht 5\' 2"  (1.575 m)   Wt (!) 142.2 kg   LMP 07/17/2023 (Approximate)   SpO2 98%   BMI 57.34 kg/m  Physical Exam Vitals and nursing note reviewed.  Constitutional:      Appearance: Normal appearance. She is obese. She is not ill-appearing.  HENT:     Head: Normocephalic.     Nose: Nose normal.  Cardiovascular:     Rate and Rhythm: Normal rate.  Pulmonary:     Effort: Pulmonary effort is normal.  Abdominal:     Palpations: Abdomen is soft.  Musculoskeletal:        General: Normal range of motion.     Cervical back: Normal range of motion.  Skin:    General: Skin is warm.  Neurological:     Mental Status: She is alert and oriented to [place], place, and time.  Psychiatric:        Mood and Affect: Mood normal.        Behavior: Behavior normal.      MDM  HIGH  Vaginal bleeding in early pregnancy/ Hypertension CBC HCG Quant ABO OB Ultrasound Swabs UA   Differential diagnosis considered for 1st trimester vaginal bleeding includes but is not limited to: ectopic pregnancy, complete spontaneous abortion, incomplete abortion, missed abortion, threatened abortion, embryonic/fetal demise, cervical insufficiency, cervical or vaginal disorder     Orders Placed This Encounter  Procedures   Wet prep, genital    Standing Status:   Standing    Number of Occurrences:   1   US  OB LESS THAN 14 WEEKS WITH OB TRANSVAGINAL    Standing Status:   Standing    Number of Occurrences:   1    Symptom/Reason for Exam:   Abdominal pain  [[id number]]   Urinalysis, Routine w reflex microscopic -Urine, Clean Catch    Standing Status:   Standing    Number of Occurrences:   1    Specimen Source:   Urine, Clean Catch [76]   CBC    Standing Status:   Standing    Number of Occurrences:   1   hCG, quantitative, pregnancy    Standing Status:   Standing    Number of Occurrences:   1   Pregnancy, urine POC    Standing Status:   Standing    Number of Occurrences:   1   ABO/Rh    Standing Status:   Standing    Number of Occurrences:   1   Discharge patient Discharge disposition: 01-Home or Self Care; Discharge patient date: 09/01/2023    Standing Status:   Standing    Number of Occurrences:   1    Discharge disposition:   01-Home or Self Care [1]    Discharge patient date:   09/01/2023   Discharge patient Discharge disposition: 01-Home or Self Care; Discharge patient date: 09/01/2023    Standing Status:   Standing    Number of Occurrences:  1    Discharge disposition:   01-Home or Self Care [1]    Discharge patient date:   09/01/2023      Recent Results (from the past 2160 hours)  Pregnancy, urine POC     Status: Abnormal   Collection Time: 09/01/23  9:22 AM  Result Value Ref Range   Preg Test, Ur POSITIVE (A) NEGATIVE    Comment:        THE SENSITIVITY OF THIS METHODOLOGY IS >24 mIU/mL   Wet prep, genital     Status: None   Collection Time: 09/01/23  9:31 AM  Result Value Ref Range   Yeast Wet Prep HPF POC NONE SEEN NONE SEEN   Trich, Wet Prep NONE SEEN NONE SEEN   Clue Cells Wet Prep HPF POC NONE SEEN NONE SEEN   WBC, Wet Prep HPF POC <10 <10   Sperm NONE SEEN     Comment: Performed at [facility], [address], [place], [place] [address]  GC/Chlamydia probe amp ([place])not at [facility]     Status: None   Collection Time: 09/01/23  9:31 AM  Result Value Ref Range   Neisseria Gonorrhea Negative    Chlamydia Negative    Comment Normal Reference Ranger Chlamydia - Negative    Comment      Normal Reference Range  Neisseria Gonorrhea - Negative  Urinalysis, Routine w reflex microscopic -Urine, Clean Catch     Status: Abnormal   Collection Time: 09/01/23  9:31 AM  Result Value Ref Range   Color, Urine YELLOW YELLOW   APPearance HAZY (A) CLEAR   Specific Gravity, Urine 1.017 1.005 - 1.030   pH 5.0 5.0 - 8.0   Glucose, UA NEGATIVE NEGATIVE mg/dL   Hgb urine dipstick NEGATIVE NEGATIVE   Bilirubin Urine NEGATIVE NEGATIVE   Ketones, ur NEGATIVE NEGATIVE mg/dL   Protein, ur 30 (A) NEGATIVE mg/dL   Nitrite NEGATIVE NEGATIVE   Leukocytes,Ua NEGATIVE NEGATIVE   RBC / HPF 0-5 0 - 5 RBC/hpf   WBC, UA 0-5 0 - 5 WBC/hpf   Bacteria, UA RARE (A) NONE SEEN   Squamous Epithelial / HPF 0-5 0 - 5 /HPF   Mucus PRESENT     Comment: Performed at [facility], [address] [place], [place] [address]  CBC     Status: None   Collection Time: 09/01/23  9:50 AM  Result Value Ref Range   WBC 8.3 4.0 - 10.5 K/uL   RBC 5.10 3.87 - 5.11 MIL/uL   Hemoglobin 14.6 12.0 - 15.0 g/dL   HCT 13.0 86.5 - 78.4 %   MCV 86.9 80.0 - 100.0 fL   MCH 28.6 26.0 - 34.0 pg   MCHC 33.0 30.0 - 36.0 g/dL   RDW 69.6 29.5 - 28.4 %   Platelets 341 150 - 400 K/uL   nRBC 0.0 0.0 - 0.2 %    Comment: Performed at [facility], [address], [place], [place] [address]  ABO/Rh     Status: None   Collection Time: 09/01/23  9:50 AM  Result Value Ref Range   ABO/RH(D) O POS    No rh immune globuloin      NOT A RH IMMUNE GLOBULIN CANDIDATE, PT RH POSITIVE Performed at [facility], [address], [place], [place] [address]   hCG, quantitative, pregnancy     Status: Abnormal   Collection Time: 09/01/23  9:50 AM  Result Value Ref Range  hCG, Beta Chain, Quant, S 200 (H) <5 mIU/mL    Comment:          GEST. AGE      CONC.  (mIU/mL)   <=1 WEEK        5 - 50     2 WEEKS       50 - 500     3 WEEKS       100 - 10,000     4 WEEKS     1,000 - 30,000     5 WEEKS     3,500 - 115,000   6-8 WEEKS     12,000 - 270,000    12  WEEKS     15,000 - 220,000        FEMALE AND NON-PREGNANT FEMALE:     LESS THAN 5 mIU/mL Performed at [facility], [address], [place], [place] [address]      Study Result  Narrative & Impression  CLINICAL DATA:  [phone] Abdominal pain [phone].   EXAM: OBSTETRIC <14 WK US  AND TRANSVAGINAL OB US    TECHNIQUE: Both transabdominal and transvaginal ultrasound examinations were performed for complete evaluation of the gestation as well as the maternal uterus, adnexal regions, and pelvic cul-de-sac. Transvaginal technique was performed to assess early pregnancy.   COMPARISON:  None Available.   FINDINGS: Intrauterine gestational sac: None.   Maternal uterus/adnexae: Normal-size anteverted uterus. No intrauterine gestational sac. No uterine mass seen.   Bilateral ovaries are visualized and appears within normal limits. No large adnexal mass.   IMPRESSION: *No intrauterine gestation is seen and no adnexal mass is evident. These findings represent a pregnancy of unknown location. The sonographic differential diagnosis includes: an intrauterine gestation too early to visualize, a spontaneous abortion, or an occult ectopic pregnancy. Continued close clinical follow-up, serial serum beta-HCG levels and short term sonographic follow up in follow up in 7-14 days or earlier if clinically indicated, is recommended.     Electronically Signed   By: Beula Brunswick M.D.   On: 09/01/2023 10:25    I have reviewed the patient chart and performed the physical exam . I have ordered & interpreted the lab results and reviewed and interpreted the ultrasound images  Medications ordered as stated below.  A/P as described below.  Counseling and education provided and patient agreeable  with plan as described below. Verbalized understanding.    ASSESSMENT Medical screening exam complete  1. Elevated serum hCG (Primary)  2. Chronic hypertension  3. BMI 50.0-59.9, adult (HCC)    -  Bhcg @ 200 - Will need 48 hour follow up and appointment scheduled  - RX for Procardia sent for [facility] Future Appointments  Date Time Provider Department Center  09/03/2023  1:30 PM [facility]    Allergies as of 09/01/2023       Reactions   Other Anaphylaxis   Grapes, strawberries, bananas, pineapple, oranges, nuts   Pineapple Anaphylaxis, Swelling   Betadine [povidone Iodine] Itching   Betadine causes itching and skin redness and irritation   Iodine Itching   Latex Itching   Pollen Extract Itching   Sulfa  Antibiotics Itching   Tramadol  Nausea And Vomiting   Povidone-iodine Itching, Rash   Tomato Itching, Rash        Medication List     STOP taking these medications    amoxicillin  500 MG capsule Commonly known as: AMOXIL    calcium carbonate 750 MG chewable tablet Commonly known  as: TUMS EX   cetirizine 10 MG tablet Commonly known as: ZYRTEC   levonorgestrel 20 MCG/24HR IUD Commonly known as: MIRENA       TAKE these medications    NIFEdipine 30 MG 24 hr tablet Commonly known as: PROCARDIA-XL/NIFEDICAL-XL Take 1 tablet (30 mg total) by mouth daily.   ondansetron  4 MG tablet Commonly known as: Zofran  Take 2 tablets (8 mg total) by mouth 2 (two) times daily.   Prenatal Complete 14-0.4 MG Tabs Take 1 tablet by mouth daily.        Discharge from MAU in stable condition  See AVS for full description of educational information and instructions provided to the patient at time of discharge  List of options for follow-up given   Warning signs for worsening condition that would warrant emergency follow-up discussed Patient may return to MAU as needed   Debbe Fail, MSN, [facility], [facility] [employer]

## 2023-09-03 ENCOUNTER — Other Ambulatory Visit: Payer: Self-pay

## 2023-09-03 DIAGNOSIS — R7989 Other specified abnormal findings of blood chemistry: Secondary | ICD-10-CM

## 2023-09-04 ENCOUNTER — Telehealth: Payer: Self-pay

## 2023-09-04 ENCOUNTER — Inpatient Hospital Stay (HOSPITAL_COMMUNITY)
Admission: AD | Admit: 2023-09-04 | Discharge: 2023-09-04 | Disposition: A | Discharge status: Home / Self Care | Payer: Self-pay | Attending: Obstetrics and Gynecology | Admitting: Obstetrics and Gynecology

## 2023-09-04 DIAGNOSIS — O26899 Other specified pregnancy related conditions, unspecified trimester: Secondary | ICD-10-CM

## 2023-09-04 DIAGNOSIS — R102 Pelvic and perineal pain: Secondary | ICD-10-CM | POA: Diagnosis present

## 2023-09-04 DIAGNOSIS — M549 Dorsalgia, unspecified: Secondary | ICD-10-CM | POA: Diagnosis not present

## 2023-09-04 DIAGNOSIS — R109 Unspecified abdominal pain: Secondary | ICD-10-CM

## 2023-09-04 DIAGNOSIS — Z3A01 Less than 8 weeks gestation of pregnancy: Secondary | ICD-10-CM

## 2023-09-04 DIAGNOSIS — O26891 Other specified pregnancy related conditions, first trimester: Secondary | ICD-10-CM | POA: Diagnosis not present

## 2023-09-04 DIAGNOSIS — M545 Low back pain, unspecified: Secondary | ICD-10-CM | POA: Diagnosis not present

## 2023-09-04 DIAGNOSIS — O3680X0 Pregnancy with inconclusive fetal viability, not applicable or unspecified: Secondary | ICD-10-CM | POA: Diagnosis not present

## 2023-09-04 LAB — HCG, QUANTITATIVE, PREGNANCY: hCG, Beta Chain, Quant, S: 483 m[IU]/mL — ABNORMAL HIGH (ref <5)

## 2023-09-04 LAB — BETA HCG QUANT (REF LAB): hCG Quant: 304 m[IU]/mL

## 2023-09-04 MED ORDER — ACETAMINOPHEN 325 MG PO TABS: 650.0000 mg | ORAL_TABLET | Freq: Once | ORAL | Status: DC

## 2023-09-04 NOTE — Telephone Encounter (Addendum)
 Called patient. Patient verified name and DOB. CMA called patient to inform her we want her to go to MAU on Saturday to get her Beta HCG lab done due to her rising Beta HCG level.  Patient is also scheduled for Monday for nurse visit if she decided not to go to MAU Saturday. CMA informed patient to watch for precautious sign for severe pain and bleeding, and if she experience those symptoms, she needs to head to MAU.  Patient understood.   Second Call: CMA called patient again to ask her if she would like to come tomorrow to get her level draw here at the clinic. No answer and LVM.   Honore Lux CMA

## 2023-09-04 NOTE — MAU Note (Addendum)
 Teresa [name] is a 34 y.o. at [redacted]w[redacted]d here in MAU reporting: was here the other day.  Having back pain and pain in lower abd. No bleeding.  Feels like her period is trying to come on really really bad. Has not taken anything. Onset of complaint: this morning Pain score: abd 8 Vitals:   09/04/23 1834  BP: (!) 142/76  Pulse: 85  Resp: 18  Temp: 98.6 F (37 C)  SpO2: 99%      Lab orders placed from triage:  urine obtained    Has chips, candy, slim jim and a drink while in lobby

## 2023-09-04 NOTE — MAU Provider Note (Signed)
 Chief Complaint: Abdominal Pain and Back Pain   Event Date/Time   First Provider Initiated Contact with Patient 09/04/23 2030        SUBJECTIVE HPI: Teresa [name] is a 33 y.o. [id number] at [redacted]w[redacted]d by LMP who presents to maternity admissions reporting back pain and cramping.  Was seen here on 5/5 and HCG level was 200.  Had another HCG done yesterday which was 304.  Initial US  did not see a gestational sac. She denies vaginal bleeding, n/v, or fever/chills.     Abdominal Pain This is a recurrent problem. The pain is located in the suprapubic region. The quality of the pain is cramping. Pertinent negatives include no constipation or diarrhea. Nothing aggravates the pain. The pain is relieved by Nothing. She has tried nothing for the symptoms.   RN Note:    Teresa [name] is a 33 y.o. at [redacted]w[redacted]d here in MAU reporting: was here the other day.  Having back pain and pain in lower abd. No bleeding.  Feels like her period is trying to come on really really bad. Has not taken anything. Onset of complaint: this morning Pain score: abd 8      Past Medical History:  Diagnosis Date   Abnormal Pap smear    Acid reflux    ADHD (attention deficit hyperactivity disorder)    Asthma    childhood /grew out of it   Chlamydia    Degenerative disk disease    Depression    post partem   Fractured fibula    left   History of PCOS    Migraine    Trichomonas    Urinary tract infection    Vaginal Pap smear, abnormal    normal since   Past Surgical History:  Procedure Laterality Date   DILATION AND CURETTAGE OF UTERUS     INDUCED ABORTION     TONSILLECTOMY AND ADENOIDECTOMY N/A 08/02/2020   Procedure: TONSILLECTOMY AND ADENOIDECTOMY;  Surgeon: Reynold Caves, MD;  Location: [facility] OR;  Service: ENT;  Laterality: N/A;   VAGINAL DELIVERY  2013   WISDOM TOOTH EXTRACTION     Social History   Socioeconomic History   Marital status: Single    Spouse name: Not on file   Number of children: Not on file    Years of education: Not on file   Highest education level: Not on file  Occupational History   Not on file  Tobacco Use   Smoking status: Every Day    Current packs/day: 0.50    Average packs/day: 0.5 packs/day for 7.0 years (3.5 ttl pk-yrs)    Types: Cigarettes   Smokeless tobacco: Never  Vaping Use   Vaping status: Never Used  Substance and Sexual Activity   Alcohol use: Yes    Alcohol/week: 1.0 standard drink of alcohol    Types: 1 Shots of liquor per week    Comment: occ but was a heavy user in 2015 due to boyfriend passed away.   Drug use: No    Types: Marijuana    Comment: last use was April 2016   Sexual activity: Yes    Birth control/protection: Condom    Comment: last intercourse 3 weeks ago  Other Topics Concern   Not on file  Social History Narrative   Not on file   Social Drivers of Health   Financial Resource Strain: Not on file  Food Insecurity: No Food Insecurity (07/26/2022)   Received from [facility]   Hunger Vital  Sign    Worried About Programme researcher, broadcasting/film/video in the Last Year: Never true    Ran Out of Food in the Last Year: Never true  Transportation Needs: No Transportation Needs (07/26/2022)   Received from [facility] - Transportation    In the past 12 months, has lack of transportation kept you from medical appointments or from getting medications?: No    Lack of Transportation (Non-Medical): No  Physical Activity: Not on file  Stress: No Stress Concern Present (03/05/2021)   Received from [facility] - Occupational Stress Questionnaire    Feeling of Stress : Not at all  Social Connections: Not on file  Intimate Partner Violence: Not on file   No current facility-administered medications on file prior to encounter.   Current Outpatient Medications on File Prior to Encounter  Medication Sig Dispense Refill   NIFEdipine  (PROCARDIA-XL/NIFEDICAL-XL) 30 MG 24 hr tablet Take 1 tablet (30 mg total) by mouth daily. 30 tablet 1   ondansetron  (ZOFRAN ) 4 MG tablet Take 2 tablets (8 mg total) by mouth 2 (two) times daily. 20 tablet 0   Prenatal Vit-Fe Fumarate-FA (PRENATAL COMPLETE) 14-0.4 MG TABS Take 1 tablet by mouth daily. 60 tablet 3   Allergies  Allergen Reactions   Other Anaphylaxis    Grapes, strawberries, bananas, pineapple, oranges, nuts   Pineapple Anaphylaxis and Swelling   Betadine [Povidone Iodine] Itching    Betadine causes itching and skin redness and irritation   Iodine Itching   Latex Itching   Pollen Extract Itching   Sulfa  Antibiotics Itching   Tramadol  Nausea And Vomiting   Povidone-Iodine Itching and Rash   Tomato Itching and Rash    I have reviewed patient's Past Medical Hx, Surgical Hx, Family Hx, Social Hx, medications and allergies.   ROS:  Review of Systems  Gastrointestinal:  Positive for abdominal pain. Negative for constipation and diarrhea.   Review of Systems  Other systems negative   Physical Exam  Physical Exam Patient Vitals for the past 24 hrs:  BP Temp Temp src Pulse Resp SpO2 Height Weight  09/04/23 1834 (!) 142/76 98.6 F (37 C) Oral 85 18 99 % 5\' 2"  (1.575 m) (!) 144.3 kg   Constitutional: Well-developed, well-nourished female in no acute distress.  Cardiovascular: normal rate Respiratory: normal effort GI: Abd soft, non-tender. Pos BS x 4 MS: Extremities nontender, no edema, normal ROM Neurologic: Alert and oriented x 4.   PELVIC EXAM: deferred  LAB RESULTS Results for orders placed or performed during the hospital encounter of 09/04/23 (from the past 24 hours)  hCG, quantitative, pregnancy     Status: Abnormal   Collection Time: 09/04/23  7:27 PM  Result Value Ref Range   hCG, Beta Chain, Quant, S 483 (H) <5 mIU/mL    --/--/O POS (05/05 0950)  IMAGING   MAU Management/MDM: I have reviewed the triage vital signs and the nursing notes.    Pertinent labs & imaging results that were available during my care of the patient were reviewed by me and considered in my medical decision making (see chart for details).      I have reviewed her medical records including past results, notes and treatments. Medical, Surgical, and family history were reviewed.  Medications and recent lab tests were reviewed  Ordered repeat HCG level  It did double from the initial level.   200 > 304 > 483  ASSESSMENT Pregnancy at [redacted]w[redacted]d by LMP Pelvic and low back pain Pregnancy uknown location  PLAN Discharge home Plan to repeat Ultrasound in about 7-10 days  Ectopic precautions  Pt stable at time of discharge. Encouraged to return here if she develops worsening of symptoms, increase in pain, fever, or other concerning symptoms.    Holmes Lusher CNM, MSN Certified Nurse-Midwife 09/04/2023  8:30 PM

## 2023-09-05 ENCOUNTER — Ambulatory Visit

## 2023-09-08 ENCOUNTER — Ambulatory Visit

## 2023-09-08 ENCOUNTER — Ambulatory Visit (INDEPENDENT_AMBULATORY_CARE_PROVIDER_SITE_OTHER)

## 2023-09-08 ENCOUNTER — Other Ambulatory Visit: Payer: Self-pay

## 2023-09-08 VITALS — BP 135/83 | HR 96 | Ht 62.0 in | Wt 316.0 lb

## 2023-09-08 DIAGNOSIS — Z3A01 Less than 8 weeks gestation of pregnancy: Secondary | ICD-10-CM

## 2023-09-08 DIAGNOSIS — O3680X0 Pregnancy with inconclusive fetal viability, not applicable or unspecified: Secondary | ICD-10-CM

## 2023-09-08 LAB — BETA HCG QUANT (REF LAB): hCG Quant: 1665 m[IU]/mL

## 2023-09-08 NOTE — Progress Notes (Signed)
 Pt here today for STAT Beta.  Pt denies VB and pain.  Pt advised that we will call by end of day with results and f/u.  Pt verbalized understanding.    Received lab results for beta level of 1665 which increased from 304.  Reviewed chart with Cresenzo, MD and provider recommendation to schedule f/u US  in two weeks.  Pt informed of provider's recommendation and patients US  on 09/15/23 rescheduled for 09/29/23 at 1015.  Pt advised when to go to MAU for evaluation.  Pt verbalized understanding with no further questions.   Adrina Armijo,RN  09/08/23

## 2023-09-14 ENCOUNTER — Inpatient Hospital Stay (HOSPITAL_COMMUNITY)
Admission: AD | Admit: 2023-09-14 | Discharge: 2023-09-15 | Disposition: A | Discharge status: Home / Self Care | Payer: Self-pay | Attending: Obstetrics and Gynecology | Admitting: Obstetrics and Gynecology

## 2023-09-14 ENCOUNTER — Other Ambulatory Visit: Payer: Self-pay

## 2023-09-14 DIAGNOSIS — O99891 Other specified diseases and conditions complicating pregnancy: Secondary | ICD-10-CM | POA: Diagnosis not present

## 2023-09-14 DIAGNOSIS — O368310 Maternal care for abnormalities of the fetal heart rate or rhythm, first trimester, not applicable or unspecified: Secondary | ICD-10-CM | POA: Diagnosis not present

## 2023-09-14 DIAGNOSIS — Z3A01 Less than 8 weeks gestation of pregnancy: Secondary | ICD-10-CM | POA: Diagnosis not present

## 2023-09-14 DIAGNOSIS — M549 Dorsalgia, unspecified: Secondary | ICD-10-CM | POA: Diagnosis not present

## 2023-09-14 DIAGNOSIS — O208 Other hemorrhage in early pregnancy: Secondary | ICD-10-CM | POA: Diagnosis not present

## 2023-09-14 NOTE — MAU Note (Addendum)
 Teresa [name] is a 33 y.o. at [redacted]w[redacted]d here in MAU reporting: left lower back pain - was coming and going but constant for the past 1-2 hours. Reports pink spotting for the past 4 days.  Last intercourse this morning. Took extra strength tylenol  at 1915  Onset of complaint: 2200 Pain score: 7 Vitals:   09/14/23 2332  BP: (!) 139/99  Pulse: 95  Resp: 19  Temp: 97.9 F (36.6 C)  SpO2: 99%     FHT: NA  Lab orders placed from triage: UA

## 2023-09-15 ENCOUNTER — Other Ambulatory Visit

## 2023-09-15 ENCOUNTER — Inpatient Hospital Stay (HOSPITAL_COMMUNITY)

## 2023-09-15 DIAGNOSIS — Z3A01 Less than 8 weeks gestation of pregnancy: Secondary | ICD-10-CM

## 2023-09-15 DIAGNOSIS — M549 Dorsalgia, unspecified: Secondary | ICD-10-CM

## 2023-09-15 DIAGNOSIS — O99891 Other specified diseases and conditions complicating pregnancy: Secondary | ICD-10-CM

## 2023-09-15 LAB — URINALYSIS, ROUTINE W REFLEX MICROSCOPIC
Bacteria, UA: NONE SEEN
Bilirubin Urine: NEGATIVE
Glucose, UA: NEGATIVE mg/dL
Ketones, ur: NEGATIVE mg/dL
Leukocytes,Ua: NEGATIVE
Nitrite: NEGATIVE
Protein, ur: 100 mg/dL — AB
Specific Gravity, Urine: 1.025 (ref 1.005–1.030)
pH: 5 (ref 5.0–8.0)

## 2023-09-15 LAB — HCG, QUANTITATIVE, PREGNANCY: hCG, Beta Chain, Quant, S: 13417 m[IU]/mL — ABNORMAL HIGH (ref <5)

## 2023-09-15 MED ORDER — ACETAMINOPHEN 500 MG PO TABS
1000.0000 mg | ORAL_TABLET | Freq: Once | ORAL | Status: AC
  Administered 2023-09-15: 1000 mg via ORAL
  Filled 2023-09-15: qty 2

## 2023-09-15 MED ORDER — CYCLOBENZAPRINE HCL 5 MG PO TABS
10.0000 mg | ORAL_TABLET | Freq: Once | ORAL | Status: AC
  Administered 2023-09-15: 10 mg via ORAL
  Filled 2023-09-15: qty 2

## 2023-09-15 NOTE — MAU Provider Note (Signed)
 History     CSN: [id number]  Arrival date and time: 09/14/23 2320   Event Date/Time   First Provider Initiated Contact with Patient 09/15/23 0227      Chief Complaint  Patient presents with   Back Pain    Teresa [name] is a 33 y.o. [id number] at [redacted]w[redacted]d by US  today.  She presents today for back pain.  She reports she has h/o sciatica nerve pain (left side), "but feels it shifted. "  She states the pain is located in her lower back and was initially intermittent, but is now constant.  She rates the pain a 7/10 and reports taking tylenol  at 1915.  Patient denies vaginal concerns including bleeding and discharge.  No abdominal cramping.  No issues with urination, constipation, or diarrhea. Patient reports h/o HTN, but has not taken medication today.   OB History     Gravida  4   Para  1   Term  1   Preterm  0   AB  2   Living  1      SAB  1   IAB  1   Ectopic  0   Multiple  0   Live Births  1           Past Medical History:  Diagnosis Date   Abnormal Pap smear    Acid reflux    ADHD (attention deficit hyperactivity disorder)    Asthma    childhood /grew out of it   Chlamydia    Degenerative disk disease    Depression    post partem   Fractured fibula    left   History of PCOS    Migraine    Trichomonas    Urinary tract infection    Vaginal Pap smear, abnormal    normal since    Past Surgical History:  Procedure Laterality Date   DILATION AND CURETTAGE OF UTERUS     INDUCED ABORTION     TONSILLECTOMY AND ADENOIDECTOMY N/A 08/02/2020   Procedure: TONSILLECTOMY AND ADENOIDECTOMY;  Surgeon: Reynold Caves, MD;  Location: [facility];  Service: ENT;  Laterality: N/A;   VAGINAL DELIVERY  2013   WISDOM TOOTH EXTRACTION      Family History  Problem Relation Age of Onset   Diabetes Mother    Hypertension Mother    Heart disease Mother    Diabetes Maternal Grandfather    Heart disease Maternal Grandmother        chf   Anesthesia problems Neg Hx     Hypotension Neg Hx    Malignant hyperthermia Neg Hx    Pseudochol deficiency Neg Hx     Social History   Tobacco Use   Smoking status: Every Day    Current packs/day: 0.50    Average packs/day: 0.5 packs/day for 7.0 years (3.5 ttl pk-yrs)    Types: Cigarettes   Smokeless tobacco: Never  Vaping Use   Vaping status: Never Used  Substance Use Topics   Alcohol use: Yes    Alcohol/week: 1.0 standard drink of alcohol    Types: 1 Shots of liquor per week    Comment: occ but was a heavy user in 2015 due to boyfriend passed away.   Drug use: No    Types: Marijuana    Comment: last use was April 2016    Allergies:  Allergies  Allergen Reactions   Other Anaphylaxis    Grapes, strawberries, bananas, pineapple, oranges, nuts   Pineapple Anaphylaxis and Swelling  Betadine [Povidone Iodine] Itching    Betadine causes itching and skin redness and irritation   Iodine Itching   Latex Itching   Pollen Extract Itching   Sulfa  Antibiotics Itching   Tramadol  Nausea And Vomiting   Povidone-Iodine Itching and Rash   Tomato Itching and Rash    Medications Prior to Admission  Medication Sig Dispense Refill Last Dose/Taking   NIFEdipine  (PROCARDIA -XL/NIFEDICAL-XL) 30 MG 24 hr tablet Take 1 tablet (30 mg total) by mouth daily. 30 tablet 1    ondansetron  (ZOFRAN ) 4 MG tablet Take 2 tablets (8 mg total) by mouth 2 (two) times daily. (Patient not taking: Reported on 09/08/2023) 20 tablet 0    Prenatal Vit-Fe Fumarate-FA (PRENATAL COMPLETE) 14-0.4 MG TABS Take 1 tablet by mouth daily. 60 tablet 3     Review of Systems  Constitutional:  Negative for chills and fever.  Respiratory:  Negative for shortness of breath.   Gastrointestinal:  Negative for abdominal pain, nausea and vomiting.  Genitourinary:  Negative for difficulty urinating, dysuria, vaginal bleeding and vaginal discharge.  Musculoskeletal:  Positive for back pain.   Physical Exam   Blood pressure (!) 139/99, pulse 95, temperature  97.9 F (36.6 C), temperature source Oral, resp. rate 19, height 5\' 2"  (1.575 m), weight (!) 145.8 kg, last menstrual period 07/17/2023, SpO2 99%.  Physical Exam Vitals and nursing note reviewed.  Constitutional:      Appearance: Normal appearance.  HENT:     Head: Normocephalic and atraumatic.  Eyes:     Conjunctiva/sclera: Conjunctivae normal.  Cardiovascular:     Rate and Rhythm: Normal rate.  Pulmonary:     Effort: Pulmonary effort is normal. No respiratory distress.  Musculoskeletal:        General: Normal range of motion.     Cervical back: Normal range of motion.  Neurological:     Mental Status: She is alert and oriented to [place], place, and time.  Psychiatric:        Mood and Affect: Mood normal.        Behavior: Behavior normal.     MAU Course  Procedures Results for orders placed or performed during the hospital encounter of 09/14/23 (from the past 24 hours)  Urinalysis, Routine w reflex microscopic -Urine, Clean Catch     Status: Abnormal   Collection Time: 09/14/23 11:40 PM  Result Value Ref Range   Color, Urine YELLOW YELLOW   APPearance HAZY (A) CLEAR   Specific Gravity, Urine 1.025 1.005 - 1.030   pH 5.0 5.0 - 8.0   Glucose, UA NEGATIVE NEGATIVE mg/dL   Hgb urine dipstick SMALL (A) NEGATIVE   Bilirubin Urine NEGATIVE NEGATIVE   Ketones, ur NEGATIVE NEGATIVE mg/dL   Protein, ur 161 (A) NEGATIVE mg/dL   Nitrite NEGATIVE NEGATIVE   Leukocytes,Ua NEGATIVE NEGATIVE   RBC / HPF 0-5 0 - 5 RBC/hpf   WBC, UA 0-5 0 - 5 WBC/hpf   Bacteria, UA NONE SEEN NONE SEEN   Squamous Epithelial / HPF 0-5 0 - 5 /HPF   Mucus PRESENT   hCG, quantitative, pregnancy     Status: Abnormal   Collection Time: 09/15/23 12:56 AM  Result Value Ref Range   hCG, Beta Chain, Quant, S 13,417 (H) <5 mIU/mL   US  OB Transvaginal Result Date: 09/15/2023 CLINICAL DATA:  First trimester vaginal bleeding EXAM: TRANSVAGINAL OB ULTRASOUND TECHNIQUE: Transvaginal ultrasound was performed for  complete evaluation of the gestation as well as the maternal uterus, adnexal regions, and pelvic cul-de-sac.  COMPARISON:  None Available. FINDINGS: Intrauterine gestational sac: Single Yolk sac:  Visualized. Embryo:  Visualized. Cardiac Activity: Visualized. Heart Rate: 104 bpm CRL:   3 mm   6 w 0 d                  US  EDC: 05/10/2024 Subchorionic hemorrhage: A small subchorionic hemorrhage is identified Maternal uterus/adnexae: The uterus is anteverted. The cervix is unremarkable. No intrauterine masses are visualized. Trace fluid seen within the cul-de-sac. The maternal ovaries are not visualized. IMPRESSION: Single living intrauterine gestation with an estimated gestational age of [redacted] weeks, 0 days. Small subchorionic hemorrhage Electronically Signed   By: Worthy Heads M.D.   On: 09/15/2023 02:04    MDM Physical Exam Labs: UA, hCG Ultrasound Muscle Relaxant Analgesic Assessment and Plan  33 year old, [id number]  SIUP at 6.0 weeks Back Pain SCH  -US  and hCG ordered while patient in triage. -Results as above.  -Provider to bedside to discuss. -Patient educated on [facility], what to expect including bleeding, risks for miscarriage, and resolution.   -Informed of change in EDD to May 11, 2023.  -Reviewed mild fetal bradycardia noted. Instructed to keep next US  as scheduled. -Encouraged to start prenatal care otherwise.  -Patient offered and accepts pain medication. -Will give flexeril  and tylenol . Suggested and patient accepts heating pad for back as well.  -Precautions reviewed. -Encouraged to return to MAU if symptoms worsen or with the onset of new symptoms. -Discharged to home in stable condition.  Kraig Peru MSN, CNM Advanced Practice Provider, [facility] 09/15/2023, 2:27 AM

## 2023-09-15 NOTE — MAU Note (Signed)
 Pt does not want discharge papers - OK to leave per provider. Verbal discharge instructions reviewed and pt verbalized understanding.

## 2023-09-29 ENCOUNTER — Other Ambulatory Visit

## 2023-09-30 ENCOUNTER — Ambulatory Visit

## 2023-09-30 ENCOUNTER — Other Ambulatory Visit: Payer: Self-pay

## 2023-09-30 DIAGNOSIS — Z3491 Encounter for supervision of normal pregnancy, unspecified, first trimester: Secondary | ICD-10-CM

## 2023-09-30 DIAGNOSIS — Z3A08 8 weeks gestation of pregnancy: Secondary | ICD-10-CM

## 2023-09-30 DIAGNOSIS — O3680X0 Pregnancy with inconclusive fetal viability, not applicable or unspecified: Secondary | ICD-10-CM

## 2023-10-01 ENCOUNTER — Encounter (HOSPITAL_COMMUNITY): Payer: Self-pay | Admitting: Obstetrics and Gynecology

## 2023-10-01 ENCOUNTER — Inpatient Hospital Stay (HOSPITAL_COMMUNITY)
Admission: AD | Admit: 2023-10-01 | Discharge: 2023-10-01 | Disposition: A | Discharge status: Home / Self Care | Attending: Obstetrics and Gynecology | Admitting: Obstetrics and Gynecology

## 2023-10-01 DIAGNOSIS — O26891 Other specified pregnancy related conditions, first trimester: Secondary | ICD-10-CM

## 2023-10-01 DIAGNOSIS — N898 Other specified noninflammatory disorders of vagina: Secondary | ICD-10-CM | POA: Diagnosis present

## 2023-10-01 DIAGNOSIS — Z3A08 8 weeks gestation of pregnancy: Secondary | ICD-10-CM | POA: Diagnosis not present

## 2023-10-01 DIAGNOSIS — O99891 Other specified diseases and conditions complicating pregnancy: Secondary | ICD-10-CM | POA: Diagnosis not present

## 2023-10-01 DIAGNOSIS — N9089 Other specified noninflammatory disorders of vulva and perineum: Secondary | ICD-10-CM | POA: Insufficient documentation

## 2023-10-01 DIAGNOSIS — R102 Pelvic and perineal pain: Secondary | ICD-10-CM

## 2023-10-01 LAB — WET PREP, GENITAL
Clue Cells Wet Prep HPF POC: NONE SEEN
Sperm: NONE SEEN
Trich, Wet Prep: NONE SEEN
WBC, Wet Prep HPF POC: <10 (ref <10)
Yeast Wet Prep HPF POC: NONE SEEN

## 2023-10-01 LAB — URINALYSIS, ROUTINE W REFLEX MICROSCOPIC
Bilirubin Urine: NEGATIVE
Glucose, UA: NEGATIVE mg/dL
Ketones, ur: NEGATIVE mg/dL
Leukocytes,Ua: NEGATIVE
Nitrite: NEGATIVE
Protein, ur: NEGATIVE mg/dL
Specific Gravity, Urine: 1.02 (ref 1.005–1.030)
pH: 6 (ref 5.0–8.0)

## 2023-10-01 LAB — URINALYSIS, MICROSCOPIC (REFLEX)
Bacteria, UA: NONE SEEN
RBC / HPF: >50 RBC/hpf (ref 0–5)

## 2023-10-01 MED ORDER — LIDOCAINE HCL URETHRAL/MUCOSAL 2 % EX GEL
1.0000 | Freq: Once | CUTANEOUS | Status: AC
  Administered 2023-10-01: 1 via TOPICAL
  Filled 2023-10-01: qty 6

## 2023-10-01 NOTE — MAU Provider Note (Signed)
 History     CSN: [id number]  Arrival date and time: 10/01/23 1811   Event Date/Time   First Provider Initiated Contact with Patient 10/01/23 1904      Chief Complaint  Patient presents with   vaginal irritation   Vaginal Bleeding   Abdominal Pain   HPI [name] is a 33 y.o. year old G35P2022 female at [redacted]w[redacted]d weeks gestation who presents to MAU reporting known [facility] from U/S yesterday (09/30/2023). She reports she had oral sex two days ago and "back shots" sex (ie. sex from the back) yesterday. She also reports that she "washes rough" using a "cheap" wash cloth from [employer]. She states, "I'm a big girl, so I need to make sure it's clean down there." She reports a sharp vaginal pain after urination "like there is a tear down there." She reports that pain as 5/10. She also reports urinary urgency 3 times in <1 hour timeframe prior to arrival in MAU. She receives [facility] with [facility]; next appt is 10/27/2023.  OB History     Gravida  4   Para  2   Term  2   Preterm  0   AB  2   Living  2      SAB  1   IAB  1   Ectopic  0   Multiple  1   Live Births  1           Past Medical History:  Diagnosis Date   Abnormal Pap smear    Acid reflux    ADHD (attention deficit hyperactivity disorder)    Asthma    childhood /grew out of it   Chlamydia    Degenerative disk disease    Depression    post partem   Fractured fibula    left   History of PCOS    Migraine    Trichomonas    Urinary tract infection    Vaginal Pap smear, abnormal    normal since    Past Surgical History:  Procedure Laterality Date   DILATION AND CURETTAGE OF UTERUS     INDUCED ABORTION     TONSILLECTOMY AND ADENOIDECTOMY N/A 08/02/2020   Procedure: TONSILLECTOMY AND ADENOIDECTOMY;  Surgeon: Reynold Caves, MD;  Location: [facility] OR;  Service: ENT;  Laterality: N/A;   VAGINAL DELIVERY  2013   WISDOM TOOTH EXTRACTION      Family History  Problem Relation Age of Onset   Diabetes Mother    Hypertension  Mother    Heart disease Mother    Diabetes Maternal Grandfather    Heart disease Maternal Grandmother        chf   Anesthesia problems Neg Hx    Hypotension Neg Hx    Malignant hyperthermia Neg Hx    Pseudochol deficiency Neg Hx     Social History   Tobacco Use   Smoking status: Former    Current packs/day: 0.50    Average packs/day: 0.5 packs/day for 7.0 years (3.5 ttl pk-yrs)    Types: Cigarettes   Smokeless tobacco: Never  Vaping Use   Vaping status: Never Used  Substance Use Topics   Alcohol use: Yes    Alcohol/week: 1.0 standard drink of alcohol    Types: 1 Shots of liquor per week    Comment: occ but was a heavy user in 2015 due to boyfriend passed away.   Drug use: No    Types: Marijuana    Comment: last use was April 2016  Allergies:  Allergies  Allergen Reactions   Other Anaphylaxis    Grapes, strawberries, bananas, pineapple, oranges, nuts   Pineapple Anaphylaxis and Swelling   Betadine [Povidone Iodine] Itching    Betadine causes itching and skin redness and irritation   Iodine Itching   Latex Itching   Pollen Extract Itching   Sulfa  Antibiotics Itching   Tramadol  Nausea And Vomiting   Povidone-Iodine Itching and Rash   Tomato Itching and Rash    No medications prior to admission.    Review of Systems  Constitutional: Negative.   HENT: Negative.    Eyes: Negative.   Respiratory: Negative.    Cardiovascular: Negative.   Gastrointestinal: Negative.   Endocrine: Negative.   Genitourinary:  Positive for urgency and vaginal pain.  Musculoskeletal: Negative.   Skin: Negative.   Allergic/Immunologic: Negative.   Neurological: Negative.   Hematological: Negative.   Psychiatric/Behavioral: Negative.     Physical Exam   Patient Vitals for the past 24 hrs:  BP Temp Temp src Pulse Resp SpO2 Height Weight  10/01/23 1930 118/69 -- -- 80 17 -- -- --  10/01/23 1920 -- -- -- -- -- 99 % -- --  10/01/23 1910 -- -- -- -- -- 99 % -- --  10/01/23 1900  128/68 -- -- 89 -- 96 % -- --  10/01/23 1832 (!) 146/94 98.3 F (36.8 C) Oral 89 20 100 % 5\' 2"  (1.575 m) (!) 147.5 kg  *triage BP was elevated d/t cuff being too small per Dorian Gardener, RN  Physical Exam Vitals and nursing note reviewed. Exam conducted with a chaperone present.  Constitutional:      Appearance: Normal appearance. She is obese.  Cardiovascular:     Rate and Rhythm: Normal rate.  Pulmonary:     Effort: Pulmonary effort is normal.  Abdominal:     Palpations: Abdomen is soft.  Genitourinary:    Comments: Pelvic exam: External genitalia: tear observed on inner LT vulva near clitoral hood -- HSV culture obtained, WP, GC/CT done using blind swab technique by CNM. Musculoskeletal:        General: Normal range of motion.  Skin:    General: Skin is warm and dry.  Neurological:     Mental Status: She is alert and oriented to [place], place, and time.  Psychiatric:        Mood and Affect: Mood normal.        Behavior: Behavior normal.        Thought Content: Thought content normal.        Judgment: Judgment normal.    MAU Course  Procedures  MDM CCUA Wet Prep GC/CT -- Results pending  Lidocaine  Jelly applied to LT vulva  Results for orders placed or performed during the hospital encounter of 10/01/23 (from the past 24 hours)  Urinalysis, Routine w reflex microscopic -Urine, Clean Catch     Status: Abnormal   Collection Time: 10/01/23  6:38 PM  Result Value Ref Range   Color, Urine YELLOW YELLOW   APPearance CLEAR CLEAR   Specific Gravity, Urine 1.020 1.005 - 1.030   pH 6.0 5.0 - 8.0   Glucose, UA NEGATIVE NEGATIVE mg/dL   Hgb urine dipstick MODERATE (A) NEGATIVE   Bilirubin Urine NEGATIVE NEGATIVE   Ketones, ur NEGATIVE NEGATIVE mg/dL   Protein, ur NEGATIVE NEGATIVE mg/dL   Nitrite NEGATIVE NEGATIVE   Leukocytes,Ua NEGATIVE NEGATIVE  Urinalysis, Microscopic (reflex)     Status: None   Collection Time: 10/01/23  6:38 PM  Result Value Ref Range   RBC / HPF  >50 0 - 5 RBC/hpf   WBC, UA 0-5 0 - 5 WBC/hpf   Bacteria, UA NONE SEEN NONE SEEN   Squamous Epithelial / HPF 0-5 0 - 5 /HPF  Wet prep, genital     Status: None   Collection Time: 10/01/23  7:25 PM   Specimen: Vaginal  Result Value Ref Range   Yeast Wet Prep HPF POC NONE SEEN NONE SEEN   Trich, Wet Prep NONE SEEN NONE SEEN   Clue Cells Wet Prep HPF POC NONE SEEN NONE SEEN   WBC, Wet Prep HPF POC <10 <10   Sperm NONE SEEN      Assessment and Plan  1. Vulvar pain (Primary) - Advised to apply Lidocaine  jelly to affected vulva area prn pain   2. Vulvar lesion - Will be notified if HSV culture is (+) - Will wait for results before considering Valtrex  3. [redacted] weeks gestation of pregnancy  - Discharge home - Keep scheduled appt with [facility] on 10/27/2023. - Patient verbalized an understanding of the plan of care and agrees.   Aaralynn Shepheard, CNM 10/01/2023, 7:04 PM

## 2023-10-01 NOTE — MAU Note (Addendum)
.  Teresa [name] is a 33 y.o. at [redacted]w[redacted]d here in MAU reporting: known subchorionic hemorrhage. 9wk on US  yesterday per pt.  Noticed vag pain- inside after urination, like there is a tear, again when got here and went. Increased urinary urge.... 3rd time in under hour Onset of complaint: within hour Pain score: 5 Vitals:   10/01/23 1832  BP: (!) 146/94  Pulse: 89  Resp: 20  Temp: 98.3 F (36.8 C)  SpO2: 100%      Lab orders placed from triage:  urine

## 2023-10-02 LAB — GC/CHLAMYDIA PROBE AMP (~~LOC~~) NOT AT ARMC
Chlamydia: NEGATIVE
Comment: NEGATIVE
Comment: NORMAL
Neisseria Gonorrhea: NEGATIVE

## 2023-10-05 ENCOUNTER — Ambulatory Visit: Payer: Self-pay | Admitting: Obstetrics and Gynecology

## 2023-10-05 LAB — HSV CULTURE AND TYPING

## 2023-10-23 ENCOUNTER — Telehealth

## 2023-10-23 DIAGNOSIS — Z3A11 11 weeks gestation of pregnancy: Secondary | ICD-10-CM | POA: Diagnosis not present

## 2023-10-23 DIAGNOSIS — O0991 Supervision of high risk pregnancy, unspecified, first trimester: Secondary | ICD-10-CM | POA: Diagnosis not present

## 2023-10-23 DIAGNOSIS — O161 Unspecified maternal hypertension, first trimester: Secondary | ICD-10-CM

## 2023-10-23 DIAGNOSIS — O0990 Supervision of high risk pregnancy, unspecified, unspecified trimester: Secondary | ICD-10-CM | POA: Insufficient documentation

## 2023-10-23 DIAGNOSIS — O10013 Pre-existing essential hypertension complicating pregnancy, third trimester: Secondary | ICD-10-CM | POA: Insufficient documentation

## 2023-10-23 DIAGNOSIS — O169 Unspecified maternal hypertension, unspecified trimester: Secondary | ICD-10-CM | POA: Insufficient documentation

## 2023-10-23 DIAGNOSIS — I10 Essential (primary) hypertension: Secondary | ICD-10-CM | POA: Insufficient documentation

## 2023-10-23 MED ORDER — BLOOD PRESSURE KIT DEVI: 1.0000 | Freq: Once | 0 refills | Status: AC

## 2023-10-23 MED ORDER — GOJJI WEIGHT SCALE MISC: 1.0000 | Freq: Once | 0 refills | Status: AC

## 2023-10-23 NOTE — Patient Instructions (Signed)

## 2023-10-23 NOTE — Progress Notes (Addendum)
 New OB Intake  I connected with Teresa [name]  on 10/23/23 at 10:15 AM EDT by MyChart Video Visit and verified that I am speaking with the correct Teresa [name] using two identifiers. Nurse is located at [facility] and pt is located at Home.  I discussed the limitations, risks, security and privacy concerns of performing an evaluation and management service by telephone and the availability of in [facility] appointments. I also discussed with the patient that there may be a patient responsible charge related to this service. The patient expressed understanding and agreed to proceed.  I explained I am completing New OB Intake today. We discussed EDD of 05/10/2024, Alternate EDD Entry. Pt is G3P2002. I reviewed her allergies, medications and Medical/Surgical/OB history.    Patient Active Problem List   Diagnosis Date Noted   Supervision of high risk pregnancy, antepartum 10/23/2023   Hypertension affecting pregnancy 10/23/2023   Amenorrhea 07/02/2018   Chronic cystitis 07/02/2018   DUB (dysfunctional uterine bleeding) 06/12/2015   Breast mass, right 01/11/2014     Concerns addressed today  Delivery Plans Plans to deliver at [facility]. Discussed the nature of our practice with multiple providers including residents and students. Due to the size of the practice, the delivering provider may not be the same as those providing prenatal care.   Patient is interested in water  birth.  MyChart/Babyscripts MyChart access verified. I explained pt will have some visits in office and some virtually. Babyscripts instructions given and order placed. Patient verifies receipt of registration text/e-mail. Account successfully created and app downloaded. If patient is a candidate for Optimized scheduling, add to sticky note.   Blood Pressure Cuff/Weight Scale Blood pressure cuff ordered for patient to pick-up from [employer]. Explained after first prenatal appt pt will check weekly and document in Babyscripts.  Patient does not have weight scale; order sent to [facility], patient may track weight weekly in Babyscripts.  Anatomy US  Explained first scheduled US  will be around 19 weeks. Anatomy US  scheduled for 12/26/23 at 9:00am.  Is patient a CenteringPregnancy candidate?  Declined Declined due to Schedule Not a candidate due to CHTN, medication controlled  Is patient a Mom+Baby Combined Care candidate?  Not a candidate.  Is patient a candidate for Babyscripts Optimization? No, due to hx hypertension and  past GDM.   First visit review I reviewed new OB appt with patient. Explained pt will be seen by Dr. Eveline at first visit on 10/27/23 at 9*:15am. Discussed [employer] genetic screening with patient. She desires [employer] and [employer].. Routine prenatal labs, genetic screening, and Pap Smear to be done and New OB Visit.   Last Pap Diagnosis  Date Value Ref Range Status  11/21/2017   Final   NEGATIVE FOR INTRAEPITHELIAL LESIONS OR MALIGNANCY. BENIGN REACTIVE/REPARATIVE CHANGES.    Teresa LITTIE Burows, RN 10/23/2023  11:30 AM

## 2023-10-27 ENCOUNTER — Encounter: Payer: Self-pay | Admitting: Obstetrics & Gynecology

## 2023-10-27 ENCOUNTER — Ambulatory Visit (INDEPENDENT_AMBULATORY_CARE_PROVIDER_SITE_OTHER): Admitting: Obstetrics & Gynecology

## 2023-10-27 ENCOUNTER — Other Ambulatory Visit: Payer: Self-pay

## 2023-10-27 VITALS — BP 131/84 | HR 97 | Wt 319.8 lb

## 2023-10-27 DIAGNOSIS — O99211 Obesity complicating pregnancy, first trimester: Secondary | ICD-10-CM

## 2023-10-27 DIAGNOSIS — O34219 Maternal care for unspecified type scar from previous cesarean delivery: Secondary | ICD-10-CM | POA: Diagnosis not present

## 2023-10-27 DIAGNOSIS — O99210 Obesity complicating pregnancy, unspecified trimester: Secondary | ICD-10-CM

## 2023-10-27 DIAGNOSIS — Z3A12 12 weeks gestation of pregnancy: Secondary | ICD-10-CM

## 2023-10-27 DIAGNOSIS — O0991 Supervision of high risk pregnancy, unspecified, first trimester: Secondary | ICD-10-CM

## 2023-10-27 DIAGNOSIS — O161 Unspecified maternal hypertension, first trimester: Secondary | ICD-10-CM

## 2023-10-27 DIAGNOSIS — O169 Unspecified maternal hypertension, unspecified trimester: Secondary | ICD-10-CM

## 2023-10-27 DIAGNOSIS — O0990 Supervision of high risk pregnancy, unspecified, unspecified trimester: Secondary | ICD-10-CM

## 2023-10-27 MED ORDER — ASPIRIN 81 MG PO TBEC: 81.0000 mg | DELAYED_RELEASE_TABLET | Freq: Every day | ORAL | 2 refills | Status: DC

## 2023-10-27 NOTE — Progress Notes (Unsigned)
 Subjective:    Teresa [name] is a G3P2002 [redacted]w[redacted]d being seen today for her first obstetrical visit.  Her obstetrical history is significant for obesity and CHTN, previous cesarean section. Patient does intend to breast feed. Pregnancy history fully reviewed.  Patient reports no complaints.  Vitals:   10/27/23 0938  BP: 131/84  Pulse: 97  Weight: (!) 319 lb 12.8 oz (145.1 kg)    HISTORY: OB History  Gravida Para Term Preterm AB Living  3 2 2  0 0 2  SAB IAB Ectopic Multiple Live Births  0 0 0 1 2    # Outcome Date GA Lbr Len/2nd Weight Sex Type Anes PTL Lv  3A Term 03/05/23 [redacted]w[redacted]d  6 lb 11 oz (3.033 kg) F CS-Unspec Spinal  LIV     Complications: Failure to progress in labor  3B Current           2 Term 11/27/11 [redacted]w[redacted]d 702:35 / 01:55 8 lb 5.7 oz (3.79 kg) F Vag-Spont EPI  LIV  1 Gravida            Past Medical History:  Diagnosis Date   Abnormal Pap smear    Acid reflux    ADHD (attention deficit hyperactivity disorder)    Asthma    childhood /grew out of it   Chlamydia    Degenerative disk disease    Depression    post partem   Dysmenorrhea 04/15/2012   Fractured fibula    left   History of PCOS    Migraine    Trichomonas    Urinary tract infection    Vaginal Pap smear, abnormal    normal since   Past Surgical History:  Procedure Laterality Date   CESAREAN SECTION     DILATION AND CURETTAGE OF UTERUS     INDUCED ABORTION     TONSILLECTOMY AND ADENOIDECTOMY N/A 08/02/2020   Procedure: TONSILLECTOMY AND ADENOIDECTOMY;  Surgeon: Karis Clunes, MD;  Location: [facility];  Service: ENT;  Laterality: N/A;   VAGINAL DELIVERY  2013   WISDOM TOOTH EXTRACTION     Family History  Problem Relation Age of Onset   Diabetes Mother    Hypertension Mother    Heart disease Mother    Diabetes Maternal Grandfather    Heart disease Maternal Grandmother        chf   Anesthesia problems Neg Hx    Hypotension Neg Hx    Malignant hyperthermia Neg Hx    Pseudochol deficiency Neg Hx       Exam    Uterus:   12 wee, bedside US  12 week 150 BPM  Pelvic Exam: deferred   Perineum:    Vulva:    Vagina:     pH:    Cervix:    Adnexa:    Bony Pelvis:   System: Breast:  Inspection negative   Skin: normal coloration and turgor, no rashes    Neurologic: oriented   Extremities: normal strength, tone, and muscle mass   HEENT PERRLA   Mouth/Teeth mucous membranes moist, pharynx normal without lesions   Neck supple   Cardiovascular: regular rate and rhythm   Respiratory:  appears well, vitals normal, no respiratory distress, acyanotic, normal RR   Abdomen: soft, non-tender; bowel sounds normal; no masses,  no organomegaly   Urinary:       Assessment:    Pregnancy: [id number] Patient Active Problem List   Diagnosis Date Noted   Supervision of high risk pregnancy, antepartum 10/23/2023   Hypertension  affecting pregnancy 10/23/2023   Amenorrhea 07/02/2018   Chronic cystitis 07/02/2018   DUB (dysfunctional uterine bleeding) 06/12/2015   Breast mass, right 01/11/2014        Plan:     Initial labs drawn. Prenatal vitamins. Problem list reviewed and updated. Genetic Screening discussed : ordered.  Ultrasound discussed; fetal survey: ordered.  Follow up in 4 weeks. 50% of 30 min visit spent on counseling and coordination of care.  ASA 81 mg / day, Procardia  VBAC candidate   Lynwood Solomons 10/27/2023

## 2023-10-28 LAB — COMPREHENSIVE METABOLIC PANEL WITH GFR
ALT: 13 IU/L (ref 0–32)
AST: 17 IU/L (ref 0–40)
Albumin: 3.9 g/dL (ref 3.9–4.9)
Alkaline Phosphatase: 76 IU/L (ref 44–121)
BUN/Creatinine Ratio: 12 (ref 9–23)
BUN: 7 mg/dL (ref 6–20)
Bilirubin Total: <0.2 mg/dL (ref 0.0–1.2)
CO2: 16 mmol/L — ABNORMAL LOW (ref 20–29)
Calcium: 9.3 mg/dL (ref 8.7–10.2)
Chloride: 102 mmol/L (ref 96–106)
Creatinine, Ser: 0.59 mg/dL (ref 0.57–1.00)
Globulin, Total: 3 g/dL (ref 1.5–4.5)
Glucose: 94 mg/dL (ref 70–99)
Potassium: 4.4 mmol/L (ref 3.5–5.2)
Sodium: 134 mmol/L (ref 134–144)
Total Protein: 6.9 g/dL (ref 6.0–8.5)
eGFR: 123 mL/min/{1.73_m2} (ref >59)

## 2023-10-28 LAB — PROTEIN / CREATININE RATIO, URINE
Creatinine, Urine: 296 mg/dL
Protein, Ur: 51.1 mg/dL
Protein/Creat Ratio: 173 mg/g{creat} (ref 0–200)

## 2023-10-28 LAB — TSH: TSH: 2.23 u[IU]/mL (ref 0.450–4.500)

## 2023-10-28 LAB — HEMOGLOBIN A1C
Est. average glucose Bld gHb Est-mCnc: 123 mg/dL
Hgb A1c MFr Bld: 5.9 % — ABNORMAL HIGH (ref 4.8–5.6)

## 2023-10-29 LAB — CULTURE, OB URINE

## 2023-10-29 LAB — URINE CULTURE, OB REFLEX

## 2023-11-16 ENCOUNTER — Encounter: Payer: Self-pay | Admitting: Obstetrics & Gynecology

## 2023-11-16 ENCOUNTER — Inpatient Hospital Stay (HOSPITAL_COMMUNITY)
Admission: AD | Admit: 2023-11-16 | Discharge: 2023-11-16 | Disposition: A | Discharge status: Home / Self Care | Attending: Obstetrics & Gynecology | Admitting: Obstetrics & Gynecology

## 2023-11-16 DIAGNOSIS — O0992 Supervision of high risk pregnancy, unspecified, second trimester: Secondary | ICD-10-CM | POA: Diagnosis not present

## 2023-11-16 DIAGNOSIS — U071 COVID-19: Secondary | ICD-10-CM | POA: Insufficient documentation

## 2023-11-16 DIAGNOSIS — R6889 Other general symptoms and signs: Secondary | ICD-10-CM | POA: Diagnosis not present

## 2023-11-16 DIAGNOSIS — O10912 Unspecified pre-existing hypertension complicating pregnancy, second trimester: Secondary | ICD-10-CM | POA: Insufficient documentation

## 2023-11-16 DIAGNOSIS — Z3A14 14 weeks gestation of pregnancy: Secondary | ICD-10-CM | POA: Insufficient documentation

## 2023-11-16 DIAGNOSIS — O169 Unspecified maternal hypertension, unspecified trimester: Secondary | ICD-10-CM

## 2023-11-16 DIAGNOSIS — O0990 Supervision of high risk pregnancy, unspecified, unspecified trimester: Secondary | ICD-10-CM

## 2023-11-16 DIAGNOSIS — O26892 Other specified pregnancy related conditions, second trimester: Secondary | ICD-10-CM | POA: Diagnosis present

## 2023-11-16 DIAGNOSIS — O98512 Other viral diseases complicating pregnancy, second trimester: Secondary | ICD-10-CM | POA: Diagnosis not present

## 2023-11-16 DIAGNOSIS — R519 Headache, unspecified: Secondary | ICD-10-CM

## 2023-11-16 LAB — GROUP A STREP BY PCR: Group A Strep by PCR: NOT DETECTED

## 2023-11-16 LAB — URINALYSIS, ROUTINE W REFLEX MICROSCOPIC
Bilirubin Urine: NEGATIVE
Glucose, UA: NEGATIVE mg/dL
Hgb urine dipstick: NEGATIVE
Ketones, ur: NEGATIVE mg/dL
Leukocytes,Ua: NEGATIVE
Nitrite: NEGATIVE
Protein, ur: NEGATIVE mg/dL
Specific Gravity, Urine: 1.019 (ref 1.005–1.030)
pH: 5 (ref 5.0–8.0)

## 2023-11-16 LAB — RESP PANEL BY RT-PCR (RSV, FLU A&B, COVID)  RVPGX2
Influenza A by PCR: NEGATIVE
Influenza B by PCR: NEGATIVE
Resp Syncytial Virus by PCR: NEGATIVE
SARS Coronavirus 2 by RT PCR: POSITIVE — AB

## 2023-11-16 MED ORDER — DM-GUAIFENESIN ER 30-600 MG PO TB12
1.0000 | ORAL_TABLET | Freq: Two times a day (BID) | ORAL | Status: DC
  Administered 2023-11-16: 1 via ORAL
  Filled 2023-11-16: qty 1

## 2023-11-16 MED ORDER — ACETAMINOPHEN-CAFFEINE 500-65 MG PO TABS
2.0000 | ORAL_TABLET | Freq: Once | ORAL | Status: AC
  Administered 2023-11-16: 2 via ORAL
  Filled 2023-11-16: qty 2

## 2023-11-16 NOTE — MAU Note (Signed)
 Rashell C Chubbuck is a 33 y.o. at [redacted]w[redacted]d here in MAU reporting: started feeling bad this morning. Both ears hurt. Throat pain headache and body ache. Nasal congestion sneezing  nad cough. No fever 99. Pt stated her daughter is sick as well.  Has taken tylenol  cold and flu and has not helped her symptoms. Also c/o diarrhea that started when she got here.  Stated she had not felt baby move today. Claims she feels her move all the time.   LMP:  Onset of complaint: this morning Pain score: 10 Vitals:   11/16/23 2143  BP: 137/73  Pulse: (!) 118  Resp: 18  Temp: 98.5 F (36.9 C)     FHT: unable to get with doppler due to pt panis   Lab orders placed from triage: Strep A , Resp Panel , U/A

## 2023-11-16 NOTE — MAU Provider Note (Signed)
 History     CSN: [id number]  Arrival date and time: 11/16/23 2049   Event Date/Time   First Provider Initiated Contact with Patient 11/16/23 2211      Chief Complaint  Patient presents with   Otalgia   Headache   throat pain   Teresa [name] , a  33 y.o. G3P2002 at [redacted]w[redacted]d presents to MAU with complaints of cold and flu like symptoms. She states that recently her 41-month old came home with similar symptoms ans she believes she got it from her. She reports an on-going headache with a cough and runny nose. She also reports nasal congestion and sore throat. She reports attempting several doses of tylenol  today without much relief, last dosing >4 hours ago.  She reports lack of appetite and reports only eating sandwich this morning for breakfast. She denies pushing fluids. Also denies vaginal bleeding leaking of fluid and abdominal pain. Reports all over body aches. Denies a fever.          OB History     Gravida  3   Para  2   Term  2   Preterm  0   AB  0   Living  2      SAB  0   IAB  0   Ectopic  0   Multiple  1   Live Births  2           Past Medical History:  Diagnosis Date   Abnormal Pap smear    Acid reflux    ADHD (attention deficit hyperactivity disorder)    Asthma    childhood /grew out of it   Chlamydia    Degenerative disk disease    Depression    post partem   Dysmenorrhea 04/15/2012   Fractured fibula    left   History of PCOS    Migraine    Trichomonas    Urinary tract infection    Vaginal Pap smear, abnormal    normal since    Past Surgical History:  Procedure Laterality Date   CESAREAN SECTION     DILATION AND CURETTAGE OF UTERUS     INDUCED ABORTION     TONSILLECTOMY AND ADENOIDECTOMY N/A 08/02/2020   Procedure: TONSILLECTOMY AND ADENOIDECTOMY;  Surgeon: Karis Clunes, MD;  Location: [facility] OR;  Service: ENT;  Laterality: N/A;   VAGINAL DELIVERY  2013   WISDOM TOOTH EXTRACTION      Family History  Problem Relation Age of  Onset   Diabetes Mother    Hypertension Mother    Heart disease Mother    Diabetes Maternal Grandfather    Heart disease Maternal Grandmother        chf   Anesthesia problems Neg Hx    Hypotension Neg Hx    Malignant hyperthermia Neg Hx    Pseudochol deficiency Neg Hx     Social History   Tobacco Use   Smoking status: Former    Current packs/day: 0.50    Average packs/day: 0.5 packs/day for 7.0 years (3.5 ttl pk-yrs)    Types: Cigarettes   Smokeless tobacco: Never  Vaping Use   Vaping status: Never Used  Substance Use Topics   Alcohol use: Not Currently    Alcohol/week: 1.0 standard drink of alcohol    Types: 1 Shots of liquor per week    Comment: occ but was a heavy user in 2015 due to boyfriend passed away.   Drug use: Not Currently    Types: Marijuana  Comment: last 10/15/23    Allergies:  Allergies  Allergen Reactions   Other Anaphylaxis    Grapes, strawberries, bananas, pineapple, oranges, nuts   Pineapple Anaphylaxis and Swelling   Betadine [Povidone Iodine] Itching    Betadine causes itching and skin redness and irritation   Iodine Itching   Latex Itching   Pollen Extract Itching   Sulfa  Antibiotics Itching   Tramadol  Nausea And Vomiting   Povidone-Iodine Itching and Rash   Tomato Itching and Rash    Medications Prior to Admission  Medication Sig Dispense Refill Last Dose/Taking   aspirin  EC 81 MG tablet Take 1 tablet (81 mg total) by mouth at bedtime. Start taking when you are [redacted] weeks pregnant for rest of pregnancy for prevention of preeclampsia 300 tablet 2    NIFEdipine  (PROCARDIA -XL/NIFEDICAL-XL) 30 MG 24 hr tablet Take 1 tablet (30 mg total) by mouth daily. 30 tablet 1    ondansetron  (ZOFRAN ) 4 MG tablet Take 2 tablets (8 mg total) by mouth 2 (two) times daily. (Patient not taking: Reported on 10/27/2023) 20 tablet 0    Prenatal Vit-Fe Fumarate-FA (PRENATAL COMPLETE) 14-0.4 MG TABS Take 1 tablet by mouth daily. 60 tablet 3     Review of Systems   Constitutional:  Positive for chills and fatigue. Negative for fever.  HENT:  Positive for congestion, ear pain, postnasal drip, rhinorrhea, sinus pressure, sinus pain, sneezing and sore throat.   Eyes:  Positive for pain. Negative for visual disturbance.  Respiratory:  Positive for cough. Negative for apnea, shortness of breath and wheezing.   Cardiovascular:  Negative for chest pain and palpitations.  Gastrointestinal:  Negative for abdominal pain, constipation, diarrhea, nausea and vomiting.  Genitourinary:  Negative for difficulty urinating, dysuria, pelvic pain, vaginal bleeding, vaginal discharge and vaginal pain.  Musculoskeletal:  Positive for arthralgias. Negative for back pain.  Neurological:  Positive for headaches. Negative for seizures and weakness.  Psychiatric/Behavioral:  Negative for suicidal ideas.    Physical Exam   Blood pressure 137/73, pulse (!) 118, temperature 98.5 F (36.9 C), resp. rate 18, height 5' 2 (1.575 m), weight (!) 150.1 kg, last menstrual period 07/17/2023, unknown if currently breastfeeding.  Physical Exam Vitals and nursing note reviewed.  Constitutional:      General: She is not in acute distress.    Appearance: Normal appearance. She is well-developed. She is ill-appearing.  HENT:     Head: Normocephalic.  Pulmonary:     Effort: Pulmonary effort is normal.  Abdominal:     Palpations: Abdomen is soft.     Tenderness: There is no abdominal tenderness.  Musculoskeletal:     Cervical back: Normal range of motion.  Skin:    General: Skin is warm and dry.  Neurological:     Mental Status: She is alert and oriented to [place], place, and time.     GCS: GCS eye subscore is 4. GCS verbal subscore is 5. GCS motor subscore is 6.  Psychiatric:        Mood and Affect: Mood normal.      FHR: 150 bpm Patient informed that the ultrasound is considered a limited OB ultrasound and is not intended to be a complete ultrasound exam.  Patient also informed  that the ultrasound is not being completed with the intent of assessing for fetal or placental anomalies or any pelvic abnormalities.  Explained that the purpose of today's ultrasound is to assess for viability.   Patient acknowledges the purpose of the exam and the  limitations of the study.  MAU Course  Procedures Orders Placed This Encounter  Procedures   Group A Strep by PCR   Resp panel by RT-PCR (RSV, Flu A&B, Covid) Anterior Nasal Swab   Urinalysis, Routine w reflex microscopic -Urine, Clean Catch   Meds ordered this encounter  Medications   acetaminophen -caffeine  (EXCEDRIN TENSION HEADACHE) 500-65 MG per tablet 2 tablet   dextromethorphan -guaiFENesin  (MUCINEX  DM) 30-600 MG per 12 hr tablet 1 tablet   Results for orders placed or performed during the hospital encounter of 11/16/23 (from the past 24 hours)  Urinalysis, Routine w reflex microscopic -Urine, Clean Catch     Status: None   Collection Time: 11/16/23  9:53 PM  Result Value Ref Range   Color, Urine YELLOW YELLOW   APPearance CLEAR CLEAR   Specific Gravity, Urine 1.019 1.005 - 1.030   pH 5.0 5.0 - 8.0   Glucose, UA NEGATIVE NEGATIVE mg/dL   Hgb urine dipstick NEGATIVE NEGATIVE   Bilirubin Urine NEGATIVE NEGATIVE   Ketones, ur NEGATIVE NEGATIVE mg/dL   Protein, ur NEGATIVE NEGATIVE mg/dL   Nitrite NEGATIVE NEGATIVE   Leukocytes,Ua NEGATIVE NEGATIVE    MDM - VS stable in MAU  - Rx for Excedrin and mucinex  given given in MAU - Discussed that Resp panel can take a while to come back. Through shared decision making plan to discharge home and notify patient via MyChart with results. CNM agreeable to plan of care.  - plan for discharge   Assessment and Plan  ***  Teresa [name] 11/16/2023, 10:11 PM

## 2023-11-17 ENCOUNTER — Ambulatory Visit: Payer: Self-pay | Admitting: Certified Nurse Midwife

## 2023-11-17 ENCOUNTER — Telehealth: Payer: Self-pay | Admitting: Family Medicine

## 2023-11-17 DIAGNOSIS — U071 COVID-19: Secondary | ICD-10-CM

## 2023-11-17 NOTE — Telephone Encounter (Signed)
 Pt called MAU requesting for the anti-COVID medication that was told would be sent in for her. I confirmed using pt name and DOB the identity of patient before any HIPAA information exchanged.  I confirmed nrl Cr and GFR on 6/30 and that symptoms <24hrs and offered Paxlovid . Pt asked about potential side effects and after counseling she decides to not use Paxlovid .  I sent her OTC approved list for symptomatology management.   Augustin Slade, MD Family Medicine - Obstetrics Fellow

## 2023-11-19 MED ORDER — PAXLOVID (150/100) 10 X 150 MG & 10 X 100MG PO TBPK
2.0000 | ORAL_TABLET | Freq: Two times a day (BID) | ORAL | 0 refills | Status: AC
Start: 2023-11-19 — End: 2023-11-24

## 2023-11-19 NOTE — Addendum Note (Signed)
 Addended by: Shanan Mcmiller M on: 11/19/2023 01:43 PM   Modules accepted: Orders

## 2023-11-24 ENCOUNTER — Other Ambulatory Visit: Payer: Self-pay

## 2023-11-24 ENCOUNTER — Other Ambulatory Visit

## 2023-11-24 DIAGNOSIS — O0990 Supervision of high risk pregnancy, unspecified, unspecified trimester: Secondary | ICD-10-CM

## 2023-11-24 NOTE — Addendum Note (Signed)
 Addended by: DAUNE [place] HERO on: 11/24/2023 09:38 AM   Modules accepted: Orders

## 2023-11-26 ENCOUNTER — Other Ambulatory Visit: Payer: Self-pay

## 2023-11-26 ENCOUNTER — Inpatient Hospital Stay (HOSPITAL_COMMUNITY)
Admission: AD | Admit: 2023-11-26 | Discharge: 2023-11-26 | Disposition: A | Discharge status: Home / Self Care | Payer: Self-pay | Attending: Obstetrics and Gynecology | Admitting: Obstetrics and Gynecology

## 2023-11-26 DIAGNOSIS — Z3A16 16 weeks gestation of pregnancy: Secondary | ICD-10-CM

## 2023-11-26 DIAGNOSIS — R1030 Lower abdominal pain, unspecified: Secondary | ICD-10-CM | POA: Insufficient documentation

## 2023-11-26 DIAGNOSIS — O26893 Other specified pregnancy related conditions, third trimester: Secondary | ICD-10-CM | POA: Diagnosis present

## 2023-11-26 DIAGNOSIS — O10913 Unspecified pre-existing hypertension complicating pregnancy, third trimester: Secondary | ICD-10-CM | POA: Insufficient documentation

## 2023-11-26 DIAGNOSIS — O26892 Other specified pregnancy related conditions, second trimester: Secondary | ICD-10-CM

## 2023-11-26 DIAGNOSIS — O26899 Other specified pregnancy related conditions, unspecified trimester: Secondary | ICD-10-CM

## 2023-11-26 DIAGNOSIS — R109 Unspecified abdominal pain: Secondary | ICD-10-CM

## 2023-11-26 LAB — CBC
HCT: 34.8 % — ABNORMAL LOW (ref 36.0–46.0)
Hemoglobin: 11.8 g/dL — ABNORMAL LOW (ref 12.0–15.0)
MCH: 29.8 pg (ref 26.0–34.0)
MCHC: 33.9 g/dL (ref 30.0–36.0)
MCV: 87.9 fL (ref 80.0–100.0)
Platelets: 312 K/uL (ref 150–400)
RBC: 3.96 MIL/uL (ref 3.87–5.11)
RDW: 13.1 % (ref 11.5–15.5)
WBC: 9.4 K/uL (ref 4.0–10.5)
nRBC: 0 % (ref 0.0–0.2)

## 2023-11-26 LAB — COMPREHENSIVE METABOLIC PANEL WITH GFR
ALT: 13 U/L (ref 0–44)
AST: 21 U/L (ref 15–41)
Albumin: 2.9 g/dL — ABNORMAL LOW (ref 3.5–5.0)
Alkaline Phosphatase: 47 U/L (ref 38–126)
Anion gap: 8 (ref 5–15)
BUN: 6 mg/dL (ref 6–20)
CO2: 20 mmol/L — ABNORMAL LOW (ref 22–32)
Calcium: 8.5 mg/dL — ABNORMAL LOW (ref 8.9–10.3)
Chloride: 105 mmol/L (ref 98–111)
Creatinine, Ser: 0.45 mg/dL (ref 0.44–1.00)
GFR, Estimated: >60 mL/min (ref >60)
Glucose, Bld: 132 mg/dL — ABNORMAL HIGH (ref 70–99)
Potassium: 3.9 mmol/L (ref 3.5–5.1)
Sodium: 133 mmol/L — ABNORMAL LOW (ref 135–145)
Total Bilirubin: <0.2 mg/dL (ref 0.0–1.2)
Total Protein: 6.6 g/dL (ref 6.5–8.1)

## 2023-11-26 LAB — URINALYSIS, ROUTINE W REFLEX MICROSCOPIC
Bilirubin Urine: NEGATIVE
Glucose, UA: NEGATIVE mg/dL
Hgb urine dipstick: NEGATIVE
Ketones, ur: NEGATIVE mg/dL
Nitrite: NEGATIVE
Protein, ur: NEGATIVE mg/dL
Specific Gravity, Urine: 1.018 (ref 1.005–1.030)
pH: 7 (ref 5.0–8.0)

## 2023-11-26 LAB — WET PREP, GENITAL
Clue Cells Wet Prep HPF POC: NONE SEEN
Sperm: NONE SEEN
Trich, Wet Prep: NONE SEEN
WBC, Wet Prep HPF POC: >=10 — AB (ref <10)
Yeast Wet Prep HPF POC: NONE SEEN

## 2023-11-26 MED ORDER — CYCLOBENZAPRINE HCL 10 MG PO TABS
10.0000 mg | ORAL_TABLET | Freq: Two times a day (BID) | ORAL | 0 refills | Status: DC | PRN
Start: 2023-11-26 — End: 2023-11-28

## 2023-11-26 MED ORDER — IBUPROFEN 600 MG PO TABS
600.0000 mg | ORAL_TABLET | Freq: Once | ORAL | Status: AC
  Administered 2023-11-26: 600 mg via ORAL
  Filled 2023-11-26: qty 1

## 2023-11-26 MED ORDER — CYCLOBENZAPRINE HCL 10 MG PO TABS: 10.0000 mg | ORAL_TABLET | Freq: Two times a day (BID) | ORAL | 0 refills | Status: DC | PRN

## 2023-11-26 NOTE — MAU Provider Note (Signed)
 Chief Complaint:  Abdominal Pain and Back Pain   HPI   None     Teresa [name] is a 33 y.o. G3P2002 at [redacted]w[redacted]d who presents to maternity admissions reporting abdominal pain and low back pain. The abdominal pain is located in the lower abdomen. The pain is described as cramping, and is 8/10 in intensity. Onset was a few hours ago. Symptoms have been unchanged since. The low back pain is constant, she is unable to describe the pain. At home, she has tried two Tylenol  at 0600, unsure of exact dosing. The patient denies chills, constipation, diarrhea, dysuria, fever, hematochezia, hematuria, nausea, and vomiting. She does not have pruritus, does not have rash.  Pregnancy Course: Receives care at [facility]. Prenatal records reviewed. Pregnancy complicated by cHTN, BMI >30.  Past Medical History:  Diagnosis Date   Abnormal Pap smear    Acid reflux    ADHD (attention deficit hyperactivity disorder)    Asthma    childhood /grew out of it   Chlamydia    Degenerative disk disease    Depression    post partem   Dysmenorrhea 04/15/2012   Fractured fibula    left   History of PCOS    Migraine    Trichomonas    Urinary tract infection    Vaginal Pap smear, abnormal    normal since   OB History  Gravida Para Term Preterm AB Living  3 2 2  0 0 2  SAB IAB Ectopic Multiple Live Births  0 0 0 1 2    # Outcome Date GA Lbr Len/2nd Weight Sex Type Anes PTL Lv  3A Term 03/05/23 [redacted]w[redacted]d  3033 g F CS-Unspec Spinal  LIV     Complications: Failure to progress in labor  3B Current           2 Term 11/27/11 [redacted]w[redacted]d 702:35 / 01:55 3790 g F Vag-Spont EPI  LIV  1 Gravida            Past Surgical History:  Procedure Laterality Date   CESAREAN SECTION     DILATION AND CURETTAGE OF UTERUS     INDUCED ABORTION     TONSILLECTOMY AND ADENOIDECTOMY N/A 08/02/2020   Procedure: TONSILLECTOMY AND ADENOIDECTOMY;  Surgeon: Karis Clunes, MD;  Location: [facility] OR;  Service: ENT;  Laterality: N/A;   VAGINAL DELIVERY  2013    WISDOM TOOTH EXTRACTION     Family History  Problem Relation Age of Onset   Diabetes Mother    Hypertension Mother    Heart disease Mother    Diabetes Maternal Grandfather    Heart disease Maternal Grandmother        chf   Anesthesia problems Neg Hx    Hypotension Neg Hx    Malignant hyperthermia Neg Hx    Pseudochol deficiency Neg Hx    Social History   Tobacco Use   Smoking status: Former    Current packs/day: 0.50    Average packs/day: 0.5 packs/day for 7.0 years (3.5 ttl pk-yrs)    Types: Cigarettes   Smokeless tobacco: Never  Vaping Use   Vaping status: Never Used  Substance Use Topics   Alcohol use: Not Currently    Alcohol/week: 1.0 standard drink of alcohol    Types: 1 Shots of liquor per week    Comment: occ but was a heavy user in 2015 due to boyfriend passed away.   Drug use: Not Currently    Types: Marijuana    Comment: last 10/15/23  Allergies  Allergen Reactions   Other Anaphylaxis    Grapes, strawberries, bananas, pineapple, oranges, nuts   Peanut (Diagnostic) Anaphylaxis   Pineapple Anaphylaxis and Swelling   Betadine [Povidone Iodine] Itching    Betadine causes itching and skin redness and irritation   Povidone-Iodine Itching, Rash and Dermatitis    Betadine causes itching and skin redness and irritation   Pollen Extract Itching   Sulfa  Antibiotics Itching   Bee Pollen Itching   Iodine Itching   Latex Itching   Tomato Itching and Rash   Tramadol  Nausea And Vomiting and Nausea Only   Medications Prior to Admission  Medication Sig Dispense Refill Last Dose/Taking   aspirin  EC 81 MG tablet Take 1 tablet (81 mg total) by mouth at bedtime. Start taking when you are [redacted] weeks pregnant for rest of pregnancy for prevention of preeclampsia 300 tablet 2    NIFEdipine  (PROCARDIA -XL/NIFEDICAL-XL) 30 MG 24 hr tablet Take 1 tablet (30 mg total) by mouth daily. 30 tablet 1    ondansetron  (ZOFRAN ) 4 MG tablet Take 2 tablets (8 mg total) by mouth 2 (two) times  daily. (Patient not taking: Reported on 10/27/2023) 20 tablet 0    Prenatal Vit-Fe Fumarate-FA (PRENATAL COMPLETE) 14-0.4 MG TABS Take 1 tablet by mouth daily. 60 tablet 3     I have reviewed patient's Past Medical Hx, Surgical Hx, Family Hx, Social Hx, medications and allergies.   ROS  Pertinent items noted in HPI and remainder of comprehensive ROS otherwise negative.   PHYSICAL EXAM  Patient Vitals for the past 24 hrs:  BP Temp Temp src Pulse Resp SpO2 Height Weight  11/26/23 1005 131/77 98.9 F (37.2 C) Oral 96 20 100 % -- --  11/26/23 0958 -- -- -- -- -- -- 5' 2 (1.575 m) (!) 151.7 kg    Constitutional: Well-developed, well-nourished female in no acute distress.  HEENT: atraumatic, normocephalic. Neck has normal ROM. EOM intact. Cardiovascular: normal rate & rhythm, warm and well-perfused Respiratory: normal effort, no problems with respiration noted GI: Abd soft, non-tender, non-distended MSK: Extremities nontender, no edema, normal ROM. Mild paraspinal tenderness.  Skin: warm and dry. Acyanotic, no jaundice or pallor. Neurologic: Alert and oriented x 4. No abnormal coordination. Psychiatric: Normal mood. Speech not slurred, not rapid/pressured. Patient is cooperative. GU: no CVA tenderness  Labs: Results for orders placed or performed during the hospital encounter of 11/26/23 (from the past 24 hours)  Urinalysis, Routine w reflex microscopic -Urine, Clean Catch     Status: Abnormal   Collection Time: 11/26/23 10:24 AM  Result Value Ref Range   Color, Urine YELLOW YELLOW   APPearance CLEAR CLEAR   Specific Gravity, Urine 1.018 1.005 - 1.030   pH 7.0 5.0 - 8.0   Glucose, UA NEGATIVE NEGATIVE mg/dL   Hgb urine dipstick NEGATIVE NEGATIVE   Bilirubin Urine NEGATIVE NEGATIVE   Ketones, ur NEGATIVE NEGATIVE mg/dL   Protein, ur NEGATIVE NEGATIVE mg/dL   Nitrite NEGATIVE NEGATIVE   Leukocytes,Ua TRACE (A) NEGATIVE   RBC / HPF 0-5 0 - 5 RBC/hpf   WBC, UA 0-5 0 - 5 WBC/hpf    Bacteria, UA RARE (A) NONE SEEN   Squamous Epithelial / HPF 0-5 0 - 5 /HPF   Mucus PRESENT   Wet prep, genital     Status: Abnormal   Collection Time: 11/26/23 10:24 AM  Result Value Ref Range   Yeast Wet Prep HPF POC NONE SEEN NONE SEEN   Trich, Wet Prep NONE SEEN NONE  SEEN   Clue Cells Wet Prep HPF POC NONE SEEN NONE SEEN   WBC, Wet Prep HPF POC >=10 (A) <10   Sperm NONE SEEN   CBC     Status: Abnormal   Collection Time: 11/26/23 10:33 AM  Result Value Ref Range   WBC 9.4 4.0 - 10.5 K/uL   RBC 3.96 3.87 - 5.11 MIL/uL   Hemoglobin 11.8 (L) 12.0 - 15.0 g/dL   HCT 65.1 (L) 63.9 - 53.9 %   MCV 87.9 80.0 - 100.0 fL   MCH 29.8 26.0 - 34.0 pg   MCHC 33.9 30.0 - 36.0 g/dL   RDW 86.8 88.4 - 84.4 %   Platelets 312 150 - 400 K/uL   nRBC 0.0 0.0 - 0.2 %    Imaging:  No results found.  MDM & MAU COURSE  MDM: Moderate  MAU Course: -Vital signs within normal limits. Afebrile. -Patient eating [employer] and drinking while in lobby and triage, no acute distress or visible discomfort. -Wet prep and UA to rule out infection. -CBC and CMP to rule out GI causes of lower abdominal pain. -Fetal heart tones appropriate. -<[redacted] weeks gestation, ibuprofen  for pain while awaiting results. -CBC with mild anemia, no elevation in WBC. -Wet prep and UA negative for infection.  Differential diagnosis considered for lower abdominal pain includes but is not limited to: round ligament pain, UTI, pyelonephritis, PID, cervicitis, appendicitis, diverticulitis, constipation, nephrolithiasis  Differential diagnosis considered for back pain includes but is not limited to: nephrolithiasis, constipation, acute back strain   Orders Placed This Encounter  Procedures   Wet prep, genital   Urinalysis, Routine w reflex microscopic -Urine, Clean Catch   Discharge patient   Meds ordered this encounter  Medications   ibuprofen  (ADVIL ) tablet 600 mg   DISCONTD: cyclobenzaprine  (FLEXERIL ) 10 MG tablet    Sig: Take 1  tablet (10 mg total) by mouth 2 (two) times daily as needed for muscle spasms.    Dispense:  20 tablet    Refill:  0   cyclobenzaprine  (FLEXERIL ) 10 MG tablet    Sig: Take 1 tablet (10 mg total) by mouth 2 (two) times daily as needed for muscle spasms.    Dispense:  20 tablet    Refill:  0    ASSESSMENT   1. Abdominal cramping affecting pregnancy   2. Low back pain during pregnancy in second trimester   3. [redacted] weeks gestation of pregnancy     PLAN  Discharge home in stable condition with return precautions.  Recommend conservative measures for low back pain. Flexeril  sent to pharmacy. Follow up at routine prenatal appointment on 11/28/2023.   Follow-up Information     [facility] Follow up.   Specialty: Obstetrics and Gynecology Why: As scheduled for ongoing prenatal care Contact information: [address] [place]  [id number] [phone]                 Allergies as of 11/26/2023       Reactions   Other Anaphylaxis   Grapes, strawberries, bananas, pineapple, oranges, nuts   Peanut (diagnostic) Anaphylaxis   Pineapple Anaphylaxis, Swelling   Betadine [povidone Iodine] Itching   Betadine causes itching and skin redness and irritation   Povidone-iodine Itching, Rash, Dermatitis   Betadine causes itching and skin redness and irritation   Pollen Extract Itching   Sulfa  Antibiotics Itching   Bee Pollen Itching   Iodine Itching   Latex Itching  Tomato Itching, Rash   Tramadol  Nausea And Vomiting, Nausea Only        Medication List     TAKE these medications    aspirin  EC 81 MG tablet Take 1 tablet (81 mg total) by mouth at bedtime. Start taking when you are [redacted] weeks pregnant for rest of pregnancy for prevention of preeclampsia   cyclobenzaprine  10 MG tablet Commonly known as: FLEXERIL  Take 1 tablet (10 mg total) by mouth 2 (two) times daily as needed for muscle spasms.   NIFEdipine   30 MG 24 hr tablet Commonly known as: PROCARDIA -XL/NIFEDICAL-XL Take 1 tablet (30 mg total) by mouth daily.   ondansetron  4 MG tablet Commonly known as: Zofran  Take 2 tablets (8 mg total) by mouth 2 (two) times daily.   Prenatal Complete 14-0.4 MG Tabs Take 1 tablet by mouth daily.        Joesph DELENA Sear, PA

## 2023-11-26 NOTE — MAU Note (Signed)
 Teresa [name] is a 33 y.o. at [redacted]w[redacted]d here in MAU reporting: she's having lower back pain and abdominal cramping.  Report back pain is constant but unable to describe pain.  Denies VB or LOF.  Also reports has only felt one FM this morning.  Reports took two Tylenol  this morning @ 0600.  LMP: 07/17/2023 Onset of complaint: today Pain score: 8 cramping & 10 back Vitals:   11/26/23 1005  BP: 131/77  Pulse: 96  Resp: 20  Temp: 98.9 F (37.2 C)  SpO2: 100%     FHT: 144 bpm  Lab orders placed from triage: UA

## 2023-11-26 NOTE — Discharge Instructions (Addendum)
 BACK PAIN: Rest Use ice/heat/warm bath Increase PO fluids, aim for 80-100 ounces of fluid intake each day Tylenol  is safe to take for pain. Do not take more than 4000 mg total in 24 hours. Pregnancy support belt Pregnancy Yoga: There are free options through YouTube or  you can purchase DVDs.  Float therapy: This involves soaking in a warm bath of magnesium  to help relax muscles. Local places that have this option are [facility] in [place] or [facility] in [facility] Massage therapy: This is safe in pregnancy. Just assure your therapist is trained in prenatal massage. Some local options include [employer] and [facility] Chiropractic care: This involves re-aligning the bones and muscles of the body. You want to make sure the practitioner has training in pregnancy ([employer] Method).  A local option is [facility] at [employer] and [employer]. [phone] [email or web address] and there are many other chiropractic offices that do adjustments in pregnancy  BACK PAIN EXERCISES Stomach tone  Lie on your front with your arms by your side, head on one side. Pull in your stomach muscles, centered around your belly button. Hold for five seconds. Repeat three times. Build up to 10 seconds and repeat during the day, while walking or standing. Keep breathing during this exercise!  Pelvic tilt  Lie down with your knees bent. Tighten your stomach muscles, flattening your back against the floor. Hold for five seconds. Repeat five times.  Knee rolls  Lie on your back with your knees bent and your feet together. Roll your knees to one side, keeping your shoulders flat on the bed or floor, and hold for 10 seconds. Roll your knees back to the starting position, and then over to the other side and repeat. Do this exercise three times on each side.  Knees to chest  Lie on your back, with your knees bent and feet flat on the floor or  bed. Bring one knee up and use your hands to pull it gently towards your chest. Hold the leg in position for five seconds, and then relax. Repeat this exercise with the other knee. Do the exercise five times on each side.  Buttock tone  Lie on your front and bend one leg up behind you. Lift your bent knee just off the floor. Hold for up to eight seconds. Repeat five times each side.   Deep stomach muscle tone  Kneel on all fours with a small curve in your lower back. Let your stomach relax completely. Pull the lower part of your stomach upwards so that you lift your back (without arching it) away from the floor. Hold for 10 seconds. Keep breathing! Repeat 10 times.  Back stabilizer  Kneel on all fours with your back straight. Tighten your stomach. Keeping your back in this position, raise one arm in front of you and hold for 10 seconds. Try to keep your pelvis level and don't rotate your body. Repeat 10 times each side. To progress, try lifting one leg behind you instead of raising your arm.  Leg raise  Lie face down, though you might want to turn your head to one side if this is more comfortable. Tighten your stomach and buttock muscles to lift one leg slightly off the floor, while keeping your hips flat on the ground. Hold this position for 5 to 10 seconds and repeat 3 times.   Arm raise  Lie on your stomach with your back in a neutral position. Tense  the muscles in your lower stomach and raise one arm upwards. Hold this position for five seconds, and then relax your arm. Repeat this exercise 10 times with each arm.  Hamstring stretch  Steady yourself, then put one leg up on a chair. Keeping your raised leg straight, bend the supporting knee forward to stretch your hamstrings. Repeat three times each side. Please note: For those with acute sciatica this hamstring stretch may also pull on the sciatic nerve, making it feel worse. If in doubt, ask a physiotherapist if this exercise is  suitable for you.  One-leg stand  Steady yourself with one hand on a wall or work surface for support. Bend one leg up behind you. Hold your foot for 10 seconds and repeat three times each side. Try to keep your knees and thighs level with one another.  Deep lunge  Kneel on one knee, the other foot in front. Lift your back knee up, making sure you keep looking forwards. Push your hips forward. Hold for five seconds and repeat three times each side. Try to keep your upper body upright, avoid bending or leaning your upper body forwards.

## 2023-11-28 ENCOUNTER — Other Ambulatory Visit: Payer: Self-pay

## 2023-11-28 ENCOUNTER — Ambulatory Visit: Admitting: Obstetrics and Gynecology

## 2023-11-28 VITALS — BP 135/89 | HR 96 | Wt 337.6 lb

## 2023-11-28 DIAGNOSIS — O0992 Supervision of high risk pregnancy, unspecified, second trimester: Secondary | ICD-10-CM

## 2023-11-28 DIAGNOSIS — O162 Unspecified maternal hypertension, second trimester: Secondary | ICD-10-CM

## 2023-11-28 DIAGNOSIS — Z98891 History of uterine scar from previous surgery: Secondary | ICD-10-CM

## 2023-11-28 DIAGNOSIS — Z8632 Personal history of gestational diabetes: Secondary | ICD-10-CM

## 2023-11-28 DIAGNOSIS — Z3A16 16 weeks gestation of pregnancy: Secondary | ICD-10-CM | POA: Diagnosis not present

## 2023-11-28 DIAGNOSIS — O0990 Supervision of high risk pregnancy, unspecified, unspecified trimester: Secondary | ICD-10-CM

## 2023-11-28 DIAGNOSIS — O169 Unspecified maternal hypertension, unspecified trimester: Secondary | ICD-10-CM

## 2023-11-28 MED ORDER — PRENATAL COMPLETE 14-0.4 MG PO TABS: 1.0000 | ORAL_TABLET | Freq: Every day | ORAL | 6 refills | Status: AC

## 2023-11-28 MED ORDER — NIFEDIPINE ER OSMOTIC RELEASE 30 MG PO TB24: 30.0000 mg | ORAL_TABLET | Freq: Every day | ORAL | 5 refills | Status: DC

## 2023-11-28 MED ORDER — ASPIRIN 81 MG PO TBEC: 162.0000 mg | DELAYED_RELEASE_TABLET | Freq: Every day | ORAL | 2 refills | Status: DC

## 2023-11-28 NOTE — Progress Notes (Signed)
   PRENATAL VISIT NOTE  Subjective:  Teresa [name] is a 33 y.o. G3P2002 at [redacted]w[redacted]d being seen today for ongoing prenatal care.  She is currently monitored for the following issues for this high-risk pregnancy and has Breast mass, right; Chronic cystitis; Supervision of high risk pregnancy, antepartum; Hypertension affecting pregnancy; PCOS (polycystic ovarian syndrome); and History of cesarean delivery on their problem list.  Patient doing well with no acute concerns today. She reports occasional dizziness and fatigue.  Contractions: Not present. Vag. Bleeding: None.  Movement: Present. Denies leaking of fluid.   The following portions of the patient's history were reviewed and updated as appropriate: allergies, current medications, past family history, past medical history, past social history, past surgical history and problem list. Problem list updated.  Objective:   Vitals:   11/28/23 0909  BP: 135/89  Pulse: 96  Weight: (!) 337 lb 9.6 oz (153.1 kg)    Fetal Status: Fetal Heart Rate (bpm): 142   Movement: Present     General:  Alert, oriented and cooperative. Patient is in no acute distress.  Skin: Skin is warm and dry. No rash noted.   Cardiovascular: Normal heart rate noted  Respiratory: Normal respiratory effort, no problems with respiration noted  Abdomen: Soft, gravid, appropriate for gestational age.  Pain/Pressure: Absent     Pelvic: Cervical exam deferred        Extremities: Normal range of motion.  Edema: None  Mental Status:  Normal mood and affect. Normal behavior. Normal judgment and thought content.   Assessment and Plan:  Pregnancy: G3P2002 at [redacted]w[redacted]d  1. [redacted] weeks gestation of pregnancy (Primary)   2. Supervision of high risk pregnancy, antepartum Continue routine prenatal care, early 2 hour GTT today  - AFP, Serum, Open Spina Bifida - RPR - HIV Antibody (routine testing w rflx) - Glucose Tolerance, 2 Hours w/1 Hour - Prenatal Vit-Fe Fumarate-FA (PRENATAL  COMPLETE) 14-0.4 MG TABS; Take 1 tablet by mouth daily.  Dispense: 60 tablet; Refill: 6 - aspirin  EC 81 MG tablet; Take 2 tablets (162 mg total) by mouth daily. Start taking when you are [redacted] weeks pregnant for rest of pregnancy for prevention of preeclampsia  Dispense: 300 tablet; Refill: 2  3. Hypertension affecting pregnancy in second trimester Discussed need for antihypertensive, if patient does not tolerate procardia , may need to transition to labetalol  - NIFEdipine  (PROCARDIA -XL/NIFEDICAL-XL) 30 MG 24 hr tablet; Take 1 tablet (30 mg total) by mouth daily.  Dispense: 30 tablet; Refill: 5  4. History of cesarean delivery Info given regarding VBAC versus repeat c/s, pt leaning toward VBAC  5. Hypertension affecting pregnancy, antepartum Baby ASA increased to 162 mg daily  - aspirin  EC 81 MG tablet; Take 2 tablets (162 mg total) by mouth daily. Start taking when you are [redacted] weeks pregnant for rest of pregnancy for prevention of preeclampsia  Dispense: 300 tablet; Refill: 2  Preterm labor symptoms and general obstetric precautions including but not limited to vaginal bleeding, contractions, leaking of fluid and fetal movement were reviewed in detail with the patient.  Please refer to After Visit Summary for other counseling recommendations.   Return in about 4 weeks (around 12/26/2023) for [facility], in [place].   Jerilynn Buddle, MD Faculty Attending Center for [facility]

## 2023-11-28 NOTE — Progress Notes (Signed)
 Patient stated I' not taking the procardia  because there is nothing wrong with my blood pressure

## 2023-11-30 LAB — AFP, SERUM, OPEN SPINA BIFIDA
AFP MoM: 0.77
AFP Value: 18.7 ng/mL
Gest. Age on Collection Date: 16.4 wk
Maternal Age At EDD: 33 a
OSBR Risk 1 IN: 10000
Test Results:: NEGATIVE
Weight: 338 [lb_av]

## 2023-11-30 LAB — HIV ANTIBODY (ROUTINE TESTING W REFLEX): HIV Screen 4th Generation wRfx: NONREACTIVE

## 2023-11-30 LAB — GLUCOSE TOLERANCE, 2 HOURS W/ 1HR
Glucose, 1 hour: 144 mg/dL (ref 70–179)
Glucose, 2 hour: 121 mg/dL (ref 70–152)
Glucose, Fasting: 82 mg/dL (ref 70–91)

## 2023-11-30 LAB — RPR: RPR Ser Ql: NONREACTIVE

## 2023-12-01 ENCOUNTER — Ambulatory Visit: Payer: Self-pay | Admitting: Obstetrics and Gynecology

## 2023-12-24 ENCOUNTER — Encounter (HOSPITAL_COMMUNITY): Payer: Self-pay | Admitting: Obstetrics and Gynecology

## 2023-12-24 ENCOUNTER — Inpatient Hospital Stay (HOSPITAL_COMMUNITY)
Admission: AD | Admit: 2023-12-24 | Discharge: 2023-12-24 | Disposition: A | Discharge status: Home / Self Care | Attending: Obstetrics and Gynecology | Admitting: Obstetrics and Gynecology

## 2023-12-24 ENCOUNTER — Other Ambulatory Visit: Payer: Self-pay

## 2023-12-24 DIAGNOSIS — Z3A20 20 weeks gestation of pregnancy: Secondary | ICD-10-CM

## 2023-12-24 DIAGNOSIS — O26892 Other specified pregnancy related conditions, second trimester: Secondary | ICD-10-CM | POA: Diagnosis not present

## 2023-12-24 DIAGNOSIS — G5600 Carpal tunnel syndrome, unspecified upper limb: Secondary | ICD-10-CM

## 2023-12-24 DIAGNOSIS — O26899 Other specified pregnancy related conditions, unspecified trimester: Secondary | ICD-10-CM

## 2023-12-24 NOTE — MAU Note (Signed)
 Teresa [name] is a 33 y.o. at [redacted]w[redacted]d here in MAU reporting: Right Hand Numbness and Tingling x 2 weeks  LMP: [id number] Onset of complaint: two weeks Pain score: 5/10 There were no vitals filed for this visit.   FHT: 154  Lab orders placed from triage: none

## 2023-12-24 NOTE — Discharge Instructions (Signed)
 Teresa [name],  You were seen in MAU for Carpal Tunnel in pregnancy. You may obtain a wrist brace from a local medical supply store (I like [facility]). You may also get a keyboard wrist support. I recommend Magnesium  Glycinate 400mg  nightly for promotion of rest.   They will not perform surgery for Carpal Tunnel in pregnancy, if it persists after pregnancy we can refer you for a consult for  surgery.    Thank you for trusting us  to care for you, Camie, Midwife

## 2023-12-24 NOTE — MAU Provider Note (Signed)
 None     [name] is a 33 y.o. G43P2002 pregnant female at [redacted]w[redacted]d who presents to MAU today with complaint of numbness and tingling of her right hand. She reports that this has been a recurrent problem, sometimes occurring in both hands for the past few weeks. She reports it is just the right hand today and that it got worse overnight. She denies recent injury, numbness or tingling that travels up either arm, chest pain, HA or other concerns.   She reports the discomfort predominantly occurs at night.   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   Receives care at [facility]. Prenatal records reviewed.  Pertinent items noted in HPI and remainder of comprehensive ROS otherwise negative.   O BP (!) 140/79 (BP Location: Right Arm)   Pulse (!) 114   Temp 98.2 F (36.8 C) (Oral)   Resp (!) 22   Ht 5' 3 (1.6 m)   Wt (!) 151.5 kg   LMP 07/17/2023 (Approximate)   SpO2 100%   BMI 59.17 kg/m  Physical Exam Vitals and nursing note reviewed.  Constitutional:      Appearance: Normal appearance.  HENT:     Head: Normocephalic.  Cardiovascular:     Rate and Rhythm: Normal rate and regular rhythm.     Pulses: Normal pulses.     Heart sounds: Normal heart sounds.  Pulmonary:     Effort: Pulmonary effort is normal.     Breath sounds: Normal breath sounds.  Musculoskeletal:     Right hand: Normal. No swelling, deformity, lacerations or tenderness. Normal range of motion. Normal strength. Normal sensation. Normal capillary refill.     Left hand: Normal. No swelling, deformity, lacerations or tenderness. Normal range of motion. Normal strength. Normal sensation. Normal capillary refill.  Skin:    General: Skin is warm and dry.     Capillary Refill: Capillary refill takes less than 2 seconds.   Neurological:     General: No focal deficit present.     Mental Status: She is alert and oriented to [place], place, and time.     Motor: Motor function is intact.     Coordination: Coordination is intact.  Psychiatric:        Mood and Affect: Mood normal.        Behavior: Behavior normal.        Thought Content: Thought content normal.        Judgment: Judgment normal.      MDM: Moderate PE, history  MAU Course:  A Carpal tunnel syndrome during pregnancy - Plan: Discharge patient  [redacted] weeks gestation of pregnancy  Medical screening exam complete  P Discharge from MAU in stable condition with routine precautions Follow [facility] at [facility] 12/26/2023 as scheduled for ongoing prenatal care - Discussion of symptom management: ergonomic work environment, wrist splints, positioning at night, heat/cold application. - Mild symptoms at this point, which are bothersome but do not interfere with ADLs. If symptoms persist/worsen oral steroids could be considered.   Allergies as of 12/24/2023       Reactions   Other Anaphylaxis   Grapes, strawberries, bananas, pineapple, oranges, nuts   Peanut (diagnostic) Anaphylaxis   Pineapple Anaphylaxis, Swelling   Betadine [povidone Iodine] Itching   Betadine causes itching and skin redness  and irritation   Povidone-iodine Itching, Rash, Dermatitis   Betadine causes itching and skin redness and irritation   Pollen Extract Itching   Sulfa  Antibiotics Itching   Bee Pollen Itching   Iodine Itching   Latex Itching   Tomato Itching, Rash   Tramadol  Nausea And Vomiting, Nausea Only        Medication List     TAKE these medications    aspirin  EC 81 MG tablet Take 2 tablets (162 mg total) by mouth daily. Start taking when you are [redacted] weeks pregnant for rest of pregnancy for prevention of preeclampsia   NIFEdipine  30 MG 24 hr tablet Commonly known as: PROCARDIA -XL/NIFEDICAL-XL Take 1 tablet (30 mg total) by mouth daily.   Prenatal Complete  14-0.4 MG Tabs Take 1 tablet by mouth daily.        Camie Rote, MSN, CNM 12/24/2023 12:39 PM  Certified Nurse Midwife, [facility]

## 2023-12-26 ENCOUNTER — Encounter: Payer: Self-pay | Admitting: Obstetrics & Gynecology

## 2023-12-26 ENCOUNTER — Ambulatory Visit: Admitting: Obstetrics & Gynecology

## 2023-12-26 ENCOUNTER — Ambulatory Visit

## 2023-12-26 ENCOUNTER — Other Ambulatory Visit: Payer: Self-pay

## 2023-12-26 ENCOUNTER — Ambulatory Visit: Attending: Obstetrics & Gynecology | Admitting: Obstetrics

## 2023-12-26 ENCOUNTER — Other Ambulatory Visit: Payer: Self-pay | Admitting: *Deleted

## 2023-12-26 VITALS — BP 125/82 | HR 95 | Wt 343.0 lb

## 2023-12-26 VITALS — BP 133/72 | HR 102

## 2023-12-26 DIAGNOSIS — Z3A20 20 weeks gestation of pregnancy: Secondary | ICD-10-CM | POA: Insufficient documentation

## 2023-12-26 DIAGNOSIS — O34219 Maternal care for unspecified type scar from previous cesarean delivery: Secondary | ICD-10-CM | POA: Diagnosis not present

## 2023-12-26 DIAGNOSIS — O0990 Supervision of high risk pregnancy, unspecified, unspecified trimester: Secondary | ICD-10-CM

## 2023-12-26 DIAGNOSIS — E669 Obesity, unspecified: Secondary | ICD-10-CM

## 2023-12-26 DIAGNOSIS — O162 Unspecified maternal hypertension, second trimester: Secondary | ICD-10-CM | POA: Diagnosis not present

## 2023-12-26 DIAGNOSIS — O99212 Obesity complicating pregnancy, second trimester: Secondary | ICD-10-CM

## 2023-12-26 DIAGNOSIS — Z362 Encounter for other antenatal screening follow-up: Secondary | ICD-10-CM

## 2023-12-26 DIAGNOSIS — Z98891 History of uterine scar from previous surgery: Secondary | ICD-10-CM

## 2023-12-26 DIAGNOSIS — Z8632 Personal history of gestational diabetes: Secondary | ICD-10-CM

## 2023-12-26 DIAGNOSIS — O09892 Supervision of other high risk pregnancies, second trimester: Secondary | ICD-10-CM | POA: Diagnosis not present

## 2023-12-26 DIAGNOSIS — O0992 Supervision of high risk pregnancy, unspecified, second trimester: Secondary | ICD-10-CM

## 2023-12-26 DIAGNOSIS — O99810 Abnormal glucose complicating pregnancy: Secondary | ICD-10-CM

## 2023-12-26 DIAGNOSIS — O09292 Supervision of pregnancy with other poor reproductive or obstetric history, second trimester: Secondary | ICD-10-CM | POA: Diagnosis not present

## 2023-12-26 DIAGNOSIS — O26892 Other specified pregnancy related conditions, second trimester: Secondary | ICD-10-CM

## 2023-12-26 DIAGNOSIS — Z363 Encounter for antenatal screening for malformations: Secondary | ICD-10-CM | POA: Insufficient documentation

## 2023-12-26 DIAGNOSIS — O10012 Pre-existing essential hypertension complicating pregnancy, second trimester: Secondary | ICD-10-CM

## 2023-12-26 DIAGNOSIS — O26899 Other specified pregnancy related conditions, unspecified trimester: Secondary | ICD-10-CM

## 2023-12-26 DIAGNOSIS — G5600 Carpal tunnel syndrome, unspecified upper limb: Secondary | ICD-10-CM

## 2023-12-26 NOTE — Progress Notes (Signed)
 MFM Consult Note  Teresa [name] is currently at 20 weeks and 4 days.  She was seen due to maternal obesity with a BMI of 53.9 and chronic hypertension treated with Procardia .  This is a short interval pregnancy.  She denies any problems in her current pregnancy.    She had a cell free DNA test earlier in her pregnancy which indicated a low risk for trisomy 31, 77, and 13. A female fetus is predicted.  She was informed that the fetal growth and amniotic fluid level were appropriate for her gestational age.   The views of the fetal anatomy were limited today due to maternal body habitus and the fetal position.  What was visualized today appeared within normal limits.  The patient was informed that anomalies may be missed due to technical limitations. If the fetus is in a suboptimal position or maternal habitus is increased, visualization of the fetus in the maternal uterus may be impaired.  Due to maternal obesity and chronic hypertension, we will continue to follow her with growth ultrasounds throughout her pregnancy.    The increased risk of superimposed preeclampsia due to chronic hypertension was discussed.    She was advised to continue taking 2 tablets of baby aspirin  daily for preeclampsia prophylaxis.  Weekly fetal testing should be started at around 32 weeks.  A follow-up exam was scheduled in 4 weeks to assess the fetal growth and to complete the views of the fetal anatomy.    The patient stated that all of her questions were answered today.  A total of 30 minutes was spent counseling and coordinating the care for this patient.  Greater than 50% of the time was spent in direct face-to-face contact.

## 2023-12-26 NOTE — Progress Notes (Signed)
   PRENATAL VISIT NOTE  Subjective:  Teresa [name] is a 33 y.o. G3P2002 at [redacted]w[redacted]d being seen today for ongoing prenatal care.  She is currently monitored for the following issues for this high-risk pregnancy and has Supervision of high risk pregnancy, antepartum; Hypertension affecting pregnancy; PCOS (polycystic ovarian syndrome); History of cesarean delivery; and History of gestational diabetes on their problem list.  Patient reports carpal tunnel symptoms.  Contractions: Not present. Vag. Bleeding: None.  Movement: Present. Denies leaking of fluid.   The following portions of the patient's history were reviewed and updated as appropriate: allergies, current medications, past family history, past medical history, past social history, past surgical history and problem list.   Objective:    Vitals:   12/26/23 1114  BP: 125/82  Pulse: 95  Weight: (!) 343 lb (155.6 kg)    Fetal Status:  Fetal Heart Rate (bpm): 140   Movement: Present    General: Alert, oriented and cooperative. Patient is in no acute distress.  Skin: Skin is warm and dry. No rash noted.   Cardiovascular: Normal heart rate noted  Respiratory: Normal respiratory effort, no problems with respiration noted  Abdomen: Soft, gravid, appropriate for gestational age.  Pain/Pressure: Present (tightness in stomach when walking)     Pelvic: Cervical exam deferred        Extremities: Normal range of motion.  Edema: None  Mental Status: Normal mood and affect. Normal behavior. Normal judgment and thought content.   Assessment and Plan:  Pregnancy: G3P2002 at [redacted]w[redacted]d 1. History of gestational diabetes (Primary)   2. Hypertension affecting pregnancy in second trimester   3. History of cesarean delivery   4. Supervision of high risk pregnancy, antepartum   Preterm labor symptoms and general obstetric precautions including but not limited to vaginal bleeding, contractions, leaking of fluid and fetal movement were reviewed in  detail with the patient. Please refer to After Visit Summary for other counseling recommendations.   Return in about 4 weeks (around 01/23/2024).  Future Appointments  Date Time Provider Department Center  01/23/2024  7:15 AM [facility] PROVIDER 1 [facility]  01/23/2024  7:30 AM [facility] US1 [facility]    Lynwood Solomons, MD

## 2024-01-14 ENCOUNTER — Inpatient Hospital Stay (HOSPITAL_COMMUNITY)
Admission: AD | Admit: 2024-01-14 | Discharge: 2024-01-15 | Disposition: A | Discharge status: Home / Self Care | Attending: Obstetrics and Gynecology | Admitting: Obstetrics and Gynecology

## 2024-01-14 DIAGNOSIS — W182XXA Fall in (into) shower or empty bathtub, initial encounter: Secondary | ICD-10-CM | POA: Diagnosis not present

## 2024-01-14 DIAGNOSIS — M25561 Pain in right knee: Secondary | ICD-10-CM | POA: Diagnosis not present

## 2024-01-14 DIAGNOSIS — Y929 Unspecified place or not applicable: Secondary | ICD-10-CM | POA: Insufficient documentation

## 2024-01-14 DIAGNOSIS — R0781 Pleurodynia: Secondary | ICD-10-CM | POA: Insufficient documentation

## 2024-01-14 DIAGNOSIS — Z3A23 23 weeks gestation of pregnancy: Secondary | ICD-10-CM | POA: Diagnosis not present

## 2024-01-14 DIAGNOSIS — Z043 Encounter for examination and observation following other accident: Secondary | ICD-10-CM | POA: Insufficient documentation

## 2024-01-14 DIAGNOSIS — O26892 Other specified pregnancy related conditions, second trimester: Secondary | ICD-10-CM | POA: Insufficient documentation

## 2024-01-14 DIAGNOSIS — Y9389 Activity, other specified: Secondary | ICD-10-CM | POA: Insufficient documentation

## 2024-01-14 DIAGNOSIS — W19XXXA Unspecified fall, initial encounter: Secondary | ICD-10-CM | POA: Diagnosis not present

## 2024-01-14 DIAGNOSIS — O26893 Other specified pregnancy related conditions, third trimester: Secondary | ICD-10-CM | POA: Diagnosis not present

## 2024-01-14 NOTE — MAU Note (Signed)
 MAU Triage Note:   .Teresa [name] is a 33 y.o. at 104w2d here in MAU reporting: fell in the shower around 1 hour ago. Hit her right side and is having right knee, shoulder, and abdominal pain. Reports no FM since the fall. Denies VB or LOF.  Patient complaint: pt fell has body pain  Pain Score: 8  Pain Location: Knee Pain Score: 8 Pain Location: Shoulder   Onset of complaint: around 1 hour ago LMP: Patient's last menstrual period was 07/17/2023 (approximate).  Vitals:   01/15/24 0000 01/15/24 0004  BP: 128/72   Pulse: 100   Resp: 20   Temp: 98.6 F (37 C)   SpO2:  98%    FHT:  Fetal Heart Rate Mode: External Baseline Rate (A): 145 bpm Lab orders placed from triage: UA

## 2024-01-15 ENCOUNTER — Inpatient Hospital Stay (HOSPITAL_BASED_OUTPATIENT_CLINIC_OR_DEPARTMENT_OTHER)

## 2024-01-15 ENCOUNTER — Inpatient Hospital Stay (HOSPITAL_COMMUNITY)

## 2024-01-15 ENCOUNTER — Encounter (HOSPITAL_COMMUNITY): Payer: Self-pay | Admitting: Obstetrics and Gynecology

## 2024-01-15 DIAGNOSIS — Z3A23 23 weeks gestation of pregnancy: Secondary | ICD-10-CM

## 2024-01-15 DIAGNOSIS — O09212 Supervision of pregnancy with history of pre-term labor, second trimester: Secondary | ICD-10-CM

## 2024-01-15 DIAGNOSIS — O10012 Pre-existing essential hypertension complicating pregnancy, second trimester: Secondary | ICD-10-CM

## 2024-01-15 DIAGNOSIS — E669 Obesity, unspecified: Secondary | ICD-10-CM | POA: Diagnosis not present

## 2024-01-15 DIAGNOSIS — W19XXXA Unspecified fall, initial encounter: Secondary | ICD-10-CM | POA: Diagnosis not present

## 2024-01-15 DIAGNOSIS — O9A212 Injury, poisoning and certain other consequences of external causes complicating pregnancy, second trimester: Secondary | ICD-10-CM | POA: Diagnosis not present

## 2024-01-15 DIAGNOSIS — O26893 Other specified pregnancy related conditions, third trimester: Secondary | ICD-10-CM

## 2024-01-15 LAB — URINALYSIS, ROUTINE W REFLEX MICROSCOPIC
Bilirubin Urine: NEGATIVE
Glucose, UA: NEGATIVE mg/dL
Hgb urine dipstick: NEGATIVE
Ketones, ur: NEGATIVE mg/dL
Leukocytes,Ua: NEGATIVE
Nitrite: NEGATIVE
Protein, ur: 100 mg/dL — AB
Specific Gravity, Urine: 1.026 (ref 1.005–1.030)
pH: 5 (ref 5.0–8.0)

## 2024-01-15 MED ORDER — CYCLOBENZAPRINE HCL 5 MG PO TABS
5.0000 mg | ORAL_TABLET | Freq: Once | ORAL | Status: DC
Start: 2024-01-15 — End: 2024-01-15

## 2024-01-15 MED ORDER — CYCLOBENZAPRINE HCL 5 MG PO TABS: 5.0000 mg | ORAL_TABLET | Freq: Three times a day (TID) | ORAL | 0 refills | Status: DC | PRN

## 2024-01-15 MED ORDER — ACETAMINOPHEN 500 MG PO TABS: 1000.0000 mg | ORAL_TABLET | Freq: Four times a day (QID) | ORAL | Status: DC | PRN

## 2024-01-15 MED ORDER — ACETAMINOPHEN 500 MG PO TABS
1000.0000 mg | ORAL_TABLET | Freq: Once | ORAL | Status: AC
Start: 2024-01-15 — End: 2024-01-15
  Administered 2024-01-15: 1000 mg via ORAL
  Filled 2024-01-15: qty 2

## 2024-01-15 NOTE — MAU Provider Note (Signed)
 History     [id number]  Arrival date and time: 01/14/24 2338    Chief Complaint  Patient presents with   Fall     HPI Teresa [name] is a 33 y.o. at [redacted]w[redacted]d by 8w US , who presents for fall. She was in the shower when she slipped due to the tub being slippery with soap and landed on her right side. She denies loss of consciousness or syncopal episode. She landed on her right ribs and right knee. She complains of pain in those areas. She avoided a direct hit to her abdomen per her report. She reports good FM, denies vaginal bleeding or leakage of fluid.    --/--/O POS (05/05 0950)  Past Medical History:  Diagnosis Date   Abnormal Pap smear    Acid reflux    ADHD (attention deficit hyperactivity disorder)    Asthma    childhood /grew out of it   Breast mass, right 01/11/2014   Negative breast imaging. Radiologist at [facility] states cannot see or feel mass.      Chlamydia    Chronic cystitis 07/02/2018   Degenerative disk disease    Depression    post partem   Dysmenorrhea 04/15/2012   Fractured fibula    left   History of PCOS    Migraine    Trichomonas    Urinary tract infection    Vaginal Pap smear, abnormal    normal since    Past Surgical History:  Procedure Laterality Date   CESAREAN SECTION     DILATION AND CURETTAGE OF UTERUS     INDUCED ABORTION     TONSILLECTOMY AND ADENOIDECTOMY N/A 08/02/2020   Procedure: TONSILLECTOMY AND ADENOIDECTOMY;  Surgeon: Karis Clunes, MD;  Location: [facility] OR;  Service: ENT;  Laterality: N/A;   VAGINAL DELIVERY  2013   WISDOM TOOTH EXTRACTION      Family History  Problem Relation Age of Onset   Diabetes Mother    Hypertension Mother    Heart disease Mother    Diabetes Maternal Grandfather    Heart disease Maternal Grandmother        chf   Anesthesia problems Neg Hx    Hypotension Neg Hx    Malignant hyperthermia Neg Hx    Pseudochol deficiency Neg Hx     Social History   Socioeconomic History   Marital status:  Single    Spouse name: Not on file   Number of children: Not on file   Years of education: Not on file   Highest education level: Not on file  Occupational History   Not on file  Tobacco Use   Smoking status: Former    Current packs/day: 0.50    Average packs/day: 0.5 packs/day for 7.0 years (3.5 ttl pk-yrs)    Types: Cigarettes   Smokeless tobacco: Never  Vaping Use   Vaping status: Never Used  Substance and Sexual Activity   Alcohol use: Not Currently    Alcohol/week: 1.0 standard drink of alcohol    Types: 1 Shots of liquor per week    Comment: occ but was a heavy user in 2015 due to boyfriend passed away.   Drug use: Not Currently    Types: Marijuana    Comment: last 10/15/23   Sexual activity: Yes    Birth control/protection: Condom    Comment: last intercourse 3 weeks ago  Other Topics Concern   Not on file  Social History Narrative   Not on file   Social  Drivers of Corporate investment banker Strain: Not on file  Food Insecurity: No Food Insecurity (01/15/2024)   Hunger Vital Sign    Worried About Running Out of Food in the Last Year: Never true    Ran Out of Food in the Last Year: Never true  Transportation Needs: No Transportation Needs (01/15/2024)   PRAPARE - Administrator, Civil Service (Medical): No    Lack of Transportation (Non-Medical): No  Physical Activity: Not on file  Stress: No Stress Concern Present (03/05/2021)   Received from [facility] - Occupational Stress Questionnaire    Feeling of Stress : Not at all  Social Connections: Unknown (12/24/2023)   Social Connection and Isolation Panel    Frequency of Communication with Friends and Family: More than three times a week    Frequency of Social Gatherings with Friends and Family: More than three times a week    Attends Religious Services: Patient declined    Database administrator or Organizations: Patient declined    Attends  Banker Meetings: Patient declined    Marital Status: Never married  Intimate Partner Violence: Not At Risk (01/15/2024)   Humiliation, Afraid, Rape, and Kick questionnaire    Fear of Current or Ex-Partner: No    Emotionally Abused: No    Physically Abused: No    Sexually Abused: No    Allergies  Allergen Reactions   Other Anaphylaxis    Grapes, strawberries, bananas, pineapple, oranges, nuts   Peanut (Diagnostic) Anaphylaxis   Pineapple Anaphylaxis and Swelling   Betadine [Povidone Iodine] Itching    Betadine causes itching and skin redness and irritation   Povidone-Iodine Itching, Rash and Dermatitis    Betadine causes itching and skin redness and irritation   Pollen Extract Itching   Sulfa  Antibiotics Itching   Bee Pollen Itching   Iodine Itching   Latex Itching   Tomato Itching and Rash   Tramadol  Nausea And Vomiting and Nausea Only    No current facility-administered medications on file prior to encounter.   Current Outpatient Medications on File Prior to Encounter  Medication Sig Dispense Refill   aspirin  EC 81 MG tablet Take 2 tablets (162 mg total) by mouth daily. Start taking when you are [redacted] weeks pregnant for rest of pregnancy for prevention of preeclampsia 300 tablet 2   NIFEdipine  (PROCARDIA -XL/NIFEDICAL-XL) 30 MG 24 hr tablet Take 1 tablet (30 mg total) by mouth daily. 30 tablet 5   Prenatal Vit-Fe Fumarate-FA (PRENATAL COMPLETE) 14-0.4 MG TABS Take 1 tablet by mouth daily. 60 tablet 6    Pertinent positives and negative per HPI, all others reviewed and negative  Physical Exam   BP 128/72   Pulse 100   Temp 98.6 F (37 C) (Oral)   Resp 20   Ht 5' 2 (1.575 m)   Wt (!) 160.6 kg   LMP 07/17/2023 (Approximate)   SpO2 98%   BMI 64.77 kg/m   Patient Vitals for the past 24 hrs:  BP Temp Temp src Pulse Resp SpO2 Height Weight  01/15/24 0004 -- -- -- -- -- 98 % -- --  01/15/24 0000 128/72 98.6 F (37 C) Oral 100 20 -- -- --  01/14/24 2354 --  -- -- -- -- -- 5' 2 (1.575 m) (!) 160.6 kg    Physical Exam:  Chest wall: Tenderness to palpation of the right side along ribcage and flank. No evidence of  bony abnormality on palpation Abdomen: Obese soft without tenderness.    FHT Baseline: 135 bpm Variability: Good {> 6 bpm) Accelerations: Non-reactive but appropriate for gestational age Decelerations: Absent Uterine activity: None   Labs Results for orders placed or performed during the hospital encounter of 01/14/24 (from the past 24 hours)  Urinalysis, Routine w reflex microscopic -Urine, Clean Catch     Status: Abnormal   Collection Time: 01/14/24 11:57 PM  Result Value Ref Range   Color, Urine YELLOW YELLOW   APPearance HAZY (A) CLEAR   Specific Gravity, Urine 1.026 1.005 - 1.030   pH 5.0 5.0 - 8.0   Glucose, UA NEGATIVE NEGATIVE mg/dL   Hgb urine dipstick NEGATIVE NEGATIVE   Bilirubin Urine NEGATIVE NEGATIVE   Ketones, ur NEGATIVE NEGATIVE mg/dL   Protein, ur 899 (A) NEGATIVE mg/dL   Nitrite NEGATIVE NEGATIVE   Leukocytes,Ua NEGATIVE NEGATIVE   RBC / HPF 0-5 0 - 5 RBC/hpf   WBC, UA 0-5 0 - 5 WBC/hpf   Bacteria, UA RARE (A) NONE SEEN   Squamous Epithelial / HPF 6-10 0 - 5 /HPF   Mucus PRESENT     Imaging No results found.  MAU Course  Procedures  Lab Orders         Urinalysis, Routine w reflex microscopic -Urine, Clean Catch    Meds ordered this encounter  Medications   acetaminophen  (TYLENOL ) tablet 1,000 mg   DISCONTD: cyclobenzaprine  (FLEXERIL ) tablet 5 mg   acetaminophen  (TYLENOL ) 500 MG tablet    Sig: Take 2 tablets (1,000 mg total) by mouth every 6 (six) hours as needed.   cyclobenzaprine  (FLEXERIL ) 5 MG tablet    Sig: Take 1 tablet (5 mg total) by mouth 3 (three) times daily as needed for muscle spasms.    Dispense:  10 tablet    Refill:  0    Imaging Orders         DG Knee 1-2 Views Right         DG Ribs Unilateral Right         US  MFM OB LIMITED     MDM Moderate (Level  3-4)  Assessment and Plan  #Fall #[redacted] weeks gestation of pregnancy   #FWB Good variability, appropriate for gestational age  Teresa [name] is a 33 y.o. at [redacted]w[redacted]d by 8w US , who presents for fall.  -Endorses good FM, denies vaginal bleeding, leakage of fluid or contractions -Denies LOC -Tenderness to palpation of ribs on the right side and knee. Full ROM in right knee and able to ambulate. No obvious deformities, bruising or breaks in skin upon exam.  -Declines x-rays to evaluate for bony trauma stating she just wanted to make sure baby was ok. -US  preliminary report shows no evidence of abruption.   -Safe to discharge home -All questions answered, anticipatory guidance and detailed return precautions provided.     Charly Holcomb L Denise Bramblett, MD/MHA 01/15/24 2:25 AM  Allergies as of 01/15/2024       Reactions   Other Anaphylaxis   Grapes, strawberries, bananas, pineapple, oranges, nuts   Peanut (diagnostic) Anaphylaxis   Pineapple Anaphylaxis, Swelling   Betadine [povidone Iodine] Itching   Betadine causes itching and skin redness and irritation   Povidone-iodine Itching, Rash, Dermatitis   Betadine causes itching and skin redness and irritation   Pollen Extract Itching   Sulfa  Antibiotics Itching   Bee Pollen Itching   Iodine Itching   Latex Itching   Tomato Itching, Rash   Tramadol  Nausea  And Vomiting, Nausea Only        Medication List     TAKE these medications    acetaminophen  500 MG tablet Commonly known as: TYLENOL  Take 2 tablets (1,000 mg total) by mouth every 6 (six) hours as needed.   aspirin  EC 81 MG tablet Take 2 tablets (162 mg total) by mouth daily. Start taking when you are [redacted] weeks pregnant for rest of pregnancy for prevention of preeclampsia   cyclobenzaprine  5 MG tablet Commonly known as: FLEXERIL  Take 1 tablet (5 mg total) by mouth 3 (three) times daily as needed for muscle spasms.   NIFEdipine  30 MG 24 hr tablet Commonly known as:  PROCARDIA -XL/NIFEDICAL-XL Take 1 tablet (30 mg total) by mouth daily.   Prenatal Complete 14-0.4 MG Tabs Take 1 tablet by mouth daily.

## 2024-01-23 ENCOUNTER — Other Ambulatory Visit (HOSPITAL_COMMUNITY)
Admission: RE | Admit: 2024-01-23 | Discharge: 2024-01-23 | Disposition: A | Discharge status: Home / Self Care | Source: Ambulatory Visit | Attending: Obstetrics & Gynecology | Admitting: Obstetrics & Gynecology

## 2024-01-23 ENCOUNTER — Ambulatory Visit: Admitting: Obstetrics & Gynecology

## 2024-01-23 ENCOUNTER — Ambulatory Visit

## 2024-01-23 ENCOUNTER — Ambulatory Visit: Attending: Obstetrics | Admitting: Obstetrics

## 2024-01-23 ENCOUNTER — Other Ambulatory Visit: Payer: Self-pay | Admitting: *Deleted

## 2024-01-23 VITALS — BP 125/78 | HR 96 | Wt 355.7 lb

## 2024-01-23 VITALS — BP 127/57 | HR 103

## 2024-01-23 DIAGNOSIS — Z98891 History of uterine scar from previous surgery: Secondary | ICD-10-CM

## 2024-01-23 DIAGNOSIS — O0990 Supervision of high risk pregnancy, unspecified, unspecified trimester: Secondary | ICD-10-CM

## 2024-01-23 DIAGNOSIS — O10012 Pre-existing essential hypertension complicating pregnancy, second trimester: Secondary | ICD-10-CM | POA: Insufficient documentation

## 2024-01-23 DIAGNOSIS — O162 Unspecified maternal hypertension, second trimester: Secondary | ICD-10-CM

## 2024-01-23 DIAGNOSIS — O09292 Supervision of pregnancy with other poor reproductive or obstetric history, second trimester: Secondary | ICD-10-CM | POA: Diagnosis not present

## 2024-01-23 DIAGNOSIS — O99212 Obesity complicating pregnancy, second trimester: Secondary | ICD-10-CM | POA: Insufficient documentation

## 2024-01-23 DIAGNOSIS — Z8632 Personal history of gestational diabetes: Secondary | ICD-10-CM | POA: Insufficient documentation

## 2024-01-23 DIAGNOSIS — O99810 Abnormal glucose complicating pregnancy: Secondary | ICD-10-CM | POA: Diagnosis not present

## 2024-01-23 DIAGNOSIS — O0992 Supervision of high risk pregnancy, unspecified, second trimester: Secondary | ICD-10-CM

## 2024-01-23 DIAGNOSIS — Z3A24 24 weeks gestation of pregnancy: Secondary | ICD-10-CM | POA: Diagnosis not present

## 2024-01-23 DIAGNOSIS — O23592 Infection of other part of genital tract in pregnancy, second trimester: Secondary | ICD-10-CM | POA: Diagnosis present

## 2024-01-23 DIAGNOSIS — O09892 Supervision of other high risk pregnancies, second trimester: Secondary | ICD-10-CM | POA: Diagnosis not present

## 2024-01-23 DIAGNOSIS — O34219 Maternal care for unspecified type scar from previous cesarean delivery: Secondary | ICD-10-CM | POA: Insufficient documentation

## 2024-01-23 DIAGNOSIS — E669 Obesity, unspecified: Secondary | ICD-10-CM

## 2024-01-23 DIAGNOSIS — Z362 Encounter for other antenatal screening follow-up: Secondary | ICD-10-CM | POA: Diagnosis present

## 2024-01-23 NOTE — Progress Notes (Signed)
 PRENATAL VISIT NOTE  Subjective:  Teresa [name] is a 33 y.o. G3P2002 at [redacted]w[redacted]d being seen today for ongoing prenatal care.  She is currently monitored for the following issues for this high-risk pregnancy and has Supervision of high risk pregnancy, antepartum; Hypertension affecting pregnancy; PCOS (polycystic ovarian syndrome); History of cesarean delivery; and History of gestational diabetes on their problem list.  Patient reports vaginal irritation and discharge.  Contractions: Irritability. Vag. Bleeding: None.  Movement: Present. Denies leaking of fluid.   The following portions of the patient's history were reviewed and updated as appropriate: allergies, current medications, past family history, past medical history, past social history, past surgical history and problem list.   Objective:    Vitals:   01/23/24 0917  BP: 125/78  Pulse: 96  Weight: (!) 355 lb 11.2 oz (161.3 kg)    Fetal Status:      Movement: Present    General: Alert, oriented and cooperative. Patient is in no acute distress.  Skin: Skin is warm and dry. No rash noted.   Cardiovascular: Normal heart rate noted  Respiratory: Normal respiratory effort, no problems with respiration noted  Abdomen: Soft, gravid, appropriate for gestational age.  Pain/Pressure: Absent     Pelvic: Cervical exam deferred        Extremities: Normal range of motion.  Edema: Trace  Mental Status: Normal mood and affect. Normal behavior. Normal judgment and thought content.    US  MFM OB LIMITED Result Date: 01/15/2024 ----------------------------------------------------------------------  OBSTETRICS REPORT                        (Signed Final 01/15/2024 03:41 pm) ---------------------------------------------------------------------- Patient Info  ID #:       [id number]                          D.O.B.:  [birth year] (32 yrs)(F)  Name:       Teresa [name]                Visit Date: 01/15/2024 01:12 am  ---------------------------------------------------------------------- Performed By  Attending:        Delora Smaller DO       Ref. Address:      [address]                                                              [place], [place]                                                              [address]  Performed By:     Mardy Heller          [facility]  Referred By:      [facility] ---------------------------------------------------------------------- Orders  #  Description                           Code        Ordered By  1  US  MFM OB LIMITED                     [id number]    COLTER CASHION ----------------------------------------------------------------------  #  Order #                     Accession #                Episode #  1  [id number]                   [id number]                 [id number] ---------------------------------------------------------------------- Indications  Traumatic injury during pregnancy (fall)        O9A.219 T14.90  Obesity complicating pregnancy, second          O99.212  trimester (BMI 53.94)  Pre-existing essential hypertension             O10.012  complicating pregnancy, second trimester  (Procardia )  Short interval between pregnancies              O09.899  complicating pregnancy, antepartum  Previous cesarean delivery x1 (G2 - breech)     O34.219  Poor obstetric history: Previous gestational    O09.299  diabetes  Prediabetes in mother during pregnancy          O99.810  Early GCT Pass - A1c 5.9  Neg AFP - Horizon/NIPS needs to be  collected  [redacted] weeks gestation of pregnancy                 Z3A.23 ---------------------------------------------------------------------- Fetal Evaluation  Num Of Fetuses:          1  Fetal Heart Rate(bpm):   144  Cardiac Activity:        Observed  Presentation:            Transverse, head to maternal right  Placenta:                 Posterior  P. Cord Insertion:       Previously seen  Amniotic Fluid  AFI FV:      Within normal limits                              Largest Pocket(cm)                              5.5  Comment:    No placental abruption or previa identified. stomach, bladder,              and diaphragm noted. ---------------------------------------------------------------------- OB History  Gravidity:    3         Term:   2  Living:       2 ---------------------------------------------------------------------- Gestational Age  LMP:           26w 0d        Date:  07/17/23                  EDD:   04/22/24  Best:          23w 3d     Det. By:  Early Ultrasound         EDD:   05/10/24                                      (09/15/23) ---------------------------------------------------------------------- Cervix Uterus Adnexa  Cervix  Length:            4.1  cm.  Normal appearance by transabdominal scan  Uterus  No abnormality visualized.  Right Ovary  Not visualized.  Left Ovary  Within normal limits.  Adnexa  No abnormality visualized ---------------------------------------------------------------------- Comments  Hospital Ultrasound  The patient presented to the MAU for assessment after a mild  fall.  Sonographic findings  Single intrauterine pregnancy at 23w 3d  Fetal cardiac activity: Observed and appears normal.  Presentation: Transverse, head to maternal right.  Limited fetal anatomy appears normal.  Amniotic fluid: Within normal limits.  MVP: 5.5 cm.  Placenta: Posterior. There is no sonographic evidence of  bleeding.  Recommendations  - EDD is 05/10/2024 dated by Early Ultrasound  (09/15/23).  - While there is no sonogrpahic evidence of placental  bleeding, placental abruption is a clinical diagnosis and  ultrasound findings of placental beeding are seen in less than  25% of cases.  - Continue clinical management per OB provider.  This was a limited ultrasound with a remote read. If an official  MFM consult is requested for any  reason please call/place an  order in Epic. ----------------------------------------------------------------------                  Delora Smaller, DO Electronically Signed Final Report   01/15/2024 03:41 pm ----------------------------------------------------------------------   US  MFM OB DETAIL +14 WK Result Date: 12/26/2023 ----------------------------------------------------------------------  OBSTETRICS REPORT                       (Signed Final 12/26/2023 01:45 pm) ---------------------------------------------------------------------- Patient Info  ID #:       [id number]                          D.O.B.:  [birth year] (32 yrs)(F)  Name:       ATHELENE HURSEY Ladley                Visit Date: 12/26/2023 09:25 am ---------------------------------------------------------------------- Performed By  Attending:        Steffan Keys MD         Ref. Address:     [address]                                                             [place], [place]  [address]  Performed By:     Rumaldo Sharps RDMS      [facility]  Referred By:      [facility] ---------------------------------------------------------------------- Orders  #  Description                           Code        Ordered By  1  US  MFM OB DETAIL +14 WK               76811.01    LYNWOOD SOLOMONS ----------------------------------------------------------------------  #  Order #                     Accession #                Episode #  1  [id number]                   [id number]                 [id number] ---------------------------------------------------------------------- Indications  Obesity complicating pregnancy, second         O99.212  trimester (BMI  53.94)  Pre-existing essential hypertension            O10.012  complicating pregnancy, second trimester  (Procardia )  Short interval between pregnancies             O09.899  complicating pregnancy, antepartum  Previous cesarean delivery x1 (G2 - breech)    O34.219  Poor obstetric history: Previous gestational   O09.299  diabetes  Prediabetes in mother during pregnancy         O99.810  Early GCT Pass - A1c 5.9  Neg AFP - Horizon/NIPS needs to be  collected  Encounter for antenatal screening for          Z36.3  malformations  [redacted] weeks gestation of pregnancy                Z3A.20 ---------------------------------------------------------------------- Vital Signs  BP:          133/72 ---------------------------------------------------------------------- Fetal Evaluation  Num Of Fetuses:         1  Fetal Heart Rate(bpm):  133  Cardiac Activity:       Observed  Presentation:           Breech  Placenta:               Posterior  Amniotic Fluid  AFI FV:      Within normal limits                              Largest Pocket(cm)                              6.58 ---------------------------------------------------------------------- Biometry  BPD:      50.1  mm     G. Age:  21w 1d         73  %    CI:        69.64   %    70 - 86                                                          FL/HC:      18.0   %    15.9 - 20.3  HC:      191.6  mm     G. Age:  21w 3d         78  %    HC/AC:      1.18        1.06 - 1.25  AC:       162   mm     G. Age:  21w 2d         67  %    FL/BPD:     68.9   %  FL:       34.5  mm     G. Age:  20w 6d         52  %    FL/AC:      21.3   %    20 - 24  HUM:      34.2  mm     G. Age:  21w 5d         78  %  CER:      21.8  mm     G. Age:  20w 4d         65  %  NFT:       6.3  mm  LV:          5  mm  Est. FW:     401  gm    0 lb 14 oz      76  % ---------------------------------------------------------------------- OB History  Gravidity:    3         Term:   2  Living:       2  ---------------------------------------------------------------------- Gestational Age  LMP:           23w 1d        Date:  07/17/23                  EDD:   04/22/24  U/S Today:     21w 1d  EDD:   05/06/24  Best:          governor 4d     Det. By:  Early Ultrasound         EDD:   05/10/24                                      (09/15/23) ---------------------------------------------------------------------- Targeted Anatomy  Central Nervous System  Calvarium/Cranial V.:  Appears normal         Cereb./Vermis:          Appears normal  Cavum:                 Appears normal         Cisterna Magna:         Appears normal  Lateral Ventricles:    Appears normal         Midline Falx:           Appears normal  Choroid Plexus:        Appears normal  Spine  Cervical:              Appears normal         Sacral:                 Appears normal  Thoracic:              Appears normal         Shape/Curvature:        Appears normal  Lumbar:                Appears normal  Head/Neck  Lips:                  Not well visualized    Profile:                Not well visualized  Neck:                  Not well visualized    Orbits/Eyes:            Appears normal  Nuchal Fold:           Not applicable         Mandible:               Appears normal  Nasal Bone:            Not well visualized    Maxilla:                Appears normal  Thorax  4 Chamber View:        Not well visualized    Interventr. Septum:     Not well visualized  Cardiac Rhythm:        Normal                 Cardiac Axis:           Not well visualized  Cardiac Situs:         Appears normal         Diaphragm:              Appears normal  Rt Outflow Tract:      Not well visualized    3 Vessel View:          Not well visualized  Lt Outflow Tract:  Not well visualized    3 V Trachea View:       Not well visualized  Aortic Arch:           Not well visualized    IVC:                    Not well visualized  Ductal Arch:           Not well visualized     Crossing:               Not well visualized  SVC:                   Not well visualized  Abdomen  Ventral Wall:          Appears normal         Lt Kidney:              Appears normal  Cord Insertion:        Appears normal         Rt Kidney:              Appears normal  Situs:                 Appears normal         Bladder:                Appears normal  Stomach:               Appears normal  Extremities  Lt Humerus:            Appears normal         Lt Femur:               Appears normal  Rt Humerus:            Appears normal         Rt Femur:               Appears normal  Lt Forearm:            Appears normal         Lt Lower Leg:           Appears normal  Rt Forearm:            Appears normal         Rt Lower Leg:           Appears normal  Lt Hand:               Not well visualized    Lt Foot:                Nml foot; Heel NWV  Rt Hand:               Not well visualized    Rt Foot:                Nml foot; Heel NWV  Other  Umbilical Cord:        Normal 3-vessel        Genitalia:              Female-nml  Comment:     Technically difficult due to maternal habitus and fetal position. ---------------------------------------------------------------------- Cervix Uterus Adnexa  Cervix  Length:            3.2  cm.  Normal appearance by transabdominal scan  Uterus  No abnormality  visualized.  Right Ovary  Not visualized.  Left Ovary  Not visualized.  Cul De Sac  No free fluid seen.  Adnexa  No abnormality visualized ---------------------------------------------------------------------- Comments  Temperance Salce is currently at 20 weeks and 4 days.  She  was seen due to maternal obesity with a BMI of 53.9 and  chronic hypertension treated with Procardia .  This is a short  interval pregnancy.  She denies any problems in her current pregnancy.  She had a cell free DNA test earlier in her pregnancy which  indicated a low risk for trisomy 31, 74, and 13. A female fetus  is predicted.  She was informed that the fetal growth and  amniotic fluid  level were appropriate for her gestational age.  The views of the fetal anatomy were limited today due to  maternal body habitus and the fetal position.  What was  visualized today appeared within normal limits.  The patient was informed that anomalies may be missed due  to technical limitations. If the fetus is in a suboptimal position  or maternal habitus is increased, visualization of the fetus in  the maternal uterus may be impaired.  Due to maternal obesity and chronic hypertension, we will  continue to follow her with growth ultrasounds throughout her  pregnancy.  The increased risk of superimposed preeclampsia due to  chronic hypertension was discussed.  She was advised to continue taking 2 tablets of baby aspirin   daily for preeclampsia prophylaxis.  Weekly fetal testing should be started at around 32 weeks.  A follow-up exam was scheduled in 4 weeks to assess the  fetal growth and to complete the views of the fetal anatomy.  The patient stated that all of her questions were answered  today.  A total of 30 minutes was spent counseling and coordinating  the care for this patient.  Greater than 50% of the time was  spent in direct face-to-face contact. ----------------------------------------------------------------------                   Steffan Keys, MD Electronically Signed Final Report   12/26/2023 01:45 pm ----------------------------------------------------------------------    Assessment and Plan:  Pregnancy: [id number] at [redacted]w[redacted]d 1. Hypertension affecting pregnancy in second trimester (Primary) Stable BP on Procardia . Continue scans as per MFM.  2. History of gestational diabetes Normal early GTT, will check again next visit.  3. History of cesarean delivery Counseled regarding TOLAC vs RCS; risks/benefits discussed in detail. Concerned about increased risk of uterine rupture with her C/S being done in 02/2023, and high likelihood of needing IOL.  All questions answered.  Patient  is leaning towards TOLAC, consent given to her to review at home, she will sign next visit.    4. Vaginitis during pregnancy in second trimester - Cervicovaginal ancillary only done, will follow up results and manage accordingly.  5. [redacted] weeks gestation of pregnancy 6. Supervision of high risk pregnancy, antepartum No other concerning issues.  Preterm labor symptoms and general obstetric precautions including but not limited to vaginal bleeding, contractions, leaking of fluid and fetal movement were reviewed in detail with the patient. Please refer to After Visit Summary for other counseling recommendations.   Return in about 4 weeks (around 02/20/2024) for 2 hr GTT, 3rd trimester labs, TDap, OFFICE OB VISIT (MD only).  Future Appointments  Date Time Provider Department Center  01/23/2024 10:15 AM Herchel Gloris LABOR, MD [facility]  03/05/2024  9:15 AM [facility] PROVIDER 1 [facility]  03/05/2024  9:30 AM  [facility] US1 [facility]    Gloris Hugger, MD

## 2024-01-23 NOTE — Progress Notes (Signed)
 MFM Consult Note  Teresa [name] is currently at 24 weeks and 4 days.  She has been followed due to maternal obesity with a BMI of 53.9 and chronic hypertension treated with Procardia .  This is a short interval pregnancy.  She denies any problems since her last exam.  Sonographic findings Single intrauterine pregnancy at 24w 4d.  Fetal cardiac activity:  Observed and appears normal. Presentation: Cephalic. Interval fetal anatomy appears normal. Fetal biometry shows the estimated fetal weight of 1 pound 13 ounces which measures at the 80th percentile. Amniotic fluid volume: Within normal limits. MVP: 3.8 cm. Placenta: Posterior.  The views of the fetal anatomy remain limited today due to maternal body habitus.  What was visualized today appeared within normal limits.  There are limitations of prenatal ultrasound such as the inability to detect certain abnormalities due to poor visualization. Various factors such as fetal position, gestational age and maternal body habitus may increase the difficulty in visualizing the fetal anatomy.    Due to maternal obesity and chronic hypertension, we will continue to follow her with growth ultrasounds throughout her pregnancy.    Weekly fetal testing should be started at around 32 weeks.  Due to her underlying medical conditions, delivery should be considered at between 37 to 38 weeks.  She will return in 4 weeks for another ultrasound exam.  The patient stated that all of her questions were answered today.  A total of 20 minutes was spent counseling and coordinating the care for this patient.  Greater than 50% of the time was spent in direct face-to-face contact.

## 2024-01-23 NOTE — Patient Instructions (Signed)
TDaP Vaccine Pregnancy Get the Whooping Cough Vaccine While You Are Pregnant (CDC)  It is important for women to get the whooping cough vaccine in the third trimester of each pregnancy. Vaccines are the best way to prevent this disease. There are 2 different whooping cough vaccines. Both vaccines combine protection against whooping cough, tetanus and diphtheria, but they are for different age groups: Tdap: for everyone 11 years or older, including pregnant women  DTaP: for children 2 months through 52 years of age  You need the whooping cough vaccine during each of your pregnancies The recommended time to get the shot is during your 27th through 36th week of pregnancy, preferably during the earlier part of this time period. The Centers for Disease Control and Prevention (CDC) recommends that pregnant women receive the whooping cough vaccine for adolescents and adults (called Tdap vaccine) during the third trimester of each pregnancy. The recommended time to get the shot is during your 27th through 36th week of pregnancy, preferably during the earlier part of this time period. This replaces the original recommendation that pregnant women get the vaccine only if they had not previously received it. The [employer]nec[employer] and the [employer] support this recommendation.  You should get the whooping cough vaccine while pregnant to pass protection to your baby frame support disabled and/or not supported in this browser  Learn why Teresa [name] decided to get the whooping cough vaccine in her 3rd trimester of pregnancy and how her baby girl was born with some protection against the disease. Also available on YouTube. After receiving the whooping cough vaccine, your body will create protective antibodies (proteins produced by the body to fight off diseases) and pass some of them to your baby before birth. These antibodies provide your baby some short-term  protection against whooping cough in early life. These antibodies can also protect your baby from some of the more serious complications that come along with whooping cough. Your protective antibodies are at their highest about 2 weeks after getting the vaccine, but it takes time to pass them to your baby. So the preferred time to get the whooping cough vaccine is early in your third trimester. The amount of whooping cough antibodies in your body decreases over time. That is why CDC recommends you get a whooping cough vaccine during each pregnancy. Doing so allows each of your babies to get the greatest number of protective antibodies from you. This means each of your babies will get the best protection possible against this disease.  Getting the whooping cough vaccine while pregnant is better than getting the vaccine after you give birth Whooping cough vaccination during pregnancy is ideal so your baby will have short-term protection as soon as he is born. This early protection is important because your baby will not start getting his whooping cough vaccines until he is 2 months old. These first few months of life are when your baby is at greatest risk for catching whooping cough. This is also when he's at greatest risk for having severe, potentially life-threating complications from the infection. To avoid that gap in protection, it is best to get a whooping cough vaccine during pregnancy. You will then pass protection to your baby before he is born. To continue protecting your baby, he should get whooping cough vaccines starting at 2 months old. You may never have gotten the Tdap vaccine before and did not get it during this pregnancy. If so, you should  make sure to get the vaccine immediately after you give birth, before leaving the hospital or birthing center. It will take about 2 weeks before your body develops protection (antibodies) in response to the vaccine. Once you have protection from the vaccine,  you are less likely to give whooping cough to your newborn while caring for him. But remember, your baby will still be at risk for catching whooping cough from others. A recent study looked to see how effective Tdap was at preventing whooping cough in babies whose mothers got the vaccine while pregnant or in the hospital after giving birth. The study found that getting Tdap between 27 through 36 weeks of pregnancy is 85% more effective at preventing whooping cough in babies younger than 2 months old. Blood tests cannot tell if you need a whooping cough vaccine There are no blood tests that can tell you if you have enough antibodies in your body to protect yourself or your baby against whooping cough. Even if you have been sick with whooping cough in the past or previously received the vaccine, you still should get the vaccine during each pregnancy. Breastfeeding may pass some protective antibodies onto your baby By breastfeeding, you may pass some antibodies you have made in response to the vaccine to your baby. When you get a whooping cough vaccine during your pregnancy, you will have antibodies in your breast milk that you can share with your baby as soon as your milk comes in. However, your baby will not get protective antibodies immediately if you wait to get the whooping cough vaccine until after delivering your baby. This is because it takes about 2 weeks for your body to create antibodies. Learn more about the health benefits of breastfeeding.   RSV Vaccination for Pregnant People  CDC recommends two ways to protect babies from getting very sick with Respiratory Syncytial Virus (RSV):  An RSV vaccination given during pregnancy  Pfizer's vaccine [name]) is recommended for use during pregnancy. It is given during RSV season to people who are 32 through [redacted] weeks pregnant.  Or, An RSV immunization given directly to infants and some older babies  Babies born to mothers who get RSV vaccine at  least 2 weeks before delivery will have protection and, in most cases, should not need an RSV immunization later.    When is RSV season?  In most regions of the [place] RSV season starts in the fall and peaks in the winter, but the timing and severity of RSV season can vary from place to place and year to year.   The goal of maternal RSV vaccination is to protect babies from getting very sick with RSV during their first RSV season.  In most of the [place], this means maternal RSV vaccine will be given in September through January.  Who should get the maternal RSV vaccine?  People who are 53 through [redacted] weeks pregnant during September through January should get one dose of maternal RSV vaccine to protect their babies. RSV season can vary around the country.   How is the maternal RSV vaccine administered?  Maternal RSV vaccine is given as a shot into the mother's upper arm. Only a single dose (one shot) of maternal RSV vaccine is recommended.   It is not yet known whether another dose might be needed in later pregnancies.  How well does the maternal RSV vaccine work?  When someone gets RSV vaccine, their body responds by making a protein that protects against  the virus that causes RSV. The process takes about 2 weeks. When a pregnant person gets RSV vaccine, their protective proteins (called antibodies) also pass to their baby. So, babies who are born at least 2 weeks after their mother gets RSV vaccine are protected at birth, when infants are at the highest risk of severe RSV disease.   The vaccine can reduce a baby's risk of being hospitalized from RSV by 57% in the first six months after birth.  What are the possible side effects of the maternal RSV vaccine?  In the clinical trials, the side effects most often reported by pregnant people who received the maternal RSV vaccine were pain at the injection site, headache, muscle pain, and nausea.  Although not  common, a dangerous high blood pressure condition called pre-eclampsia occurred in 1.8% of pregnant people who received the maternal RSV vaccine compared to 1.4% of pregnant people who received a placebo.  The clinical trials identified a small increase in the number of preterm births in vaccinated pregnant people. It is not clear if this is a true safety problem related to RSV vaccine or if this occurred for reasons unrelated to vaccination.  To reduce the potential risk of preterm birth and complications from RSV disease, FDA approved the maternal RSV vaccine for use during weeks 32 through 13 of pregnancy while additional studies are conducted.  FDA is requiring the manufacturer to do additional studies that will look more closely at the potential risk of preterm births and pregnancy-related high blood pressure issues in mothers, including pre-eclampsia.  Severe allergic reactions to vaccines are rare but can happen after any vaccine and can be life-threatening. If you see signs of a severe allergic reaction after vaccination (hives, swelling of the face and throat, difficulty breathing, a fast heartbeat, dizziness, or weakness), seek immediate medical care by calling 911.  As with any medicine or vaccine there is a very remote chance of the vaccine causing other serious injury or death after vaccination.  Adverse events following vaccination should be reported to the Vaccine Adverse Event Reporting System (VAERS), even if it's not clear that the vaccine caused the adverse event. You or your doctor can report an adverse event to [facility] and FDA through VAERS. If you need further assistance reporting to VAERS, please email [email or web address] or call [phone].  If you have any questions about side effects from the maternal RSV vaccine, talk with your healthcare provider.  Do I need a prescription for a maternal RSV vaccine?  Until the vaccine available in the office, you will need a prescription to  take to a local pharmacy that is providing the vaccine.   How do I pay for the maternal RSV vaccine?  Most private health insurance plans cover the maternal RSV vaccine, but there may be a cost to you depending on your plan.  Contact your insurer to find out.  Medicaid Beginning January 27, 2022, most people with coverage from [facility] and [employer] [facility]) will be guaranteed coverage of all vaccines recommended by the Advisory Committee on Immunization Practice at no cost to them.   Source: [facility] for Immunization and Respiratory Diseases

## 2024-01-26 ENCOUNTER — Ambulatory Visit: Payer: Self-pay | Admitting: Obstetrics & Gynecology

## 2024-01-26 LAB — CERVICOVAGINAL ANCILLARY ONLY
Bacterial Vaginitis (gardnerella): NEGATIVE
Candida Glabrata: NEGATIVE
Candida Vaginitis: NEGATIVE
Chlamydia: NEGATIVE
Comment: NEGATIVE
Comment: NEGATIVE
Comment: NEGATIVE
Comment: NEGATIVE
Comment: NEGATIVE
Comment: NORMAL
Neisseria Gonorrhea: NEGATIVE
Trichomonas: NEGATIVE

## 2024-02-05 ENCOUNTER — Inpatient Hospital Stay (HOSPITAL_COMMUNITY)
Admission: AD | Admit: 2024-02-05 | Discharge: 2024-02-05 | Disposition: A | Discharge status: Home / Self Care | Attending: Obstetrics and Gynecology | Admitting: Obstetrics and Gynecology

## 2024-02-05 ENCOUNTER — Encounter (HOSPITAL_COMMUNITY): Payer: Self-pay | Admitting: Obstetrics and Gynecology

## 2024-02-05 DIAGNOSIS — O09292 Supervision of pregnancy with other poor reproductive or obstetric history, second trimester: Secondary | ICD-10-CM | POA: Diagnosis not present

## 2024-02-05 DIAGNOSIS — Z3A26 26 weeks gestation of pregnancy: Secondary | ICD-10-CM | POA: Diagnosis not present

## 2024-02-05 DIAGNOSIS — O99512 Diseases of the respiratory system complicating pregnancy, second trimester: Secondary | ICD-10-CM | POA: Insufficient documentation

## 2024-02-05 DIAGNOSIS — O26892 Other specified pregnancy related conditions, second trimester: Secondary | ICD-10-CM | POA: Diagnosis not present

## 2024-02-05 DIAGNOSIS — O2312 Infections of bladder in pregnancy, second trimester: Secondary | ICD-10-CM | POA: Diagnosis not present

## 2024-02-05 DIAGNOSIS — O98812 Other maternal infectious and parasitic diseases complicating pregnancy, second trimester: Secondary | ICD-10-CM | POA: Insufficient documentation

## 2024-02-05 DIAGNOSIS — N3020 Other chronic cystitis without hematuria: Secondary | ICD-10-CM | POA: Insufficient documentation

## 2024-02-05 DIAGNOSIS — O99282 Endocrine, nutritional and metabolic diseases complicating pregnancy, second trimester: Secondary | ICD-10-CM | POA: Insufficient documentation

## 2024-02-05 DIAGNOSIS — B3731 Acute candidiasis of vulva and vagina: Secondary | ICD-10-CM | POA: Diagnosis not present

## 2024-02-05 DIAGNOSIS — M533 Sacrococcygeal disorders, not elsewhere classified: Secondary | ICD-10-CM

## 2024-02-05 DIAGNOSIS — Z9181 History of falling: Secondary | ICD-10-CM | POA: Diagnosis not present

## 2024-02-05 DIAGNOSIS — Z87891 Personal history of nicotine dependence: Secondary | ICD-10-CM | POA: Diagnosis not present

## 2024-02-05 DIAGNOSIS — M898X9 Other specified disorders of bone, unspecified site: Secondary | ICD-10-CM | POA: Diagnosis not present

## 2024-02-05 DIAGNOSIS — M549 Dorsalgia, unspecified: Secondary | ICD-10-CM | POA: Diagnosis present

## 2024-02-05 DIAGNOSIS — O23592 Infection of other part of genital tract in pregnancy, second trimester: Secondary | ICD-10-CM | POA: Insufficient documentation

## 2024-02-05 HISTORY — DX: Syphilis, unspecified: A53.9

## 2024-02-05 LAB — URINALYSIS, ROUTINE W REFLEX MICROSCOPIC
Bilirubin Urine: NEGATIVE
Glucose, UA: 50 mg/dL — AB
Hgb urine dipstick: NEGATIVE
Ketones, ur: NEGATIVE mg/dL
Leukocytes,Ua: NEGATIVE
Nitrite: NEGATIVE
Protein, ur: 30 mg/dL — AB
Specific Gravity, Urine: 1.028 (ref 1.005–1.030)
pH: 5 (ref 5.0–8.0)

## 2024-02-05 LAB — WET PREP, GENITAL
Clue Cells Wet Prep HPF POC: NONE SEEN
Sperm: NONE SEEN
Trich, Wet Prep: NONE SEEN
WBC, Wet Prep HPF POC: <10 (ref <10)
Yeast Wet Prep HPF POC: NONE SEEN

## 2024-02-05 MED ORDER — CYCLOBENZAPRINE HCL 10 MG PO TABS: 10.0000 mg | ORAL_TABLET | Freq: Two times a day (BID) | ORAL | 0 refills | Status: DC | PRN

## 2024-02-05 MED ORDER — FLUCONAZOLE 150 MG PO TABS
150.0000 mg | ORAL_TABLET | Freq: Every day | ORAL | 0 refills | Status: DC
Start: 2024-02-05 — End: 2024-03-09

## 2024-02-05 NOTE — MAU Note (Addendum)
.  Teresa [name] is a 33 y.o. at [redacted]w[redacted]d here in MAU reporting: Vaginal pain that pt describes as aching and rates a 8/10 in pain, worse upon walking and relieved by nothing. Pt also has lower back pain. Pt has had cramping for 2 days, denies VB and LOF. Reports +FM. Pt has not taken anything for pain and denies recent sexual intercourse but feels she may have a vaginal infection. Pt is currently taking Procardia  for HTN but says she did not take it this morning due to attending funeral, denies s/s of high BP. C/o left shoulder pain that she thinks is from lifting her child. Denies jaw pain or chest pain; c/o SOB and thinks an inhaler may help. Reports a fall in shower 3 weeks ago, pt was evaluated for it when the fall occurred but states her entire body is so sore and c/o pain in her buttocks.  LMP: na Onset of complaint: 1130 Pain score: 8/10 -Vagina There were no vitals filed for this visit.   FHT: 147  Lab orders placed from triage: ua

## 2024-02-05 NOTE — Discharge Instructions (Signed)

## 2024-02-05 NOTE — MAU Provider Note (Signed)
 History     CSN: [id number]  Arrival date and time: 02/05/24 1725   Event Date/Time   First Provider Initiated Contact with Patient 02/05/24 1819      Chief Complaint  Patient presents with   Back Pain   Pelvic Pain   HPI  Teresa [name] is a 33 y.o. [id number] at [redacted]w[redacted]d who presents for evaluation of vaginal irritation. Patient reports she scrubbed with a new rag and soap and feels like it caused irritation to her vagina. She denies any abdominal pain, vaginal bleeding or leaking of fluid. She reports she is still sore from her fall a few weeks ago. Denies any constipation, diarrhea or any urinary complaints. Reports normal fetal movement.   OB History     Gravida  4   Para  2   Term  2   Preterm  0   AB  1   Living  2      SAB  0   IAB  1   Ectopic  0   Multiple  1   Live Births  2           Past Medical History:  Diagnosis Date   Abnormal Pap smear    Acid reflux    ADHD (attention deficit hyperactivity disorder)    Asthma    childhood /grew out of it   Breast mass, right 01/11/2014   Negative breast imaging. Radiologist at [facility] states cannot see or feel mass.      Chlamydia    Chronic cystitis 07/02/2018   Degenerative disk disease    Depression    post partem   Dysmenorrhea 04/15/2012   Fractured fibula    left   History of PCOS    Migraine    Syphilis    2023   Trichomonas    Urinary tract infection    Vaginal Pap smear, abnormal    normal since    Past Surgical History:  Procedure Laterality Date   CESAREAN SECTION     DILATION AND CURETTAGE OF UTERUS     INDUCED ABORTION     TONSILLECTOMY AND ADENOIDECTOMY N/A 08/02/2020   Procedure: TONSILLECTOMY AND ADENOIDECTOMY;  Surgeon: Karis Clunes, MD;  Location: [facility] OR;  Service: ENT;  Laterality: N/A;   VAGINAL DELIVERY  2013   WISDOM TOOTH EXTRACTION      Family History  Problem Relation Age of Onset   Diabetes Mother    Hypertension Mother    Heart disease Mother     Diabetes Maternal Grandfather    Heart disease Maternal Grandmother        chf   Anesthesia problems Neg Hx    Hypotension Neg Hx    Malignant hyperthermia Neg Hx    Pseudochol deficiency Neg Hx     Social History   Tobacco Use   Smoking status: Former    Current packs/day: 0.50    Average packs/day: 0.5 packs/day for 7.0 years (3.5 ttl pk-yrs)    Types: Cigarettes   Smokeless tobacco: Never  Vaping Use   Vaping status: Never Used  Substance Use Topics   Alcohol use: Not Currently    Alcohol/week: 1.0 standard drink of alcohol    Types: 1 Shots of liquor per week    Comment: occ but was a heavy user in 2015 due to boyfriend passed away.   Drug use: Not Currently    Types: Marijuana    Comment: last 10/15/23    Allergies:  Allergies  Allergen Reactions   Other Anaphylaxis    Grapes, strawberries, bananas, pineapple, oranges, nuts   Peanut (Diagnostic) Anaphylaxis   Pineapple Anaphylaxis and Swelling   Betadine [Povidone Iodine] Itching    Betadine causes itching and skin redness and irritation   Povidone-Iodine Itching, Rash and Dermatitis    Betadine causes itching and skin redness and irritation   Pollen Extract Itching   Sulfa  Antibiotics Itching   Bee Pollen Itching   Iodine Itching   Latex Itching   Tomato Itching and Rash   Tramadol  Nausea And Vomiting and Nausea Only    Medications Prior to Admission  Medication Sig Dispense Refill Last Dose/Taking   acetaminophen  (TYLENOL ) 500 MG tablet Take 2 tablets (1,000 mg total) by mouth every 6 (six) hours as needed.   Past Month   aspirin  EC 81 MG tablet Take 2 tablets (162 mg total) by mouth daily. Start taking when you are [redacted] weeks pregnant for rest of pregnancy for prevention of preeclampsia 300 tablet 2 02/04/2024   cyclobenzaprine  (FLEXERIL ) 5 MG tablet Take 1 tablet (5 mg total) by mouth 3 (three) times daily as needed for muscle spasms. 10 tablet 0 02/05/2024 at  3:00 AM   NIFEdipine  (PROCARDIA -XL/NIFEDICAL-XL)  30 MG 24 hr tablet Take 1 tablet (30 mg total) by mouth daily. 30 tablet 5 02/04/2024   Prenatal Vit-Fe Fumarate-FA (PRENATAL COMPLETE) 14-0.4 MG TABS Take 1 tablet by mouth daily. 60 tablet 6 02/04/2024    Review of Systems  Constitutional: Negative.  Negative for fatigue and fever.  HENT: Negative.    Respiratory: Negative.  Negative for shortness of breath.   Cardiovascular: Negative.  Negative for chest pain.  Gastrointestinal: Negative.  Negative for abdominal pain, constipation, diarrhea, nausea and vomiting.  Genitourinary:  Positive for vaginal pain. Negative for dysuria.  Neurological: Negative.  Negative for dizziness and headaches.   Physical Exam   Blood pressure 132/63, pulse (!) 108, temperature 98.2 F (36.8 C), resp. rate 18, height 5' 2 (1.575 m), weight (!) 154.2 kg, last menstrual period 07/17/2023, SpO2 98%, unknown if currently breastfeeding.  Patient Vitals for the past 24 hrs:  BP Temp Pulse Resp SpO2 Height Weight  02/05/24 1830 132/63 -- (!) 108 -- -- -- --  02/05/24 1820 (!) 140/75 -- (!) 106 -- -- -- --  02/05/24 1819 -- 98.2 F (36.8 C) -- 18 98 % -- --  02/05/24 1744 -- -- -- -- -- 5' 2 (1.575 m) (!) 154.2 kg    Physical Exam Vitals and nursing note reviewed.  Constitutional:      General: She is not in acute distress.    Appearance: She is well-developed.  HENT:     Head: Normocephalic.  Eyes:     Pupils: Pupils are equal, round, and reactive to light.  Cardiovascular:     Rate and Rhythm: Normal rate and regular rhythm.     Heart sounds: Normal heart sounds.  Pulmonary:     Effort: Pulmonary effort is normal. No respiratory distress.     Breath sounds: Normal breath sounds.  Abdominal:     General: Bowel sounds are normal. There is no distension.     Palpations: Abdomen is soft.     Tenderness: There is no abdominal tenderness.  Genitourinary:    Labia:        Right: Rash and tenderness present.        Left: Rash and tenderness present.    Skin:    General: Skin is warm  and dry.  Neurological:     Mental Status: She is alert and oriented to [place], place, and time.  Psychiatric:        Mood and Affect: Mood normal.        Behavior: Behavior normal.        Thought Content: Thought content normal.        Judgment: Judgment normal.     Fetal Tracing:  Baseline: 140 Variability: moderate Accels: 10x10 Decels: none  Toco: none      MAU Course  Procedures  Results for orders placed or performed during the hospital encounter of 02/05/24 (from the past 24 hours)  Urinalysis, Routine w reflex microscopic -Urine, Clean Catch     Status: Abnormal   Collection Time: 02/05/24  6:37 PM  Result Value Ref Range   Color, Urine YELLOW YELLOW   APPearance HAZY (A) CLEAR   Specific Gravity, Urine 1.028 1.005 - 1.030   pH 5.0 5.0 - 8.0   Glucose, UA 50 (A) NEGATIVE mg/dL   Hgb urine dipstick NEGATIVE NEGATIVE   Bilirubin Urine NEGATIVE NEGATIVE   Ketones, ur NEGATIVE NEGATIVE mg/dL   Protein, ur 30 (A) NEGATIVE mg/dL   Nitrite NEGATIVE NEGATIVE   Leukocytes,Ua NEGATIVE NEGATIVE   RBC / HPF 0-5 0 - 5 RBC/hpf   WBC, UA 0-5 0 - 5 WBC/hpf   Bacteria, UA RARE (A) NONE SEEN   Squamous Epithelial / HPF 11-20 0 - 5 /HPF   Mucus PRESENT   Wet prep, genital     Status: None   Collection Time: 02/05/24  6:49 PM  Result Value Ref Range   Yeast Wet Prep HPF POC NONE SEEN NONE SEEN   Trich, Wet Prep NONE SEEN NONE SEEN   Clue Cells Wet Prep HPF POC NONE SEEN NONE SEEN   WBC, Wet Prep HPF POC <10 <10   Sperm NONE SEEN      No results found.   MDM  Labs ordered and reviewed.   UA Wet prep and gc/chlamydia NST reassuring for gestational age  Exam consistent with yeast. Will treat based on exam. Patient desires diflucan  for ease of use.  Assessment and Plan   1. Vaginal yeast infection   2. [redacted] weeks gestation of pregnancy   3. Tail bone pain     -Discharge home in stable condition -Rx for diflucan  and flexeril   sent to pharmacy -Second trimester precautions discussed -Patient advised to follow-up with OB as scheduled for prenatal care -Patient may return to MAU as needed or if her condition were to change or worsen  Aleck CHRISTELLA Fireman, CNM 02/05/2024, 6:19 PM

## 2024-02-06 LAB — GC/CHLAMYDIA PROBE AMP (~~LOC~~) NOT AT ARMC
Chlamydia: NEGATIVE
Comment: NEGATIVE
Comment: NORMAL
Neisseria Gonorrhea: NEGATIVE

## 2024-02-17 ENCOUNTER — Other Ambulatory Visit: Payer: Self-pay

## 2024-02-17 ENCOUNTER — Other Ambulatory Visit (HOSPITAL_COMMUNITY): Payer: Self-pay

## 2024-02-17 ENCOUNTER — Inpatient Hospital Stay (HOSPITAL_COMMUNITY)
Admission: AD | Admit: 2024-02-17 | Discharge: 2024-02-17 | Disposition: A | Discharge status: Home / Self Care | Payer: Self-pay | Attending: Obstetrics & Gynecology | Admitting: Obstetrics & Gynecology

## 2024-02-17 ENCOUNTER — Encounter (HOSPITAL_COMMUNITY): Payer: Self-pay | Admitting: Obstetrics & Gynecology

## 2024-02-17 DIAGNOSIS — O26893 Other specified pregnancy related conditions, third trimester: Secondary | ICD-10-CM | POA: Diagnosis not present

## 2024-02-17 DIAGNOSIS — Z8616 Personal history of COVID-19: Secondary | ICD-10-CM | POA: Diagnosis not present

## 2024-02-17 DIAGNOSIS — Z3689 Encounter for other specified antenatal screening: Secondary | ICD-10-CM | POA: Insufficient documentation

## 2024-02-17 DIAGNOSIS — T465X6A Underdosing of other antihypertensive drugs, initial encounter: Secondary | ICD-10-CM | POA: Insufficient documentation

## 2024-02-17 DIAGNOSIS — R0602 Shortness of breath: Secondary | ICD-10-CM | POA: Insufficient documentation

## 2024-02-17 DIAGNOSIS — R030 Elevated blood-pressure reading, without diagnosis of hypertension: Secondary | ICD-10-CM | POA: Diagnosis present

## 2024-02-17 DIAGNOSIS — R519 Headache, unspecified: Secondary | ICD-10-CM

## 2024-02-17 DIAGNOSIS — O10013 Pre-existing essential hypertension complicating pregnancy, third trimester: Secondary | ICD-10-CM | POA: Insufficient documentation

## 2024-02-17 DIAGNOSIS — Z7982 Long term (current) use of aspirin: Secondary | ICD-10-CM | POA: Insufficient documentation

## 2024-02-17 DIAGNOSIS — Z3A28 28 weeks gestation of pregnancy: Secondary | ICD-10-CM

## 2024-02-17 DIAGNOSIS — Z79899 Other long term (current) drug therapy: Secondary | ICD-10-CM | POA: Insufficient documentation

## 2024-02-17 DIAGNOSIS — O10919 Unspecified pre-existing hypertension complicating pregnancy, unspecified trimester: Secondary | ICD-10-CM

## 2024-02-17 DIAGNOSIS — Z91138 Patient's unintentional underdosing of medication regimen for other reason: Secondary | ICD-10-CM | POA: Insufficient documentation

## 2024-02-17 DIAGNOSIS — O4703 False labor before 37 completed weeks of gestation, third trimester: Secondary | ICD-10-CM | POA: Diagnosis present

## 2024-02-17 LAB — CBC
HCT: 34.8 % — ABNORMAL LOW (ref 36.0–46.0)
Hemoglobin: 11.4 g/dL — ABNORMAL LOW (ref 12.0–15.0)
MCH: 29.6 pg (ref 26.0–34.0)
MCHC: 32.8 g/dL (ref 30.0–36.0)
MCV: 90.4 fL (ref 80.0–100.0)
Platelets: 313 K/uL (ref 150–400)
RBC: 3.85 MIL/uL — ABNORMAL LOW (ref 3.87–5.11)
RDW: 13.3 % (ref 11.5–15.5)
WBC: 8.8 K/uL (ref 4.0–10.5)
nRBC: 0 % (ref 0.0–0.2)

## 2024-02-17 LAB — URINALYSIS, ROUTINE W REFLEX MICROSCOPIC
Bilirubin Urine: NEGATIVE
Glucose, UA: NEGATIVE mg/dL
Hgb urine dipstick: NEGATIVE
Ketones, ur: NEGATIVE mg/dL
Leukocytes,Ua: NEGATIVE
Nitrite: NEGATIVE
Protein, ur: 30 mg/dL — AB
Specific Gravity, Urine: 1.017 (ref 1.005–1.030)
pH: 6 (ref 5.0–8.0)

## 2024-02-17 LAB — PROTEIN / CREATININE RATIO, URINE
Creatinine, Urine: 126 mg/dL
Protein Creatinine Ratio: 0.34 mg/mg{creat} — ABNORMAL HIGH (ref 0.00–0.15)
Total Protein, Urine: 43 mg/dL

## 2024-02-17 LAB — COMPREHENSIVE METABOLIC PANEL WITH GFR
ALT: 12 U/L (ref 0–44)
AST: 19 U/L (ref 15–41)
Albumin: 2.7 g/dL — ABNORMAL LOW (ref 3.5–5.0)
Alkaline Phosphatase: 58 U/L (ref 38–126)
Anion gap: 9 (ref 5–15)
BUN: <5 mg/dL — ABNORMAL LOW (ref 6–20)
CO2: 22 mmol/L (ref 22–32)
Calcium: 9.1 mg/dL (ref 8.9–10.3)
Chloride: 103 mmol/L (ref 98–111)
Creatinine, Ser: 0.53 mg/dL (ref 0.44–1.00)
GFR, Estimated: >60 mL/min (ref >60)
Glucose, Bld: 185 mg/dL — ABNORMAL HIGH (ref 70–99)
Potassium: 3.9 mmol/L (ref 3.5–5.1)
Sodium: 134 mmol/L — ABNORMAL LOW (ref 135–145)
Total Bilirubin: 0.3 mg/dL (ref 0.0–1.2)
Total Protein: 6.6 g/dL (ref 6.5–8.1)

## 2024-02-17 MED ORDER — DIPHENHYDRAMINE HCL 50 MG/ML IJ SOLN
12.5000 mg | Freq: Once | INTRAMUSCULAR | Status: AC
  Administered 2024-02-17: 12.5 mg via INTRAVENOUS
  Filled 2024-02-17: qty 1

## 2024-02-17 MED ORDER — ACETAMINOPHEN-CAFFEINE 500-65 MG PO TABS
2.0000 | ORAL_TABLET | Freq: Once | ORAL | Status: AC
  Administered 2024-02-17: 2 via ORAL
  Filled 2024-02-17: qty 2

## 2024-02-17 MED ORDER — NIFEDIPINE ER OSMOTIC RELEASE 30 MG PO TB24
30.0000 mg | ORAL_TABLET | Freq: Every day | ORAL | Status: DC
  Administered 2024-02-17: 30 mg via ORAL
  Filled 2024-02-17: qty 1

## 2024-02-17 MED ORDER — NIFEDIPINE ER 30 MG PO TB24
30.0000 mg | ORAL_TABLET | Freq: Every day | ORAL | 2 refills | Status: DC
  Filled 2024-02-17: qty 90, 90d supply, fill #0

## 2024-02-17 MED ORDER — NIFEDIPINE 10 MG PO CAPS
10.0000 mg | ORAL_CAPSULE | Freq: Once | ORAL | Status: AC
Start: 2024-02-17 — End: 2024-02-17
  Administered 2024-02-17: 10 mg via ORAL
  Filled 2024-02-17: qty 1

## 2024-02-17 MED ORDER — LACTATED RINGERS IV BOLUS
1000.0000 mL | Freq: Once | INTRAVENOUS | Status: AC
  Administered 2024-02-17: 1000 mL via INTRAVENOUS

## 2024-02-17 MED ORDER — METOCLOPRAMIDE HCL 5 MG/ML IJ SOLN
10.0000 mg | Freq: Once | INTRAMUSCULAR | Status: AC
  Administered 2024-02-17: 10 mg via INTRAVENOUS
  Filled 2024-02-17: qty 2

## 2024-02-17 MED ORDER — DEXAMETHASONE SOD PHOSPHATE PF 10 MG/ML IJ SOLN
10.0000 mg | Freq: Once | INTRAMUSCULAR | Status: AC
  Administered 2024-02-17: 10 mg via INTRAVENOUS

## 2024-02-17 NOTE — Discharge Instructions (Addendum)
 General instructions Take your medicines only as told. Keep all follow-up visits. Your provider will check your blood pressure and make sure that you and your baby are healthy. Contact a health care provider if: Your baby is not moving as much as usual. You feel very tired. You feel faint or dizzy. You throw up, or feel like you may throw up. You have cramping in your belly or have pain in your hips or lower back. You have spotting or bleeding, or you leak fluid from your vagina. Get help right away if: You have chest pain or trouble breathing. You faint, have a seizure, or cannot think clearly. You have symptoms of serious problems, such as: A headache that doesn't go away when you take medicine. Very bad and sudden swelling of your face, hands, legs, or feet. Vision problems, such as: Seeing spots. Blurry vision. Being sensitive to light. These symptoms may be an emergency. Call 911 right away. Do not wait to see if the symptoms will go away. Do not drive yourself to the hospital.

## 2024-02-17 NOTE — MAU Provider Note (Addendum)
 History   CSN: [id number]  Arrival date and time: 02/17/24 9071  Chief Complaint  Patient presents with   Headache   out of BP meds   Teresa [name] is a 33 yo F G4P2012 [redacted]w[redacted]d who presents to the MAU with primary complaints of headache and elevated BP. Patient states she ran out of her Procardia  30mg  2 days ago and has been to 2 different pharmacies which didn't have her medication and so was unable to get it refilled. Home BP with electronic BP cuff this AM 140s/100s. She states she has a parietal headache she rates 8/10 with possibly some spots or floaters in her vision earlier but none at present. She reports bilateral LE edema without pain or color changes and intermittent Braxton-Hicks contractions for at least 1 month. She has mild SOB at baseline after COVID and secondary to body habitus. She denies any CP, SOB more than normal, LE pain, slurred speech, weakness/numbness/tingling, abd pain, vaginal bleeding or discharge, gait abnormality, recent fall or head trauma, fever, cough, congestion. She currently takes 30mg  Procardia , aspirin  81mg , and a prenatal vitamin.  Denies any vaginal bleeding or LOF.  Pertinent Gynecological History: Menses: usually lasting less than 6 days and heavy  Bleeding: none Contraception: none DES exposure: denies Blood transfusions: none Sexually transmitted diseases: past history: trichomonas, chlamydia, syphilis  Previous GYN Procedures: 1 c-section  Last pap: normal Date: 2024  Past Medical History:  Diagnosis Date   Abnormal Pap smear    Acid reflux    ADHD (attention deficit hyperactivity disorder)    Asthma    childhood /grew out of it   Breast mass, right 01/11/2014   Negative breast imaging. Radiologist at [facility] states cannot see or feel mass.      Chlamydia    Chronic cystitis 07/02/2018   Degenerative disk disease    Depression    post partem   Dysmenorrhea 04/15/2012   Fractured fibula    left   History of PCOS    Migraine     Syphilis    2023   Trichomonas    Urinary tract infection    Vaginal Pap smear, abnormal    normal since   Past Surgical History:  Procedure Laterality Date   CESAREAN SECTION     DILATION AND CURETTAGE OF UTERUS     INDUCED ABORTION     TONSILLECTOMY AND ADENOIDECTOMY N/A 08/02/2020   Procedure: TONSILLECTOMY AND ADENOIDECTOMY;  Surgeon: Karis Clunes, MD;  Location: [facility] OR;  Service: ENT;  Laterality: N/A;   VAGINAL DELIVERY  2013   WISDOM TOOTH EXTRACTION     Family History  Problem Relation Age of Onset   Diabetes Mother    Hypertension Mother    Heart disease Mother    Diabetes Maternal Grandfather    Heart disease Maternal Grandmother        chf   Anesthesia problems Neg Hx    Hypotension Neg Hx    Malignant hyperthermia Neg Hx    Pseudochol deficiency Neg Hx    Social History   Tobacco Use   Smoking status: Former    Current packs/day: 0.50    Average packs/day: 0.5 packs/day for 7.0 years (3.5 ttl pk-yrs)    Types: Cigarettes   Smokeless tobacco: Never  Vaping Use   Vaping status: Never Used  Substance Use Topics   Alcohol use: Not Currently    Alcohol/week: 1.0 standard drink of alcohol    Types: 1 Shots of liquor per week  Comment: occ but was a heavy user in 2015 due to boyfriend passed away.   Drug use: Not Currently    Types: Marijuana    Comment: last 10/15/23   Allergies:  Allergies  Allergen Reactions   Other Anaphylaxis    Grapes, strawberries, bananas, pineapple, oranges, nuts   Peanut (Diagnostic) Anaphylaxis   Pineapple Anaphylaxis and Swelling   Betadine [Povidone Iodine] Itching    Betadine causes itching and skin redness and irritation   Povidone-Iodine Itching, Rash and Dermatitis    Betadine causes itching and skin redness and irritation   Pollen Extract Itching   Sulfa  Antibiotics Itching   Bee Pollen Itching   Iodine Itching   Latex Itching   Tomato Itching and Rash   Tramadol  Nausea And Vomiting and Nausea Only   Medications  Prior to Admission  Medication Sig Dispense Refill Last Dose/Taking   acetaminophen  (TYLENOL ) 500 MG tablet Take 2 tablets (1,000 mg total) by mouth every 6 (six) hours as needed.   Past Month   aspirin  EC 81 MG tablet Take 2 tablets (162 mg total) by mouth daily. Start taking when you are [redacted] weeks pregnant for rest of pregnancy for prevention of preeclampsia 300 tablet 2 Past Week   cyclobenzaprine  (FLEXERIL ) 10 MG tablet Take 1 tablet (10 mg total) by mouth 2 (two) times daily as needed for muscle spasms. 20 tablet 0 Past Week   fluconazole  (DIFLUCAN ) 150 MG tablet Take 1 tablet (150 mg total) by mouth daily. 2 tablet 0 Past Month   NIFEdipine  (PROCARDIA -XL/NIFEDICAL-XL) 30 MG 24 hr tablet Take 1 tablet (30 mg total) by mouth daily. 30 tablet 5 Past Week   Prenatal Vit-Fe Fumarate-FA (PRENATAL COMPLETE) 14-0.4 MG TABS Take 1 tablet by mouth daily. 60 tablet 6 02/16/2024   Review of Systems  Constitutional: Negative.   HENT: Negative.    Eyes: Negative.   Respiratory: Negative.    Cardiovascular: Negative.   Gastrointestinal: Negative.   Genitourinary: Negative.   Musculoskeletal: Negative.   Skin: Negative.   Neurological:  Positive for headaches. Negative for dizziness, tremors, seizures, weakness, light-headedness and numbness.   Physical Exam   Patient Vitals for the past 24 hrs:  BP Pulse SpO2 Height Weight  02/17/24 1512 (!) 148/82 (!) 108 -- -- --  02/17/24 1435 (!) 143/76 (!) 106 -- -- --  02/17/24 1400 (!) 148/71 96 -- -- --  02/17/24 1330 (!) 154/79 97 -- -- --  02/17/24 1300 138/78 95 -- -- --  02/17/24 1240 123/64 (!) 103 -- -- --  02/17/24 1200 (!) 140/89 100 -- -- --  02/17/24 1130 (!) 146/81 97 -- -- --  02/17/24 1110 (!) 145/80 -- -- -- --  02/17/24 1109 -- 100 -- -- --  02/17/24 1030 132/87 (!) 167 -- -- --  02/17/24 1015 (!) 162/113 (!) 112 -- -- --  02/17/24 0956 -- -- 99 % -- --  02/17/24 0954 138/79 (!) 114 -- -- --  02/17/24 0949 -- -- -- 5' 2 (1.575 m)  (!) 362 lb (164.2 kg)   Physical Exam Constitutional:      General: She is not in acute distress.    Appearance: She is well-developed. She is obese. She is not toxic-appearing.  HENT:     Head: Normocephalic and atraumatic.     Mouth/Throat:     Mouth: Mucous membranes are moist.     Pharynx: Oropharynx is clear.  Eyes:     General: No scleral icterus.  Extraocular Movements: Extraocular movements intact.     Right eye: Normal extraocular motion and no nystagmus.     Left eye: Normal extraocular motion and no nystagmus.     Pupils: Pupils are equal, round, and reactive to light. Pupils are equal.  Cardiovascular:     Rate and Rhythm: Normal rate and regular rhythm.     Heart sounds: Normal heart sounds. No murmur heard. Pulmonary:     Effort: Pulmonary effort is normal. No respiratory distress.     Breath sounds: Normal breath sounds. No wheezing, rhonchi or rales.  Chest:     Chest wall: No tenderness.  Abdominal:     General: Bowel sounds are normal. There is no distension.     Palpations: Abdomen is soft. There is no mass.     Tenderness: There is no abdominal tenderness. There is no guarding.  Musculoskeletal:        General: Normal range of motion.     Cervical back: Normal range of motion and neck supple. No rigidity.     Comments: 1+ pitting edema to b/l LE  Skin:    General: Skin is warm and dry.     Capillary Refill: Capillary refill takes less than 2 seconds.  Neurological:     Mental Status: She is alert and oriented to Rajagopalan, place, and time. Mental status is at baseline.     GCS: GCS eye subscore is 4. GCS verbal subscore is 5. GCS motor subscore is 6.     Cranial Nerves: No cranial nerve deficit, dysarthria or facial asymmetry.     Sensory: No sensory deficit.     Motor: No weakness.     Coordination: Coordination normal.     Gait: Gait normal.     Comments: Negative for clonus bilaterally, CN II-XII intact, normal gait, normal DTRs  Psychiatric:         Mood and Affect: Mood normal. Mood is not anxious or depressed.        Speech: Speech normal.        Behavior: Behavior normal.        Cognition and Memory: Cognition is not impaired.    MAU Course  MDM: Moderate  MAU Course:  BP on arrival 138/79. Patient well appearing and in good spirits. States HA 8/10. No vision changes. Subsequent BP elevated after interview most likely due to talking and laughing. Due to initial BP WNL, will begin with home BP medication and HA treatment. - Procardia  30mg   - Excedrin tension - PreE labs:  - CBC: unremarkable  - CMP: unremarkable  - UA: + 30 protein  - Protein/cr ratio - 0.34 most likely due to no BP meds for the last 2 days On recheck at 1220, patient states above interventions did not relieve HA. Will treat with one-time HA cocktail: - IV Decadron  10mg  - IV Benadryl  12.6mg  - IV Reglan  10mg  - 1L LR bolus  Patient states she feels much better, she is eating a sub sandwich on discharge. States her headache is now a 4/10. Will discharge on an immediate release Procardia  10mg  to augment earlier dose of extended release Procardia . Patient was handed TOC Procardia  30mg  prescription upon discharge and advised to continue as prescribed. Discussed borderline preeclampsia diagnosis muddied by the fact patient has not had BP medications in 48 hours. She has an appointment with her OBGYN on 10/27 and a message has been sent to the clinic to make sure she gets in with an MD and follows  up with urine prot/cr at that visit. - Immediate release Procardia  10mg  upon discharge - Continue Procardia  30mg  daily, medications given in hand to patient on d/c - Repeat urine protein/cr at 10/27 OBGYN visit  Assessment and Plan  1. Chronic hypertension affecting pregnancy (Primary) - Discharge patient  2. Pregnancy headache in third trimester  3. NST (non-stress test) reactive  4. [redacted] weeks gestation of pregnancy  Return precautions given, follow up at [address] [facility] as  scheduled for ongoing prenatal care, message sent to office to move next appt to MD schedule.  Future Appointments  Date Time Provider Department Center  02/23/2024 10:00 AM [facility]  02/23/2024 11:15 AM Eldonna Suzen Octave, MD [facility]  03/05/2024  9:15 AM [facility] PROVIDER 1 [facility]  03/05/2024  9:30 AM [facility] US1 [facility]    Allergies as of 02/17/2024       Reactions   Other Anaphylaxis   Grapes, strawberries, bananas, pineapple, oranges, nuts   Peanut (diagnostic) Anaphylaxis   Pineapple Anaphylaxis, Swelling   Betadine [povidone Iodine] Itching   Betadine causes itching and skin redness and irritation   Povidone-iodine Itching, Rash, Dermatitis   Betadine causes itching and skin redness and irritation   Pollen Extract Itching   Sulfa  Antibiotics Itching   Bee Pollen Itching   Iodine Itching   Latex Itching   Tomato Itching, Rash   Tramadol  Nausea And Vomiting, Nausea Only        Medication List     TAKE these medications    acetaminophen  500 MG tablet Commonly known as: TYLENOL  Take 2 tablets (1,000 mg total) by mouth every 6 (six) hours as needed.   aspirin  EC 81 MG tablet Take 2 tablets (162 mg total) by mouth daily. Start taking when you are [redacted] weeks pregnant for rest of pregnancy for prevention of preeclampsia   cyclobenzaprine  10 MG tablet Commonly known as: FLEXERIL  Take 1 tablet (10 mg total) by mouth 2 (two) times daily as needed for muscle spasms.   fluconazole  150 MG tablet Commonly known as: Diflucan  Take 1 tablet (150 mg total) by mouth daily.   NIFEdipine  30 MG 24 hr tablet Commonly known as: ADALAT  CC Take 1 tablet (30 mg total) by mouth daily. Start taking on: February 18, 2024   Prenatal Complete 14-0.4 MG Tabs Take 1 tablet by mouth daily.        Camie Dixons, DO 02/17/2024, 10:31 AM   Attestation of Supervision of Student:  I confirm that I have verified the information documented in the medical  student's note and that I have also personally supervised the history, physical exam and all medical decision making activities.  I have verified that all services and findings are accurately documented in this student's note; and I agree with management and plan as outlined in the documentation. I have also made any necessary editorial changes.  Cornell JONELLE Finder, CNM [facility] for [employer], [facility] 02/17/2024 7:18 PM

## 2024-02-17 NOTE — MAU Note (Addendum)
 Teresa [name] is a 33 y.o. at [redacted]w[redacted]d here in MAU reporting: HA, white visual spots, being out of BP meds by 2 day - [facility] are out and braxton hicks contractions since fall 2 months ago. NO c/o SROM, vaginal bleeding, or chest pain.  +FM   LMP: - Onset of complaint: 2 days Pain score: 8/10 Vitals:   02/17/24 0954 02/17/24 0956  BP: 138/79   Pulse: (!) 114   SpO2:  99%     FHT: done in room   Lab orders placed from triage: na

## 2024-02-19 ENCOUNTER — Other Ambulatory Visit: Payer: Self-pay | Admitting: *Deleted

## 2024-02-19 DIAGNOSIS — O0990 Supervision of high risk pregnancy, unspecified, unspecified trimester: Secondary | ICD-10-CM

## 2024-02-23 ENCOUNTER — Encounter: Payer: Self-pay | Admitting: Family Medicine

## 2024-02-23 ENCOUNTER — Ambulatory Visit: Admitting: Family Medicine

## 2024-02-23 ENCOUNTER — Other Ambulatory Visit

## 2024-02-23 VITALS — BP 134/82 | HR 118 | Wt 361.0 lb

## 2024-02-23 DIAGNOSIS — O163 Unspecified maternal hypertension, third trimester: Secondary | ICD-10-CM | POA: Diagnosis not present

## 2024-02-23 DIAGNOSIS — Z98891 History of uterine scar from previous surgery: Secondary | ICD-10-CM

## 2024-02-23 DIAGNOSIS — O0993 Supervision of high risk pregnancy, unspecified, third trimester: Secondary | ICD-10-CM | POA: Diagnosis not present

## 2024-02-23 DIAGNOSIS — Z8632 Personal history of gestational diabetes: Secondary | ICD-10-CM | POA: Diagnosis not present

## 2024-02-23 DIAGNOSIS — O0990 Supervision of high risk pregnancy, unspecified, unspecified trimester: Secondary | ICD-10-CM

## 2024-02-23 DIAGNOSIS — Z3A29 29 weeks gestation of pregnancy: Secondary | ICD-10-CM | POA: Diagnosis not present

## 2024-02-23 LAB — HEPATITIS C ANTIBODY: HCV Ab: NEGATIVE

## 2024-02-23 MED ORDER — CYCLOBENZAPRINE HCL 10 MG PO TABS: 10.0000 mg | ORAL_TABLET | Freq: Two times a day (BID) | ORAL | 0 refills | Status: DC | PRN

## 2024-02-23 NOTE — Progress Notes (Signed)
 PRENATAL VISIT NOTE  Subjective:  Teresa [name] is a 33 y.o. [id number] at [redacted]w[redacted]d being seen today for ongoing prenatal care.  She is currently monitored for the following issues for this low-risk pregnancy and has Supervision of high risk pregnancy, antepartum; Pre-existing essential hypertension affecting pregnancy in third trimester; PCOS (polycystic ovarian syndrome); History of cesarean delivery; History of gestational diabetes; Gestational diabetes, diet controlled; and Large for gestational age fetus affecting mother, antepartum, third trimester, single gestation on their problem list.  Patient reports no complaints.  Contractions: Not present. Vag. Bleeding: None.  Movement: Present. Denies leaking of fluid.   The following portions of the patient's history were reviewed and updated as appropriate: allergies, current medications, past family history, past medical history, past social history, past surgical history and problem list.   Objective:    Vitals:   02/23/24 1016  BP: 134/82  Pulse: (!) 118  Weight: (!) 361 lb (163.7 kg)    Fetal Status:  Fetal Heart Rate (bpm): 145   Movement: Present    General: Alert, oriented and cooperative. Patient is in no acute distress.  Skin: Skin is warm and dry. No rash noted.   Cardiovascular: Normal heart rate noted  Respiratory: Normal respiratory effort, no problems with respiration noted  Abdomen: Soft, gravid, appropriate for gestational age.  Pain/Pressure: Absent     Pelvic: Cervical exam deferred        Extremities: Normal range of motion.  Edema: None  Mental Status: Normal mood and affect. Normal behavior. Normal judgment and thought content.   Assessment and Plan:  Pregnancy: G4P2012 at [redacted]w[redacted]d 1. [redacted] weeks gestation of pregnancy (Primary) - Hepatitis B Surface AntiGEN - Hepatitis C Antibody - Rubella screen - Antibody screen  2. Supervision of high risk pregnancy, antepartum Up to date FH is appropriate Feeling normal  movement Having some back pain and discussed use of flexeril  - cyclobenzaprine  (FLEXERIL ) 10 MG tablet; Take 1 tablet (10 mg total) by mouth 2 (two) times daily as needed for muscle spasms.  Dispense: 20 tablet; Refill: 0  Patient adamant about wanting a lotus birth because this allows her to give the baby a bath sooner. She reports having a lotus birth at [facility]  in the operating room.   Desires IUD after delivery.   3. Hypertension affecting pregnancy in third trimester BP WNL today  4. History of gestational diabetes GTT today  5. History of cesarean delivery Discussed TOLAC CS at [facility]- Failed induction---> on Pit for 24 hours, ROM for 12 hours, maxed pit, no cervical change in 24 hours (got to 3 cm), EBL 1118   Preterm labor symptoms and general obstetric precautions including but not limited to vaginal bleeding, contractions, leaking of fluid and fetal movement were reviewed in detail with the patient. Please refer to After Visit Summary for other counseling recommendations.   Return in about 2 weeks (around 03/08/2024) for Routine prenatal care.  Future Appointments  Date Time Provider Department Center  03/16/2024  9:45 AM [facility] NST [facility]  03/19/2024  9:45 AM [facility] NST [facility]  03/23/2024  3:15 PM Delores Nidia CROME, FNP [facility]  04/02/2024 10:45 AM [facility] NST [facility]  04/06/2024  9:55 AM Nicholaus Burnard HERO, MD [facility]  04/09/2024  9:15 AM [facility] PROVIDER 1 [facility]  04/09/2024  9:30 AM [facility] US1 [facility]  04/12/2024 10:55 AM Nicholaus Burnard HERO, MD [facility]  04/16/2024  9:15 AM [facility] PROVIDER 1 [facility]  04/16/2024  9:30 AM [facility] US1 [facility]  04/16/2024 10:45 AM [facility] NST [facility]  04/19/2024 10:15 AM Nicholaus Burnard HERO, MD [facility]  04/27/2024  1:15 PM Zina Jerilynn LABOR, MD [facility]  05/04/2024  8:15 AM Izell Harari, MD [facility]  05/10/2024  1:55 PM Eveline Lynwood MATSU, MD [facility]    Suzen Maryan Masters, MD

## 2024-02-24 ENCOUNTER — Ambulatory Visit: Payer: Self-pay | Admitting: Family Medicine

## 2024-02-24 ENCOUNTER — Encounter: Payer: Self-pay | Admitting: Family Medicine

## 2024-02-24 DIAGNOSIS — O24913 Unspecified diabetes mellitus in pregnancy, third trimester: Secondary | ICD-10-CM

## 2024-02-24 LAB — GLUCOSE TOLERANCE, 2 HOURS W/ 1HR
Glucose, 1 hour: 203 mg/dL — ABNORMAL HIGH (ref 70–179)
Glucose, 2 hour: 147 mg/dL (ref 70–152)
Glucose, Fasting: 104 mg/dL — ABNORMAL HIGH (ref 70–91)

## 2024-02-24 LAB — RUBELLA SCREEN: Rubella Antibodies, IGG: 1.48 {index} (ref >0.99)

## 2024-02-24 LAB — CBC
Hematocrit: 36.4 % (ref 34.0–46.6)
Hemoglobin: 11.7 g/dL (ref 11.1–15.9)
MCH: 29.4 pg (ref 26.6–33.0)
MCHC: 32.1 g/dL (ref 31.5–35.7)
MCV: 92 fL (ref 79–97)
Platelets: 330 x10E3/uL (ref 150–450)
RBC: 3.98 x10E6/uL (ref 3.77–5.28)
RDW: 13 % (ref 11.7–15.4)
WBC: 10.4 x10E3/uL (ref 3.4–10.8)

## 2024-02-24 LAB — HEPATITIS B SURFACE ANTIGEN: Hepatitis B Surface Ag: NEGATIVE

## 2024-02-24 LAB — RPR: RPR Ser Ql: NONREACTIVE

## 2024-02-24 LAB — HIV ANTIBODY (ROUTINE TESTING W REFLEX): HIV Screen 4th Generation wRfx: NONREACTIVE

## 2024-02-24 LAB — HEPATITIS C ANTIBODY: Hep C Virus Ab: NONREACTIVE

## 2024-02-24 MED ORDER — ACCU-CHEK SOFTCLIX LANCETS MISC: 120.0000 | Freq: Four times a day (QID) | 12 refills | Status: DC

## 2024-02-24 MED ORDER — GLUCOSE BLOOD VI STRP: ORAL_STRIP | 12 refills | Status: DC

## 2024-02-24 MED ORDER — BLOOD GLUCOSE MONITORING SUPPL DEVI: 0 refills | Status: DC

## 2024-02-25 NOTE — Telephone Encounter (Addendum)
 RN called pt to assist with scheduling GDM education appointment.  Pt requested to know if the visit was necessary since she has experienced gdm in past.  I advised pt that Dr. Eldonna ordered the referral for clinical staff to have her scheduled, pt was agreeable to education appointment 03/02/24 at 08:15am.    Waddell, RN  ----- Message from Suzen Maryan Eldonna sent at 02/24/2024  5:02 PM EDT ----- Labs shows GDM. Please call patient to discuss and I tried to order supplies/DM education. Let me know if I did it incorrectly ----- Message ----- From: Interface, [facility] Lab Results In Sent: 02/24/2024  10:36 AM EDT To: Suzen Maryan Eldonna, MD

## 2024-03-01 DIAGNOSIS — O24410 Gestational diabetes mellitus in pregnancy, diet controlled: Secondary | ICD-10-CM | POA: Insufficient documentation

## 2024-03-02 ENCOUNTER — Telehealth: Payer: Self-pay | Admitting: Dietician

## 2024-03-02 ENCOUNTER — Other Ambulatory Visit

## 2024-03-02 NOTE — Telephone Encounter (Signed)
 Patient called as she missed her appointment with me this am.  I told patient that I have an 11:15 slot open but she stated that she had to take her children to the dentist.  Instructed patient to call to reschedule.  Leita Constable, RD, LDN, CDCES, DipACLM

## 2024-03-05 ENCOUNTER — Ambulatory Visit

## 2024-03-05 ENCOUNTER — Other Ambulatory Visit: Payer: Self-pay | Admitting: *Deleted

## 2024-03-05 ENCOUNTER — Ambulatory Visit: Attending: Obstetrics | Admitting: Maternal & Fetal Medicine

## 2024-03-05 VITALS — BP 123/74 | HR 98

## 2024-03-05 DIAGNOSIS — O24410 Gestational diabetes mellitus in pregnancy, diet controlled: Secondary | ICD-10-CM

## 2024-03-05 DIAGNOSIS — E669 Obesity, unspecified: Secondary | ICD-10-CM

## 2024-03-05 DIAGNOSIS — O3660X0 Maternal care for excessive fetal growth, unspecified trimester, not applicable or unspecified: Secondary | ICD-10-CM

## 2024-03-05 DIAGNOSIS — O99213 Obesity complicating pregnancy, third trimester: Secondary | ICD-10-CM

## 2024-03-05 DIAGNOSIS — O10013 Pre-existing essential hypertension complicating pregnancy, third trimester: Secondary | ICD-10-CM | POA: Insufficient documentation

## 2024-03-05 DIAGNOSIS — O09293 Supervision of pregnancy with other poor reproductive or obstetric history, third trimester: Secondary | ICD-10-CM

## 2024-03-05 DIAGNOSIS — O09893 Supervision of other high risk pregnancies, third trimester: Secondary | ICD-10-CM | POA: Insufficient documentation

## 2024-03-05 DIAGNOSIS — O34219 Maternal care for unspecified type scar from previous cesarean delivery: Secondary | ICD-10-CM | POA: Insufficient documentation

## 2024-03-05 DIAGNOSIS — O0990 Supervision of high risk pregnancy, unspecified, unspecified trimester: Secondary | ICD-10-CM | POA: Diagnosis not present

## 2024-03-05 DIAGNOSIS — Z3A30 30 weeks gestation of pregnancy: Secondary | ICD-10-CM

## 2024-03-05 DIAGNOSIS — O3663X0 Maternal care for excessive fetal growth, third trimester, not applicable or unspecified: Secondary | ICD-10-CM | POA: Insufficient documentation

## 2024-03-05 DIAGNOSIS — O10913 Unspecified pre-existing hypertension complicating pregnancy, third trimester: Secondary | ICD-10-CM

## 2024-03-05 DIAGNOSIS — O162 Unspecified maternal hypertension, second trimester: Secondary | ICD-10-CM

## 2024-03-05 DIAGNOSIS — O99212 Obesity complicating pregnancy, second trimester: Secondary | ICD-10-CM

## 2024-03-05 NOTE — Progress Notes (Signed)
 Patient information  Patient Name: Teresa [name]  Patient MRN:   [id number]  Referring practice: MFM Referring Provider: [facility])  Problem List   Patient Active Problem List   Diagnosis Date Noted   Gestational diabetes, diet controlled 03/01/2024   History of cesarean delivery 11/28/2023   History of gestational diabetes 11/28/2023   Supervision of high risk pregnancy, antepartum 10/23/2023   Hypertension affecting pregnancy 10/23/2023   PCOS (polycystic ovarian syndrome) 03/05/2021   Maternal Fetal medicine Consult  Pollyanna C Boback is a 33 y.o. H5E7987 at [redacted]w[redacted]d here for ultrasound and consultation. Marketia C Ilagan is doing well today with no acute concerns. Today we focused on the following:   CHTN: Blood pressure is well-controlled on Procardia .  Discussed blood pressure goals and the need to monitor for preeclampsia  Gestational diabetes: The patient was recently diagnosed with this and reports that her blood sugars are mostly controlled after eating but her fastings are elevated.  I discussed the importance of diet and exercise.  We discussed the adverse potential consequences associate with poor control during pregnancy including stillbirth, large for gestational age fetus, early delivery and need for cesarean delivery.  I encouraged the patient to consume a diabetic diet and increase exercise/activity.  I discussed the ultrasound finding of large for gestational age is reflective of poor control.  Currently the estimated fetal weight is at the 97th percentile.  Patient also notes increased vaginal pressure.  I asked if she believes that she needs to go to the hospital for assessment.  She said that she thinks she does not need to do that right now and she will continue to monitor her symptoms.  She denies vaginal bleeding or loss of fluid.  The patient had time to ask questions that were answered to her satisfaction.  She verbalized  understanding and agrees to proceed with the plan below.  Recommendations -Serial growth ultrasounds every 4-6 weeks until delivery -Antenatal testing to start around 32 weeks  -Delivery around [redacted] weeks gestation -Start PM NPH if fasting blood sugars do not improve in a week  Review of Systems: A review of systems was performed and was negative except per HPI   Vitals and Physical Exam    03/05/2024    9:06 AM 02/23/2024   10:16 AM 02/17/2024    3:12 PM  Vitals with BMI  Weight  361 lbs   BMI  66.01   Systolic 123 134 851  Diastolic 74 82 82  Pulse 98 118 108    Sitting comfortably on the sonogram table Nonlabored breathing Normal rate and rhythm Abdomen is nontender  Past pregnancies OB History  Gravida Para Term Preterm AB Living  4 2 2  0 1 2  SAB IAB Ectopic Multiple Live Births  0 1 0 1 2    # Outcome Date GA Lbr Len/2nd Weight Sex Type Anes PTL Lv  4A Term 03/05/23 [redacted]w[redacted]d  6 lb 11 oz (3.033 kg) F CS-Unspec Spinal  LIV     Complications: Failure to progress in labor  4B Current           3 Term 11/27/11 [redacted]w[redacted]d 702:35 / 01:55 8 lb 5.7 oz (3.79 kg) F Vag-Spont EPI  LIV  2 IAB 2010 [redacted]w[redacted]d         1 Gravida             I spent 30 minutes reviewing the patients chart, including labs and images  as well as counseling the patient about her medical conditions. Greater than 50% of the time was spent in direct face-to-face patient counseling.  Delora Smaller  MFM, [facility]   03/05/2024  9:47 AM

## 2024-03-08 NOTE — Progress Notes (Unsigned)
 PRENATAL VISIT NOTE  Subjective:  Teresa [name] is a 33 y.o. [id number] at [redacted]w[redacted]d being seen today for ongoing prenatal care.  She is currently monitored for the following issues for this high-risk pregnancy and has Supervision of high risk pregnancy, antepartum; Pre-existing essential hypertension affecting pregnancy in third trimester; PCOS (polycystic ovarian syndrome); History of cesarean delivery; History of gestational diabetes; Gestational diabetes, diet controlled; and Large for gestational age fetus affecting mother, antepartum, third trimester, single gestation on their problem list.  Patient reports {sx:14538}.   .  .   . Denies leaking of fluid.   The following portions of the patient's history were reviewed and updated as appropriate: allergies, current medications, past family history, past medical history, past social history, past surgical history and problem list.   Objective:   There were no vitals filed for this visit.  Fetal Status:           General: Alert, oriented and cooperative. Patient is in no acute distress.  Skin: Skin is warm and dry. No rash noted.   Cardiovascular: Normal heart rate noted  Respiratory: Normal respiratory effort, no problems with respiration noted  Abdomen: Soft, gravid, appropriate for gestational age.        Pelvic: Cervical exam deferred        Extremities: Normal range of motion.     Mental Status: Normal mood and affect. Normal behavior. Normal judgment and thought content.      10/27/2023   10:33 AM 07/02/2018    5:21 PM  Depression screen PHQ 2/9  Decreased Interest 0 3  Down, Depressed, Hopeless 0 0  PHQ - 2 Score 0 3  Altered sleeping 0 3  Tired, decreased energy 0 3  Change in appetite 0 3  Feeling bad or failure about yourself  0 1  Trouble concentrating 0 0  Moving slowly or fidgety/restless 0 0  Suicidal thoughts 0 0  PHQ-9 Score 0  13      Data saved with a previous flowsheet row definition        10/27/2023    10:35 AM 07/02/2018    5:22 PM  GAD 7 : Generalized Anxiety Score  Nervous, Anxious, on Edge 0 3  Control/stop worrying 0 3  Worry too much - different things 0 3  Trouble relaxing 0 3  Restless 0 3  Easily annoyed or irritable 0 3  Afraid - awful might happen 0 1  Total GAD 7 Score 0 19    Assessment and Plan:  Pregnancy: [id number] at [redacted]w[redacted]d 1. Supervision of high risk pregnancy, antepartum (Primary) Prenatal course reviewed BP, HR, FHR within normal limits Feeling regular FM   2. Large for gestational age fetus affecting mother, antepartum, third trimester, single gestation ***  3. Diet controlled gestational diabetes mellitus (GDM) in third trimester Current regimen: start PM NPH if fasting sugars have not improved{Blank multiple:19196::MTF 500 bid,MTF 1000 bid,Insulin, ***,Dexcom/Omnipod} CBG review: *** Regimen changes: {Blank multiple:19196::***,none} Growth US : every 4-6 weeks until delivery Antenatal monitoring: Continue weekly testing and Begin antenatal testing at 32 weeks Deliver around 37 weeks per MFM  4. Pre-existing essential hypertension affecting pregnancy in third trimester Continue aspirin  81 mg daily   5. History of cesarean delivery Discussed TOLAC with Dr. Eldonna on 10/27  6. [redacted] weeks gestation of pregnancy ***  Preterm labor symptoms and general obstetric precautions including but not limited to vaginal bleeding, contractions, leaking of fluid and fetal movement were reviewed in detail with the patient.  Please refer to After Visit Summary for other counseling recommendations.   No follow-ups on file.  Future Appointments  Date Time Provider Department Center  03/09/2024  8:15 AM Wallace Joesph LABOR, [place] [facility]  03/09/2024  9:45 AM [facility] NST [facility]  03/16/2024  9:45 AM [facility] NST [facility]  03/19/2024  9:45 AM [facility] NST [facility]  03/23/2024  3:15 PM Delores Nidia CROME, FNP [facility]  04/02/2024 10:45 AM [facility] NST  [facility]  04/09/2024  9:15 AM [facility] PROVIDER 1 [facility]  04/09/2024  9:30 AM [facility] US1 [facility]  04/16/2024  9:15 AM [facility] PROVIDER 1 [facility]  04/16/2024  9:30 AM [facility] US1 [facility]  04/16/2024 10:45 AM [facility] NST [facility]    Joesph LABOR Wallace, PA

## 2024-03-09 ENCOUNTER — Ambulatory Visit: Admitting: Family Medicine

## 2024-03-09 ENCOUNTER — Ambulatory Visit: Attending: Obstetrics and Gynecology

## 2024-03-09 ENCOUNTER — Other Ambulatory Visit: Payer: Self-pay

## 2024-03-09 ENCOUNTER — Other Ambulatory Visit (HOSPITAL_COMMUNITY)
Admission: RE | Admit: 2024-03-09 | Discharge: 2024-03-09 | Disposition: A | Discharge status: Home / Self Care | Source: Ambulatory Visit | Attending: Family Medicine | Admitting: Family Medicine

## 2024-03-09 VITALS — BP 136/79 | HR 103 | Wt 357.1 lb

## 2024-03-09 VITALS — BP 139/77 | HR 104

## 2024-03-09 DIAGNOSIS — O24410 Gestational diabetes mellitus in pregnancy, diet controlled: Secondary | ICD-10-CM | POA: Insufficient documentation

## 2024-03-09 DIAGNOSIS — O0990 Supervision of high risk pregnancy, unspecified, unspecified trimester: Secondary | ICD-10-CM | POA: Diagnosis present

## 2024-03-09 DIAGNOSIS — O99213 Obesity complicating pregnancy, third trimester: Secondary | ICD-10-CM | POA: Insufficient documentation

## 2024-03-09 DIAGNOSIS — O0993 Supervision of high risk pregnancy, unspecified, third trimester: Secondary | ICD-10-CM

## 2024-03-09 DIAGNOSIS — Z98891 History of uterine scar from previous surgery: Secondary | ICD-10-CM

## 2024-03-09 DIAGNOSIS — O10013 Pre-existing essential hypertension complicating pregnancy, third trimester: Secondary | ICD-10-CM

## 2024-03-09 DIAGNOSIS — Z3A31 31 weeks gestation of pregnancy: Secondary | ICD-10-CM | POA: Diagnosis not present

## 2024-03-09 DIAGNOSIS — O3663X0 Maternal care for excessive fetal growth, third trimester, not applicable or unspecified: Secondary | ICD-10-CM

## 2024-03-09 DIAGNOSIS — N898 Other specified noninflammatory disorders of vagina: Secondary | ICD-10-CM

## 2024-03-09 NOTE — Procedures (Signed)
 Teresa [name] 1991-01-09 [redacted]w[redacted]d  Fetus A Non-Stress Test Interpretation for 03/09/24  Indication: Chronic Hypertension, Gestational Diabetes medication controlled, and Obesity - NST only  Fetal Heart Rate A Mode: External Baseline Rate (A): 140 bpm Variability: Moderate Accelerations: 15 x 15 Decelerations: None Multiple birth?: No  Uterine Activity Mode: Palpation, Toco Contraction Frequency (min): none noted Resting Tone Palpated: Relaxed  Interpretation (Fetal Testing) Nonstress Test Interpretation: Reactive Comments: Reviewed with Dr. William

## 2024-03-10 ENCOUNTER — Encounter: Payer: Self-pay | Admitting: Family Medicine

## 2024-03-10 LAB — CERVICOVAGINAL ANCILLARY ONLY
Bacterial Vaginitis (gardnerella): POSITIVE — AB
Candida Glabrata: NEGATIVE
Candida Vaginitis: NEGATIVE
Comment: NEGATIVE
Comment: NEGATIVE
Comment: NEGATIVE

## 2024-03-11 ENCOUNTER — Ambulatory Visit: Payer: Self-pay | Admitting: Family Medicine

## 2024-03-11 DIAGNOSIS — B9689 Other specified bacterial agents as the cause of diseases classified elsewhere: Secondary | ICD-10-CM

## 2024-03-11 MED ORDER — METRONIDAZOLE 500 MG PO TABS: 500.0000 mg | ORAL_TABLET | Freq: Two times a day (BID) | ORAL | 0 refills | Status: AC

## 2024-03-16 ENCOUNTER — Ambulatory Visit: Attending: Obstetrics and Gynecology | Admitting: Obstetrics

## 2024-03-16 VITALS — BP 139/85 | HR 101

## 2024-03-16 DIAGNOSIS — E669 Obesity, unspecified: Secondary | ICD-10-CM | POA: Diagnosis not present

## 2024-03-16 DIAGNOSIS — Z3A31 31 weeks gestation of pregnancy: Secondary | ICD-10-CM

## 2024-03-16 DIAGNOSIS — O10013 Pre-existing essential hypertension complicating pregnancy, third trimester: Secondary | ICD-10-CM

## 2024-03-16 DIAGNOSIS — O99213 Obesity complicating pregnancy, third trimester: Secondary | ICD-10-CM

## 2024-03-16 DIAGNOSIS — O24410 Gestational diabetes mellitus in pregnancy, diet controlled: Secondary | ICD-10-CM | POA: Diagnosis not present

## 2024-03-16 NOTE — Progress Notes (Signed)
 Teresa [name] 1990/08/28 [redacted]w[redacted]d  Fetus A Non-Stress Test Interpretation for 03/16/24  Indication: Chronic Hypertension and Gestational Diabetes, diet controlled, Obesity, NST Only  Fetal Heart Rate A Mode: External Baseline Rate (A): 150 bpm Variability: Moderate Accelerations: 10 x 10 Decelerations: None Multiple birth?: No  Uterine Activity Mode: Toco Contraction Frequency (min): none noted  Interpretation (Fetal Testing) Nonstress Test Interpretation: Reactive Overall Impression: Reassuring for gestational age Comments: Reviewed by Dr. Ileana

## 2024-03-19 ENCOUNTER — Encounter (HOSPITAL_COMMUNITY): Payer: Self-pay | Admitting: Obstetrics & Gynecology

## 2024-03-19 ENCOUNTER — Ambulatory Visit: Attending: Maternal & Fetal Medicine | Admitting: *Deleted

## 2024-03-19 ENCOUNTER — Inpatient Hospital Stay (HOSPITAL_COMMUNITY)
Admission: AD | Admit: 2024-03-19 | Discharge: 2024-03-19 | Disposition: A | Discharge status: Home / Self Care | Attending: Obstetrics & Gynecology | Admitting: Obstetrics & Gynecology

## 2024-03-19 DIAGNOSIS — O10913 Unspecified pre-existing hypertension complicating pregnancy, third trimester: Secondary | ICD-10-CM | POA: Diagnosis not present

## 2024-03-19 DIAGNOSIS — Z3689 Encounter for other specified antenatal screening: Secondary | ICD-10-CM

## 2024-03-19 DIAGNOSIS — O3663X0 Maternal care for excessive fetal growth, third trimester, not applicable or unspecified: Secondary | ICD-10-CM | POA: Diagnosis not present

## 2024-03-19 DIAGNOSIS — Z3A32 32 weeks gestation of pregnancy: Secondary | ICD-10-CM | POA: Insufficient documentation

## 2024-03-19 DIAGNOSIS — O2343 Unspecified infection of urinary tract in pregnancy, third trimester: Secondary | ICD-10-CM | POA: Insufficient documentation

## 2024-03-19 DIAGNOSIS — L989 Disorder of the skin and subcutaneous tissue, unspecified: Secondary | ICD-10-CM

## 2024-03-19 DIAGNOSIS — O24410 Gestational diabetes mellitus in pregnancy, diet controlled: Secondary | ICD-10-CM | POA: Diagnosis present

## 2024-03-19 LAB — WET PREP, GENITAL
Clue Cells Wet Prep HPF POC: NONE SEEN
Sperm: NONE SEEN
Trich, Wet Prep: NONE SEEN
WBC, Wet Prep HPF POC: <10 (ref <10)
Yeast Wet Prep HPF POC: NONE SEEN

## 2024-03-19 LAB — URINALYSIS, ROUTINE W REFLEX MICROSCOPIC
Bacteria, UA: NONE SEEN
Bilirubin Urine: NEGATIVE
Glucose, UA: NEGATIVE mg/dL
Ketones, ur: 5 mg/dL — AB
Nitrite: NEGATIVE
Protein, ur: 100 mg/dL — AB
RBC / HPF: >50 RBC/hpf (ref 0–5)
Specific Gravity, Urine: 1.024 (ref 1.005–1.030)
pH: 6 (ref 5.0–8.0)

## 2024-03-19 MED ORDER — CEFADROXIL 500 MG PO CAPS
500.0000 mg | ORAL_CAPSULE | Freq: Two times a day (BID) | ORAL | 0 refills | Status: AC
Start: 2024-03-19 — End: 2024-03-29

## 2024-03-19 MED ORDER — NYSTATIN-TRIAMCINOLONE 100000-0.1 UNIT/GM-% EX CREA: TOPICAL_CREAM | CUTANEOUS | 0 refills | Status: DC

## 2024-03-19 NOTE — MAU Note (Signed)
.  Teresa [name] is a 33 y.o. at [redacted]w[redacted]d here in MAU reporting: swelling in area between vagina and butthole since a couple days ago. Denies LOF, VB, and CTX. Reports +FM. Pt requests y'all just need to look at my vagina it is so swollen. Pt states she last had sexual intercourse 1-2 days ago, when asked about roughness and consent pt states she did give consent and described the sexual intercourse to be rough as she was bent over in position. Pt states the pain in her perineum stings when urine touches the site.   LMP: na Onset of complaint: couple days ago Pain score: denies There were no vitals filed for this visit.   FHT: 150  Lab orders placed from triage: na

## 2024-03-19 NOTE — Procedures (Signed)
 Teresa [name] October 28, 1990 [redacted]w[redacted]d  Fetus A Non-Stress Test Interpretation for 03/19/24- nst only  Indication: Chronic Hypertension and LGA  Fetal Heart Rate A Mode: External Baseline Rate (A): 150 bpm Variability: Moderate Accelerations: 15 x 15 Decelerations: None Multiple birth?: No  Uterine Activity Mode: Toco Contraction Frequency (min): none Resting Tone Palpated: Relaxed  Interpretation (Fetal Testing) Nonstress Test Interpretation: Reactive Comments: Tracing reviewed by Dr. arna

## 2024-03-19 NOTE — MAU Provider Note (Signed)
 Event Date/Time   First Provider Initiated Contact with Patient 03/19/24 1205      S/HPI Teresa [name] is a 33 y.o. [phone] patient who presents to MAU today with complaint of reporting that she is 32 weeks and 4 days and noticed that she has had a swelling in her perineum for the last couple of days patient reports that she had recent vigorous sexual activity 1 to 2 days ago and that is when the area started to become swollen.  Patient reports it does sting when she urinates or whenever the site is touched but denies any dysuria, burning with urination, vaginal irritation or vaginal discharge.  Patient offers no obstetrical complaints.  She denies any vaginal bleeding, leaking of fluid, contractions, and reports good fetal movements   [facility]renatal care [facility] received [facility].  And her pregnancy has been complicated by pre-existing chronic hypertension, PCOS, history of a cesarean delivery, GDM, LGA fetus, morbid obesity   Remainder the ROS is negative unless otherwise noted above in HPI  O BP 138/86   Pulse (!) 106   Ht 5' 2 (1.575 m)   Wt (!) 162.4 kg   LMP 07/17/2023 (Approximate)   BMI 65.48 kg/m  Physical Exam Vitals and nursing note reviewed. Exam conducted with a chaperone present.  Constitutional:      General: She is not in acute distress.    Appearance: Normal appearance. She is obese. She is not ill-appearing.     Comments: BMI 65  HENT:     Head: Normocephalic.     Nose: Nose normal.  Cardiovascular:     Rate and Rhythm: Normal rate.  Pulmonary:     Effort: Pulmonary effort is normal.  Abdominal:     Palpations: Abdomen is soft.     Tenderness: There is no abdominal tenderness.  Genitourinary:    General: Normal vulva.     Exam position: Lithotomy position.      Comments: Edematous area noted above, no visualized lesions, no erythema, no abscess palpated. Likely related to recent vigorous sexually activity   Musculoskeletal:        General:  Normal range of motion.     Cervical back: Normal range of motion.  Skin:    General: Skin is warm.  Neurological:     Mental Status: She is alert and oriented to [place], place, and time.  Psychiatric:        Mood and Affect: Mood normal.        Behavior: Behavior normal.    The pelvic exam above was chaperoned by Ronal Eriksson RN   MDM  HIGH  Prenatal chart reviewed Physical exam performed with pelvic  Vaginal swabs obtained ( Wet prep negative) GC pending at discharge UA: Large blood, trace leukocytes, 100 protein with ketones and cloudy appearance suspicious for UTI.  Will send for culture and treat prophylactically with Duricef 500 mg BID x 10 days    Will send prescription for Mycolog for external use of edematous area in the perineum that patient is concerned about.  Prescription at discharge  NST for gestational age and fetal reassurance ( Reactive)   FHRT: Reactive Baseline : 140 Variability: Moderate  Accelerations Decelerations Toco: Quite   Orders Placed This Encounter  Procedures   Wet prep, genital    Standing Status:   Standing    Number of Occurrences:   1   Culture, OB Urine    Standing Status:   Standing  Number of Occurrences:   1   Urinalysis, Routine w reflex microscopic -Urine, Random    Standing Status:   Standing    Number of Occurrences:   1    Specimen Source:   Urine, Random [244]   Discharge patient Discharge disposition: 01-Home or Self Care; Discharge patient date: 03/19/2024    Standing Status:   Standing    Number of Occurrences:   1    Discharge disposition:   01-Home or Self Care [1]    Discharge patient date:   03/19/2024      Results for orders placed or performed during the hospital encounter of 03/19/24 (from the past 24 hours)  Urinalysis, Routine w reflex microscopic -Urine, Random     Status: Abnormal   Collection Time: 03/19/24 12:09 PM  Result Value Ref Range   Color, Urine YELLOW YELLOW   APPearance CLOUDY (A)  CLEAR   Specific Gravity, Urine 1.024 1.005 - 1.030   pH 6.0 5.0 - 8.0   Glucose, UA NEGATIVE NEGATIVE mg/dL   Hgb urine dipstick LARGE (A) NEGATIVE   Bilirubin Urine NEGATIVE NEGATIVE   Ketones, ur 5 (A) NEGATIVE mg/dL   Protein, ur 899 (A) NEGATIVE mg/dL   Nitrite NEGATIVE NEGATIVE   Leukocytes,Ua TRACE (A) NEGATIVE   RBC / HPF >50 0 - 5 RBC/hpf   WBC, UA 0-5 0 - 5 WBC/hpf   Bacteria, UA NONE SEEN NONE SEEN   Squamous Epithelial / HPF 11-20 0 - 5 /HPF   Mucus PRESENT   Wet prep, genital     Status: None   Collection Time: 03/19/24 12:25 PM  Result Value Ref Range   Yeast Wet Prep HPF POC NONE SEEN NONE SEEN   Trich, Wet Prep NONE SEEN NONE SEEN   Clue Cells Wet Prep HPF POC NONE SEEN NONE SEEN   WBC, Wet Prep HPF POC <10 <10   Sperm NONE SEEN      I have reviewed the patient chart and performed the physical exam . I have ordered & interpreted the lab results and reviewed and interpreted the NST Medications ordered as stated below.  A/P as described below.  Counseling and education provided and patient agreeable  with plan as described below. Verbalized understanding.    ASSESSMENT Medical screening exam complete  Perineal irritation in female  [redacted] weeks gestation of pregnancy  NST (non-stress test) reactive on fetal surveillance  Urinary tract infection in mother during third trimester of pregnancy    PLAN Future Appointments  Date Time Provider Department Center  03/23/2024 10:15 AM [facility] US2 [facility]  03/23/2024  3:15 PM Delores Nidia CROME, FNP [facility]  04/02/2024 10:45 AM [facility] NST [facility]  04/06/2024  9:55 AM Nicholaus Burnard HERO, MD [facility]  04/09/2024  9:15 AM [facility] PROVIDER 1 [facility]  04/09/2024  9:30 AM [facility] US1 [facility]  04/12/2024 10:55 AM Nicholaus Burnard HERO, MD [facility]  04/16/2024  9:15 AM [facility] PROVIDER 1 [facility]  04/16/2024  9:30 AM [facility] US1 [facility]  04/16/2024 10:45 AM [facility] NST [facility]  04/19/2024 10:15  AM Nicholaus Burnard HERO, MD [facility]  04/27/2024  1:15 PM Zina Jerilynn LABOR, MD [facility]  05/04/2024  8:15 AM Izell Harari, MD [facility]  05/10/2024  1:55 PM Eveline Lynwood MATSU, MD [facility]    Discharge from MAU in stable condition  See AVS for full description of educational information and instructions provided to the patient at time  of discharge   Warning signs for worsening condition that would warrant emergency follow-up discussed  Patient may return to MAU as needed   Littie Olam LABOR, NP 03/19/2024 2:57 PM   This chart was dictated using voice recognition software, Dragon. Despite the best efforts of this provider to proofread and correct errors, errors may still occur which can change documentation meaning.

## 2024-03-20 ENCOUNTER — Other Ambulatory Visit: Payer: Self-pay

## 2024-03-20 ENCOUNTER — Encounter (HOSPITAL_COMMUNITY): Payer: Self-pay | Admitting: Obstetrics & Gynecology

## 2024-03-20 ENCOUNTER — Inpatient Hospital Stay (HOSPITAL_COMMUNITY)
Admission: AD | Admit: 2024-03-20 | Discharge: 2024-03-20 | Disposition: A | Discharge status: Home / Self Care | Attending: Obstetrics & Gynecology | Admitting: Obstetrics & Gynecology

## 2024-03-20 DIAGNOSIS — O10013 Pre-existing essential hypertension complicating pregnancy, third trimester: Secondary | ICD-10-CM | POA: Insufficient documentation

## 2024-03-20 DIAGNOSIS — O99810 Abnormal glucose complicating pregnancy: Secondary | ICD-10-CM | POA: Diagnosis not present

## 2024-03-20 DIAGNOSIS — O09293 Supervision of pregnancy with other poor reproductive or obstetric history, third trimester: Secondary | ICD-10-CM | POA: Insufficient documentation

## 2024-03-20 DIAGNOSIS — O0990 Supervision of high risk pregnancy, unspecified, unspecified trimester: Secondary | ICD-10-CM

## 2024-03-20 DIAGNOSIS — Z3A32 32 weeks gestation of pregnancy: Secondary | ICD-10-CM | POA: Insufficient documentation

## 2024-03-20 DIAGNOSIS — O24410 Gestational diabetes mellitus in pregnancy, diet controlled: Secondary | ICD-10-CM

## 2024-03-20 DIAGNOSIS — O368130 Decreased fetal movements, third trimester, not applicable or unspecified: Secondary | ICD-10-CM | POA: Diagnosis present

## 2024-03-20 DIAGNOSIS — O24419 Gestational diabetes mellitus in pregnancy, unspecified control: Secondary | ICD-10-CM | POA: Diagnosis present

## 2024-03-20 DIAGNOSIS — R739 Hyperglycemia, unspecified: Secondary | ICD-10-CM | POA: Diagnosis present

## 2024-03-20 LAB — GLUCOSE, CAPILLARY: Glucose-Capillary: 157 mg/dL — ABNORMAL HIGH (ref 70–99)

## 2024-03-20 NOTE — Discharge Instructions (Signed)
 Please go to the MAU (Maternity Assessment Unit) at [facility] if you experience vaginal bleeding,leaking/gush of fluid like your water broke, notice decreased movement from your baby after doing kick counts, or contractions the are becoming more intense or more frequent.   Fetal Movement Counts When you're pregnant, you might start feeling your baby move around the middle of your pregnancy. At first, these movements might feel like flutters, rolls, or swishes. As your baby grows, you might feel more kicks and jabs. Around week 28 of your pregnancy, your health care team may ask you to count how often your baby moves. This is important for all pregnancies, but especially for high-risk ones. Counting movements can help lessen the risk of stillbirth.  What is a fetal movement count? A fetal movement count is the number of times that you feel your baby move during a certain amount of time. This may also be called a kick count. There are many ways to do a kick count. Ask your team what is best for you. Pay attention to when your baby is most active. You may notice your baby's sleep and wake cycles. You may also notice things that make your baby move more. When you do a kick count, try to do it: When your baby is normally most active. At the same time each day.  How do I count fetal movements? Find a quiet, comfortable area. Sit or lie down. Write down the date, the start time, and the number of movements you feel. Count kicks, flutters, swishes, rolls, and jabs. Usually, you will feel at least 10 movements within 2 hours. Stop counting after you have felt 10 movements or if you have been counting for 2 hours. Write down the stop time.  Contact a health care provider if:  You don't feel 10 movements in 2 hours. Your baby isn't moving as it usually does. Your baby isn't moving at all. If you're not able to reach your provider, go to an emergency room. This information is not intended to  replace advice given to you by your health care provider. Make sure you discuss any questions you have with your health care provider.  Document Revised: 05/09/2023 Document Reviewed: 05/01/2022 Elsevier Patient Education  2025 [employer].  Signs and Symptoms of Labor Labor is the body's natural process of moving the baby and the placenta out of the uterus. The process of labor usually starts when the baby is full-term, 37 weeks and 0 days or more.   As your body prepares for labor and the birth of your baby, you may notice the following symptoms in the weeks and days before true labor starts: Your baby dropping lower into your pelvis to get into position for birth (lightening). When this happens, you may feel more pressure on your bladder and pelvic bone and less pressure on your ribs. This may make it easier to breathe. It may also cause you to need to urinate more often and have problems with bowel movements. Having practice contractions, also called Braxton Hicks contractions or false labor. These occur at irregular (unevenly spaced) intervals that are more than 10 minutes apart. False labor contractions are common after exercise or sexual activity. They will stop if you change position, rest, or drink fluids. These contractions are usually mild and do not get stronger over time. They may feel like: A backache or back pain. Mild cramps, similar to menstrual cramps. Tightening or pressure in your abdomen. Passing a small amount of  thick, bloody mucus from your vagina. This is called normal bloody show or losing your mucus plug. This may happen more than a week before labor begins, or right before labor begins, as the opening of the cervix starts to widen (dilate). For some women, the entire mucus plug passes at once. For others, pieces of the mucus plug may gradually pass over several days.  Signs and symptoms that labor has begun Signs that you are in labor may include: Having  contractions that come at regular (evenly spaced) intervals and increase in intensity. This may feel like more intense tightening or pressure in your abdomen that moves to your back. Feeling pressure in the vaginal area. Your water breaking (called rupture of membranes). This is when the sac of fluid that surrounds your baby breaks. Fluid leaking from your vagina may be clear or blood-tinged. Labor usually starts within 24 hours of your water breaking, but it may take longer to begin. Some people may feel a sudden gush of fluid; others may notice repeatedly damp underwear.  Go to the hospital when  Your water breaks. Your labor has started: painful, regular contractions that are 5 minutes apart or less. You have a fever. You have bright red blood coming from your vagina. You do not feel your baby moving. You have a severe headache with or without vision problems. You have chest pain or shortness of breath. These symptoms may represent a serious problem that is an emergency. Do not wait to see if the symptoms will go away. Get medical help right away. Call your local emergency services (911 in the [place] Do not drive yourself to the hospital.  Summary Labor is your body's natural process of moving your baby and the placenta out of your uterus. The process of labor usually starts when your baby is full-term When labor starts, or if your water breaks, call your health care provider or nurse care line. Based on your situation, they will determine when you should go in for an exam. This information is not intended to replace advice given to you by your health care provider. Make sure you discuss any questions you have with your health care provider.  Document Revised: 08/29/2020 Document Reviewed: 08/29/2020-- adapted Elsevier Patient Education  2024 [employer].

## 2024-03-20 NOTE — MAU Note (Signed)
 Patient is a [id number], [redacted]w[redacted]d that presents with complaint of gestational diabetes. Patient states that she has not ate since noon today and noticed her glucose going from 115 to 138 at home. Patient then states that she has had a decrease in fetal movement and knows that is a sign of high blood sugar. Patient states that she is not on medication for control of diabetes and she called the on call line and they would not prescribe anything over the phone so she came to MAU. Patient felt fetal movement in triage. Patient states that she does have some abdominal cramping that she rates a 5 on a 0-10 pain scale. VSS. No bleeding or LOF. Monitors applied and assessing.

## 2024-03-20 NOTE — MAU Provider Note (Incomplete)
 History     CSN: [id number]  Arrival date and time: 03/20/24 2112 First Provider Initiated Contact with Patient   Chief Complaint  Patient presents with   Gestational Diabetes    HPI Teresa [name] is a 33 y.o. H5E7987 at [redacted]w[redacted]d, 05/10/2024, by Ultrasound, who presents to the Maternity Assessment Unit for elevated BG and DFM.  Patient states she ate a noon and 2h later, BG 215. She took a nap and when she woke, she noticed DFM. Patient states she had gDM in prior preg and noticed DFM when her glucose was high. BG was 138. She walked around inside the house and did some chores for about and noticed a slight increased in FM. Rechecked BG again, 143. She says she called the on-call provider who would not prescribe medication over the phone and thus presented to MAU.   FBG was 93 today, pt reports usually 90-95.   Patient was seen here yesterday for perineal discomfort. Physical exam performed and rx topical. UA was suspicious for UTI, Ucx sent and still pending at this time, and rx Cefadroxil .   ROS Contractions: No  Fetal Movement: Yes  Vaginal bleeding: No  LOF: No    Past Medical History:  Diagnosis Date   Abnormal Pap smear    Acid reflux    ADHD (attention deficit hyperactivity disorder)    Asthma    childhood /grew out of it   Breast mass, right 01/11/2014   Negative breast imaging. Radiologist at [facility] states cannot see or feel mass.      Chlamydia    Chronic cystitis 07/02/2018   Degenerative disk disease    Depression    post partem   Dysmenorrhea 04/15/2012   Fractured fibula    left   History of PCOS    Migraine    Syphilis    2023   Trichomonas    Urinary tract infection    Vaginal Pap smear, abnormal    normal since   Past Surgical History:  Procedure Laterality Date   CESAREAN SECTION     DILATION AND CURETTAGE OF UTERUS     INDUCED ABORTION     TONSILLECTOMY AND ADENOIDECTOMY N/A 08/02/2020   Procedure: TONSILLECTOMY AND  ADENOIDECTOMY;  Surgeon: Karis Clunes, MD;  Location: [facility] OR;  Service: ENT;  Laterality: N/A;   VAGINAL DELIVERY  2013   WISDOM TOOTH EXTRACTION     Allergies  Allergen Reactions   Other Anaphylaxis    Grapes, strawberries, bananas, pineapple, oranges, nuts   Peanut (Diagnostic) Anaphylaxis   Pineapple Anaphylaxis and Swelling   Betadine [Povidone Iodine] Itching    Betadine causes itching and skin redness and irritation   Povidone-Iodine Itching, Rash and Dermatitis    Betadine causes itching and skin redness and irritation   Pollen Extract Itching   Sulfa  Antibiotics Itching   Bee Pollen Itching   Iodine Itching   Latex Itching   Tomato Itching and Rash   Tramadol  Nausea And Vomiting and Nausea Only    Physical Exam  BP 134/79 (BP Location: Right Wrist)   Pulse (!) 105   Temp 98.5 F (36.9 C) (Oral)   Resp 18   Ht 5' 2 (1.575 m)   Wt (!) 165.3 kg   LMP 07/17/2023 (Approximate)   BMI 66.65 kg/m   Gen: alert, no acute distress CV: regular rate on monitor Resp: nonlabored Abd: nontender, gravid F>U by palpation  FHT Baseline: 135 bpm Variability: Good {> 6 bpm) Accelerations: Reactive  Decelerations: Absent Uterine activity: UI, peaks q5+ min  Labs CBG (last 3)  Recent Labs    03/20/24 2234  GLUCAP 157*    Assessment and Plan  MDM Teresa [name] is a 33 y.o. H5E7987 at [redacted]w[redacted]d, 05/10/2024, by Ultrasound, who presents to the MAU for hyperglycemia.  No orders of the defined types were placed in this encounter.  No orders of the defined types were placed in this encounter.  POC glucose 157. Patient reports no food since meal at noon and drinking only water . Hyperglycemia may represent non-fasted result or worsening insulin resistance. Advised patient to record BG, meals, and times of each and bring to next ROB in 3d. No new medications today based on only 1/2 day of hyperglycemia.    1. Supervision of high risk pregnancy, antepartum  2. Pre-existing essential  hypertension affecting pregnancy in third trimester  3. [redacted] weeks gestation of pregnancy - Discharge patient  4. Hyperglycemia during pregnancy (Primary) - Discharge patient  5. Diet controlled gestational diabetes mellitus (GDM) in third trimester - Discharge patient  Results pending at the time of DC: Ucx from 11/21 MAU  Dispo: DC home in stable condition with return precautions discussed and included in AVS.    Barabara Maier, DO FM-OB Fellow [facility]

## 2024-03-21 DIAGNOSIS — O09893 Supervision of other high risk pregnancies, third trimester: Secondary | ICD-10-CM | POA: Insufficient documentation

## 2024-03-21 LAB — CULTURE, OB URINE

## 2024-03-22 ENCOUNTER — Other Ambulatory Visit: Payer: Self-pay | Admitting: *Deleted

## 2024-03-22 DIAGNOSIS — O3663X0 Maternal care for excessive fetal growth, third trimester, not applicable or unspecified: Secondary | ICD-10-CM

## 2024-03-22 DIAGNOSIS — O24410 Gestational diabetes mellitus in pregnancy, diet controlled: Secondary | ICD-10-CM

## 2024-03-22 LAB — GC/CHLAMYDIA PROBE AMP (~~LOC~~) NOT AT ARMC
Chlamydia: NEGATIVE
Comment: NEGATIVE
Comment: NORMAL
Neisseria Gonorrhea: NEGATIVE

## 2024-03-23 ENCOUNTER — Ambulatory Visit: Admitting: Obstetrics and Gynecology

## 2024-03-23 ENCOUNTER — Ambulatory Visit (INDEPENDENT_AMBULATORY_CARE_PROVIDER_SITE_OTHER)

## 2024-03-23 ENCOUNTER — Encounter: Payer: Self-pay | Admitting: Obstetrics and Gynecology

## 2024-03-23 ENCOUNTER — Other Ambulatory Visit: Payer: Self-pay

## 2024-03-23 VITALS — BP 139/85 | HR 119 | Wt 355.0 lb

## 2024-03-23 DIAGNOSIS — Z98891 History of uterine scar from previous surgery: Secondary | ICD-10-CM

## 2024-03-23 DIAGNOSIS — O0993 Supervision of high risk pregnancy, unspecified, third trimester: Secondary | ICD-10-CM | POA: Diagnosis not present

## 2024-03-23 DIAGNOSIS — O0990 Supervision of high risk pregnancy, unspecified, unspecified trimester: Secondary | ICD-10-CM

## 2024-03-23 DIAGNOSIS — O24410 Gestational diabetes mellitus in pregnancy, diet controlled: Secondary | ICD-10-CM

## 2024-03-23 DIAGNOSIS — O24415 Gestational diabetes mellitus in pregnancy, controlled by oral hypoglycemic drugs: Secondary | ICD-10-CM | POA: Diagnosis not present

## 2024-03-23 DIAGNOSIS — O3663X0 Maternal care for excessive fetal growth, third trimester, not applicable or unspecified: Secondary | ICD-10-CM

## 2024-03-23 DIAGNOSIS — Z3A33 33 weeks gestation of pregnancy: Secondary | ICD-10-CM | POA: Diagnosis not present

## 2024-03-23 DIAGNOSIS — O09893 Supervision of other high risk pregnancies, third trimester: Secondary | ICD-10-CM

## 2024-03-23 DIAGNOSIS — O10013 Pre-existing essential hypertension complicating pregnancy, third trimester: Secondary | ICD-10-CM

## 2024-03-23 MED ORDER — METFORMIN HCL 500 MG PO TABS: 1000.0000 mg | ORAL_TABLET | Freq: Two times a day (BID) | ORAL | 5 refills | Status: DC

## 2024-03-23 NOTE — Progress Notes (Unsigned)
   PRENATAL VISIT NOTE  Subjective:  Teresa [name] is a 33 y.o. H5E7987 at 108w1d being seen today for ongoing prenatal care.  She is currently monitored for the following issues for this high-risk pregnancy and has Supervision of high risk pregnancy, antepartum; Pre-existing essential hypertension affecting pregnancy in third trimester; PCOS (polycystic ovarian syndrome); History of cesarean delivery; History of gestational diabetes; Gestational diabetes, diet controlled; Large for gestational age fetus affecting mother, antepartum, third trimester, single gestation; and Short interval between pregnancies affecting pregnancy in third trimester, antepartum on their problem list.  Patient reports {sx:14538}.  Contractions: Not present. Vag. Bleeding: None.  Movement: Present. Denies leaking of fluid.   The following portions of the patient's history were reviewed and updated as appropriate: allergies, current medications, past family history, past medical history, past social history, past surgical history and problem list.   Objective:   Vitals:   03/23/24 1522  BP: 139/85  Pulse: (!) 119  Weight: (!) 355 lb (161 kg)    Fetal Status:  Fetal Heart Rate (bpm): 141   Movement: Present    General: Alert, oriented and cooperative. Patient is in no acute distress.  Skin: Skin is warm and dry. No rash noted.   Cardiovascular: Normal heart rate noted  Respiratory: Normal respiratory effort, no problems with respiration noted  Abdomen: Soft, gravid, appropriate for gestational age.  Pain/Pressure: Absent     Pelvic: Cervical exam deferred        Extremities: Normal range of motion.  Edema: None  Mental Status: Normal mood and affect. Normal behavior. Normal judgment and thought content.   Assessment and Plan:  Pregnancy: H5E7987 at [redacted]w[redacted]d 1. Supervision of high risk pregnancy, antepartum ***  2. [redacted] weeks gestation of pregnancy (Primary) ***  3. Diet controlled gestational diabetes  mellitus (GDM) in third trimester Elevated fasting and pp,   4. History of cesarean delivery Desires TOLAC, consented previously   5. Large for gestational age fetus affecting mother, antepartum, third trimester, single gestation ***  6. Pre-existing essential hypertension affecting pregnancy in third trimester ***  7. Short interval between pregnancies affecting pregnancy in third trimester, antepartum ***   {Blank single:19197::Term,Preterm} labor symptoms and general obstetric precautions including but not limited to vaginal bleeding, contractions, leaking of fluid and fetal movement were reviewed in detail with the patient. Please refer to After Visit Summary for other counseling recommendations.   No follow-ups on file.  Future Appointments  Date Time Provider Department Center  04/02/2024 10:45 AM [facility] NST [facility]  04/06/2024  9:55 AM Nicholaus Burnard HERO, MD [facility]  04/09/2024  9:15 AM [facility] PROVIDER 1 [facility]  04/09/2024  9:30 AM [facility] US1 [facility]  04/12/2024 10:55 AM Nicholaus Burnard HERO, MD [facility]  04/16/2024  9:15 AM [facility] PROVIDER 1 [facility]  04/16/2024  9:30 AM [facility] US1 [facility]  04/16/2024 10:45 AM [facility] NST [facility]  04/19/2024 10:15 AM Nicholaus Burnard HERO, MD [facility]  04/27/2024  1:15 PM Zina Jerilynn LABOR, MD [facility]  05/04/2024  8:15 AM Izell Harari, MD [facility]  05/10/2024  1:55 PM Eveline Lynwood MATSU, MD [facility]    Nidia Daring, FNP

## 2024-03-24 ENCOUNTER — Inpatient Hospital Stay (HOSPITAL_COMMUNITY)
Admission: AD | Admit: 2024-03-24 | Discharge: 2024-03-24 | Disposition: A | Discharge status: Home / Self Care | Payer: Self-pay | Attending: Obstetrics and Gynecology | Admitting: Obstetrics and Gynecology

## 2024-03-24 ENCOUNTER — Encounter (HOSPITAL_COMMUNITY): Payer: Self-pay | Admitting: Obstetrics and Gynecology

## 2024-03-24 DIAGNOSIS — W010XXA Fall on same level from slipping, tripping and stumbling without subsequent striking against object, initial encounter: Secondary | ICD-10-CM | POA: Diagnosis not present

## 2024-03-24 DIAGNOSIS — O10013 Pre-existing essential hypertension complicating pregnancy, third trimester: Secondary | ICD-10-CM | POA: Insufficient documentation

## 2024-03-24 DIAGNOSIS — O24415 Gestational diabetes mellitus in pregnancy, controlled by oral hypoglycemic drugs: Secondary | ICD-10-CM | POA: Diagnosis not present

## 2024-03-24 DIAGNOSIS — O99283 Endocrine, nutritional and metabolic diseases complicating pregnancy, third trimester: Secondary | ICD-10-CM | POA: Diagnosis not present

## 2024-03-24 DIAGNOSIS — Z3493 Encounter for supervision of normal pregnancy, unspecified, third trimester: Secondary | ICD-10-CM

## 2024-03-24 DIAGNOSIS — Z3A33 33 weeks gestation of pregnancy: Secondary | ICD-10-CM

## 2024-03-24 DIAGNOSIS — O0990 Supervision of high risk pregnancy, unspecified, unspecified trimester: Secondary | ICD-10-CM

## 2024-03-24 DIAGNOSIS — E282 Polycystic ovarian syndrome: Secondary | ICD-10-CM | POA: Insufficient documentation

## 2024-03-24 DIAGNOSIS — R109 Unspecified abdominal pain: Secondary | ICD-10-CM | POA: Diagnosis present

## 2024-03-24 DIAGNOSIS — Z3689 Encounter for other specified antenatal screening: Secondary | ICD-10-CM

## 2024-03-24 MED ORDER — CYCLOBENZAPRINE HCL 10 MG PO TABS: 10.0000 mg | ORAL_TABLET | Freq: Two times a day (BID) | ORAL | 2 refills | Status: DC | PRN

## 2024-03-24 NOTE — Discharge Instructions (Signed)

## 2024-03-24 NOTE — MAU Note (Signed)
 Pt got up to BR. Does not want to put monitor back until she talks to provider

## 2024-03-24 NOTE — MAU Provider Note (Addendum)
 Chief Complaint:  Fall   HPI   None     Teresa [name] is a 33 y.o. G3P2002 at [redacted]w[redacted]d who presents to maternity admissions reporting a trip and fall. She reports tripping over a basket at home and falling at 1500. She landed on her bottom but did not hit her abdomen. She has vaginal pressure. Denies vaginal bleeding, leaking of fluid, contractions. Endorses good fetal movement. She also notes ongoing chest tightness for the past couple of weeks, discussed at her last OB office visit.  Pregnancy Course: Receives care at [facility] for [facility] . Prenatal records reviewed. Pregnancy complicated by cHTN, gDM, PCOS, LGA fetus, short interval between pregnancies.  Past Medical History:  Diagnosis Date   Abnormal Pap smear    Acid reflux    ADHD (attention deficit hyperactivity disorder)    Asthma    childhood /grew out of it   Breast mass, right 01/11/2014   Negative breast imaging. Radiologist at [facility] states cannot see or feel mass.      Chlamydia    Chronic cystitis 07/02/2018   Degenerative disk disease    Depression    post partem   Dysmenorrhea 04/15/2012   Fractured fibula    left   History of PCOS    Migraine    Syphilis    2023   Trichomonas    Urinary tract infection    Vaginal Pap smear, abnormal    normal since   OB History  Gravida Para Term Preterm AB Living  3 2 2  0 0 2  SAB IAB Ectopic Multiple Live Births  0 0 0 0 2    # Outcome Date GA Lbr Len/2nd Weight Sex Type Anes PTL Lv  3 Current           2 Term 03/05/23 [redacted]w[redacted]d  3033 g F CS-Unspec Spinal  LIV     Complications: Failure to progress in labor  1 Term 11/27/11 [redacted]w[redacted]d 702:35 / 01:55 3790 g F Vag-Spont EPI  LIV   Past Surgical History:  Procedure Laterality Date   CESAREAN SECTION     DILATION AND CURETTAGE OF UTERUS     INDUCED ABORTION     TONSILLECTOMY AND ADENOIDECTOMY N/A 08/02/2020   Procedure: TONSILLECTOMY AND ADENOIDECTOMY;  Surgeon: Karis Clunes, MD;   Location: [facility] OR;  Service: ENT;  Laterality: N/A;   VAGINAL DELIVERY  2013   WISDOM TOOTH EXTRACTION     Family History  Problem Relation Age of Onset   Diabetes Mother    Hypertension Mother    Heart disease Mother    Diabetes Maternal Grandfather    Heart disease Maternal Grandmother        chf   Anesthesia problems Neg Hx    Hypotension Neg Hx    Malignant hyperthermia Neg Hx    Pseudochol deficiency Neg Hx    Social History   Tobacco Use   Smoking status: Former    Current packs/day: 0.50    Average packs/day: 0.5 packs/day for 7.0 years (3.5 ttl pk-yrs)    Types: Cigarettes   Smokeless tobacco: Never  Vaping Use   Vaping status: Never Used  Substance Use Topics   Alcohol use: Not Currently    Alcohol/week: 1.0 standard drink of alcohol    Types: 1 Shots of liquor per week    Comment: occ but was a heavy user in 2015 due to boyfriend passed away.   Drug use: Not Currently  Types: Marijuana    Comment: last 10/15/23   Allergies  Allergen Reactions   Other Anaphylaxis    Grapes, strawberries, bananas, pineapple, oranges, nuts   Peanut (Diagnostic) Anaphylaxis   Pineapple Anaphylaxis and Swelling   Betadine [Povidone Iodine] Itching    Betadine causes itching and skin redness and irritation   Povidone-Iodine Itching, Rash and Dermatitis    Betadine causes itching and skin redness and irritation   Pollen Extract Itching   Sulfa  Antibiotics Itching   Bee Pollen Itching   Iodine Itching   Latex Itching   Tomato Itching and Rash   Tramadol  Nausea And Vomiting and Nausea Only   Medications Prior to Admission  Medication Sig Dispense Refill Last Dose/Taking   Accu-Chek Softclix Lancets lancets 120 each by Other route 4 (four) times daily. Use as instructed 100 each 12 03/24/2024   acetaminophen  (TYLENOL ) 500 MG tablet Take 2 tablets (1,000 mg total) by mouth every 6 (six) hours as needed.   03/24/2024   aspirin  EC 81 MG tablet Take 2 tablets (162 mg total) by  mouth daily. Start taking when you are [redacted] weeks pregnant for rest of pregnancy for prevention of preeclampsia 300 tablet 2 03/24/2024   Blood Glucose Monitoring Suppl DEVI May substitute to any manufacturer covered by patient's insurance. 1 Units 0 03/24/2024   cefadroxil  (DURICEF) 500 MG capsule Take 1 capsule (500 mg total) by mouth 2 (two) times daily for 10 days. 20 capsule 0 03/24/2024   cyclobenzaprine  (FLEXERIL ) 10 MG tablet Take 1 tablet (10 mg total) by mouth 2 (two) times daily as needed for muscle spasms. 20 tablet 0 03/24/2024   glucose blood test strip Use as instructed 100 each 12 03/24/2024   metFORMIN  (GLUCOPHAGE ) 500 MG tablet Take 2 tablets (1,000 mg total) by mouth 2 (two) times daily with a meal. 60 tablet 5 03/24/2024   NIFEdipine  (ADALAT  CC) 30 MG 24 hr tablet Take 1 tablet (30 mg total) by mouth daily. 90 tablet 2 03/24/2024   nystatin -triamcinolone  (MYCOLOG II) cream Apply to affected area TID prn 30 g 0 03/24/2024   Prenatal Vit-Fe Fumarate-FA (PRENATAL COMPLETE) 14-0.4 MG TABS Take 1 tablet by mouth daily. 60 tablet 6 03/24/2024    I have reviewed patient's Past Medical Hx, Surgical Hx, Family Hx, Social Hx, medications and allergies.   ROS  Pertinent items noted in HPI and remainder of comprehensive ROS otherwise negative.   PHYSICAL EXAM  Patient Vitals for the past 24 hrs:  BP Temp Temp src Pulse Resp SpO2 Height Weight  03/24/24 1731 117/64 -- -- (!) 124 -- -- -- --  03/24/24 1712 138/74 98.4 F (36.9 C) Oral (!) 125 18 97 % 5' 2 (1.575 m) (!) 160.9 kg    Constitutional: Well-developed, well-nourished female in no acute distress.  HEENT: atraumatic, normocephalic. Neck has normal ROM. EOM intact. Cardiovascular: normal rhythm, warm and well-perfused Respiratory: normal effort, no problems with respiration noted GI: Gravid, nontender MSK: Extremities nontender, no edema, normal ROM Skin: warm and dry. Acyanotic, no jaundice or pallor. Neurologic: Alert and  oriented x 4. No abnormal coordination. Psychiatric: Normal mood. Speech not slurred, not rapid/pressured. Patient is cooperative.    Fetal Tracing: Baseline FHR: 140 per minute Fetal heart variability: moderate Fetal Heart Rate accelerations: yes Fetal Heart Rate decelerations: none Fetal Non-stress Test: Category I (reactive) Toco: no uterine contractions   Labs: No results found for this or any previous visit (from the past 24 hours).  Imaging:  No results  found.  MDM & MAU COURSE  MDM: Moderate  MAU Course: -Vital signs within normal limits. -Reactive NST. No contractions. -No direct abdominal trauma, no abdominal pain, good fetal movement, and reactive NST. Patient landed on her bottom and fell from standing. It has also been 4 hours since the fall with no vaginal bleeding, abdominal pain.  Orders Placed This Encounter  Procedures   ED EKG   EKG 12-Lead   Discharge patient   No orders of the defined types were placed in this encounter.   ASSESSMENT   1. NST (non-stress test) reactive   2. Movement of fetus present during pregnancy in third trimester   3. [redacted] weeks gestation of pregnancy     PLAN  Discharge home in stable condition with return precautions.  Tylenol  prn for tailbone pain.     Allergies as of 03/24/2024       Reactions   Other Anaphylaxis   Grapes, strawberries, bananas, pineapple, oranges, nuts   Peanut (diagnostic) Anaphylaxis   Pineapple Anaphylaxis, Swelling   Betadine [povidone Iodine] Itching   Betadine causes itching and skin redness and irritation   Povidone-iodine Itching, Rash, Dermatitis   Betadine causes itching and skin redness and irritation   Pollen Extract Itching   Sulfa  Antibiotics Itching   Bee Pollen Itching   Iodine Itching   Latex Itching   Tomato Itching, Rash   Tramadol  Nausea And Vomiting, Nausea Only        Medication List     TAKE these medications    Accu-Chek Softclix Lancets lancets 120 each by  Other route 4 (four) times daily. Use as instructed   acetaminophen  500 MG tablet Commonly known as: TYLENOL  Take 2 tablets (1,000 mg total) by mouth every 6 (six) hours as needed.   aspirin  EC 81 MG tablet Take 2 tablets (162 mg total) by mouth daily. Start taking when you are [redacted] weeks pregnant for rest of pregnancy for prevention of preeclampsia   Blood Glucose Monitoring Suppl Devi May substitute to any manufacturer covered by patient's insurance.   cefadroxil  500 MG capsule Commonly known as: DURICEF Take 1 capsule (500 mg total) by mouth 2 (two) times daily for 10 days.   cyclobenzaprine  10 MG tablet Commonly known as: FLEXERIL  Take 1 tablet (10 mg total) by mouth 2 (two) times daily as needed for muscle spasms.   glucose blood test strip Use as instructed   metFORMIN  500 MG tablet Commonly known as: GLUCOPHAGE  Take 2 tablets (1,000 mg total) by mouth 2 (two) times daily with a meal.   NIFEdipine  30 MG 24 hr tablet Commonly known as: ADALAT  CC Take 1 tablet (30 mg total) by mouth daily.   nystatin -triamcinolone  cream Commonly known as: MYCOLOG II Apply to affected area TID prn   Prenatal Complete 14-0.4 MG Tabs Take 1 tablet by mouth daily.        Joesph DELENA Sear, PA

## 2024-03-24 NOTE — MAU Note (Signed)
 Teresa [name] is a 33 y.o. at [redacted]w[redacted]d here in MAU reporting: tripping over a basket at home and landed on her bottom.  Pt denies hitting belly. Pt reports pressure in vaginal area.  Pt states that she has chest tightening but has been happening since last week.    LMP: 07/17/23  Onset of complaint: 1500 Pain score: 10 Vitals:   03/24/24 1712  BP: 138/74  Pulse: (!) 125  Resp: 18  Temp: 98.4 F (36.9 C)  SpO2: 97%     FHT:   Lab orders placed from triage: UA

## 2024-03-30 ENCOUNTER — Ambulatory Visit

## 2024-03-30 ENCOUNTER — Other Ambulatory Visit

## 2024-03-31 ENCOUNTER — Telehealth: Payer: Self-pay | Admitting: Family Medicine

## 2024-03-31 NOTE — Telephone Encounter (Signed)
 Returned call to pt and advised of safe OTC  medications in pregnancy for cold. List has been sent to her [employer] account.  Pt voiced understanding.

## 2024-03-31 NOTE — Telephone Encounter (Signed)
 Patient has a cold, and wants to know what she can take OTC.

## 2024-04-02 ENCOUNTER — Ambulatory Visit: Attending: Maternal & Fetal Medicine

## 2024-04-02 DIAGNOSIS — Z3A34 34 weeks gestation of pregnancy: Secondary | ICD-10-CM

## 2024-04-02 DIAGNOSIS — O24410 Gestational diabetes mellitus in pregnancy, diet controlled: Secondary | ICD-10-CM

## 2024-04-02 DIAGNOSIS — O3663X0 Maternal care for excessive fetal growth, third trimester, not applicable or unspecified: Secondary | ICD-10-CM

## 2024-04-02 DIAGNOSIS — O10013 Pre-existing essential hypertension complicating pregnancy, third trimester: Secondary | ICD-10-CM

## 2024-04-02 NOTE — Procedures (Deleted)
 No note

## 2024-04-02 NOTE — Procedures (Signed)
 Teresa [name] [birth year] [redacted]w[redacted]d  Fetus A Non-Stress Test Interpretation for 04/02/24  Indication: Chronic Hypertension, Diabetes, and LGA - NST only  Fetal Heart Rate A Mode: External Baseline Rate (A): 140 bpm Variability: Moderate Accelerations: 15 x 15 Decelerations: None Multiple birth?: No  Uterine Activity Mode: Palpation, Toco Contraction Frequency (min): none noted Resting Tone Palpated: Relaxed  Interpretation (Fetal Testing) Nonstress Test Interpretation: Reactive Comments: Reviewed with Dr. William

## 2024-04-06 ENCOUNTER — Other Ambulatory Visit: Payer: Self-pay

## 2024-04-06 ENCOUNTER — Encounter (HOSPITAL_COMMUNITY): Payer: Self-pay | Admitting: *Deleted

## 2024-04-06 ENCOUNTER — Ambulatory Visit: Admitting: Obstetrics and Gynecology

## 2024-04-06 ENCOUNTER — Telehealth (HOSPITAL_COMMUNITY): Payer: Self-pay | Admitting: *Deleted

## 2024-04-06 VITALS — BP 129/80 | HR 111 | Wt 353.8 lb

## 2024-04-06 DIAGNOSIS — Z98891 History of uterine scar from previous surgery: Secondary | ICD-10-CM

## 2024-04-06 DIAGNOSIS — O09893 Supervision of other high risk pregnancies, third trimester: Secondary | ICD-10-CM

## 2024-04-06 DIAGNOSIS — Z3A35 35 weeks gestation of pregnancy: Secondary | ICD-10-CM

## 2024-04-06 DIAGNOSIS — O0990 Supervision of high risk pregnancy, unspecified, unspecified trimester: Secondary | ICD-10-CM

## 2024-04-06 DIAGNOSIS — O3663X0 Maternal care for excessive fetal growth, third trimester, not applicable or unspecified: Secondary | ICD-10-CM

## 2024-04-06 DIAGNOSIS — O10013 Pre-existing essential hypertension complicating pregnancy, third trimester: Secondary | ICD-10-CM

## 2024-04-06 DIAGNOSIS — O24410 Gestational diabetes mellitus in pregnancy, diet controlled: Secondary | ICD-10-CM

## 2024-04-06 DIAGNOSIS — Z8632 Personal history of gestational diabetes: Secondary | ICD-10-CM

## 2024-04-06 NOTE — Telephone Encounter (Signed)
 Preadmission screen

## 2024-04-06 NOTE — Progress Notes (Signed)
 PRENATAL VISIT NOTE  Subjective:  Teresa [name] is a 33 y.o. G3P2002 at [redacted]w[redacted]d being seen today for ongoing prenatal care.  She is currently monitored for the following issues for this high-risk pregnancy and has Supervision of high risk pregnancy, antepartum; Pre-existing essential hypertension affecting pregnancy in third trimester; PCOS (polycystic ovarian syndrome); History of cesarean delivery; History of gestational diabetes; Gestational diabetes, diet controlled; Large for gestational age fetus affecting mother, antepartum, third trimester, single gestation; and Short interval between pregnancies affecting pregnancy in third trimester, antepartum on their problem list.  Patient reports ready for delivery, still would like to [facility].  Contractions: Irritability. Vag. Bleeding: None.  Movement: Present. Denies leaking of fluid.   The following portions of the patient's history were reviewed and updated as appropriate: allergies, current medications, past family history, past medical history, past social history, past surgical history and problem list.   Objective:   Vitals:   04/06/24 1009  BP: 129/80  Pulse: (!) 111  Weight: (!) 353 lb 12.8 oz (160.5 kg)    Fetal Status:  Fetal Heart Rate (bpm): 145   Movement: Present    General: Alert, oriented and cooperative. Patient is in no acute distress.  Skin: Skin is warm and dry. No rash noted.   Cardiovascular: Normal heart rate noted  Respiratory: Normal respiratory effort, no problems with respiration noted  Abdomen: Soft, gravid, appropriate for gestational age.  Pain/Pressure: Present     Pelvic: Cervical exam deferred        Extremities: Normal range of motion.  Edema: Mild pitting, slight indentation  Mental Status: Normal mood and affect. Normal behavior. Normal judgment and thought content.      10/27/2023   10:33 AM 07/02/2018    5:21 PM  Depression screen PHQ 2/9  Decreased Interest 0 3  Down, Depressed, Hopeless 0 0   PHQ - 2 Score 0 3  Altered sleeping 0 3  Tired, decreased energy 0 3  Change in appetite 0 3  Feeling bad or failure about yourself  0 1  Trouble concentrating 0 0  Moving slowly or fidgety/restless 0 0  Suicidal thoughts 0 0  PHQ-9 Score 0  13      Data saved with a previous flowsheet row definition        10/27/2023   10:35 AM 07/02/2018    5:22 PM  GAD 7 : Generalized Anxiety Score  Nervous, Anxious, on Edge 0 3  Control/stop worrying 0 3  Worry too much - different things 0 3  Trouble relaxing 0 3  Restless 0 3  Easily annoyed or irritable 0 3  Afraid - awful might happen 0 1  Total GAD 7 Score 0 19    Assessment and Plan:  Pregnancy: G3P2002 at [redacted]w[redacted]d  1. Supervision of high risk pregnancy, antepartum (Primary) Antibody screen needed, will get Friday per patient request Counseled regarding risks/benefits of Tdap vaccine, patient accepts vaccine.   2. Diet controlled gestational diabetes mellitus (GDM) in third trimester On metformin  1000 mg BID Reports fasting are mostly within range, she is compliant with checking CBGs, does not have log  3. History of cesarean delivery Desires TOLAC, consent previously signed  4. History of gestational diabetes  5. Large for gestational age fetus affecting mother, antepartum, third trimester, single gestation Last growth 97%tile on 03/05/24  6. Short interval between pregnancies affecting pregnancy in third trimester, antepartum  7. Pre-existing essential hypertension affecting pregnancy in third trimester Procardia  30 mg daily Per  MFM, rec delivery at 37 weeks IOL scheduled for 37 weeks  8. [redacted] weeks gestation of pregnancy   Preterm labor symptoms and general obstetric precautions including but not limited to vaginal bleeding, contractions, leaking of fluid and fetal movement were reviewed in detail with the patient. Please refer to After Visit Summary for other counseling recommendations.   Return in about 1 week  (around 04/13/2024) for high OB.  Future Appointments  Date Time Provider Department Center  04/09/2024  9:15 AM [facility] PROVIDER 1 [facility]  04/09/2024  9:30 AM [facility] US1 [facility]  04/09/2024 11:00 AM [facility] LAB [facility]  04/12/2024 10:55 AM Nicholaus Burnard HERO, MD [facility]  04/16/2024  9:15 AM [facility] PROVIDER 1 [facility]  04/16/2024  9:30 AM [facility] US1 [facility]  04/16/2024 10:45 AM [facility] NST [facility]  04/19/2024  7:00 AM [facility]-LD SCHED ROOM [facility]-INDC None  04/19/2024 10:15 AM Nicholaus Burnard HERO, MD [facility]  04/27/2024  1:15 PM Zina Jerilynn LABOR, MD [facility]  05/04/2024  8:15 AM Izell Harari, MD [facility]  05/10/2024  1:55 PM Eveline Lynwood MATSU, MD [facility]    Burnard HERO Nicholaus, MD

## 2024-04-09 ENCOUNTER — Other Ambulatory Visit

## 2024-04-09 ENCOUNTER — Ambulatory Visit

## 2024-04-09 ENCOUNTER — Other Ambulatory Visit: Payer: Self-pay | Admitting: Maternal & Fetal Medicine

## 2024-04-09 ENCOUNTER — Ambulatory Visit: Attending: Obstetrics

## 2024-04-09 VITALS — BP 134/78 | HR 102

## 2024-04-09 DIAGNOSIS — Z3A34 34 weeks gestation of pregnancy: Secondary | ICD-10-CM

## 2024-04-09 DIAGNOSIS — O0990 Supervision of high risk pregnancy, unspecified, unspecified trimester: Secondary | ICD-10-CM

## 2024-04-09 DIAGNOSIS — O10913 Unspecified pre-existing hypertension complicating pregnancy, third trimester: Secondary | ICD-10-CM

## 2024-04-09 DIAGNOSIS — Z98891 History of uterine scar from previous surgery: Secondary | ICD-10-CM

## 2024-04-09 DIAGNOSIS — O24410 Gestational diabetes mellitus in pregnancy, diet controlled: Secondary | ICD-10-CM

## 2024-04-09 DIAGNOSIS — O3663X0 Maternal care for excessive fetal growth, third trimester, not applicable or unspecified: Secondary | ICD-10-CM

## 2024-04-09 DIAGNOSIS — O99213 Obesity complicating pregnancy, third trimester: Secondary | ICD-10-CM | POA: Diagnosis not present

## 2024-04-09 DIAGNOSIS — Z3A35 35 weeks gestation of pregnancy: Secondary | ICD-10-CM

## 2024-04-09 DIAGNOSIS — O3660X0 Maternal care for excessive fetal growth, unspecified trimester, not applicable or unspecified: Secondary | ICD-10-CM

## 2024-04-09 NOTE — Progress Notes (Signed)
 MFM Consult Note  Teresa [name] is currently at [redacted]w[redacted]d. She has been followed due to maternal obesity with a MI of 36, diet-controlled gestational diabetes, and chronic hypertension treated with nifedipine .    She denies any problems since her last exam and reports that her fingerstick values have mostly been within normal limits.    Her blood pressure today was 134/78.  Sonographic findings Single intrauterine pregnancy at 35w 4d. Fetal cardiac activity: Observed. Presentation: Cephalic. Fetal biometry shows the estimated fetal weight of 8 lb 5 oz,  3783g (> 99%). Amniotic fluid: Within normal limits. AFI: 16.9 cm.  MVP: 5.75 cm. Placenta: Posterior. BPP: 8/8.   Due to her underlying medical conditions, she already has an induction scheduled on April 19, 2024 (in 10 days) at 37 weeks.  The patient had a vaginal delivery with her first child who weighed 8 pounds 6 ounces.  She had a C-section for the delivery of her second child due to fetal malpresentation.    She was advised that this child will most likely weigh slightly heavier as compared to her first child when she delivers at 37 weeks.  She was advised to continue to monitor her fingerstick values at home.    She will return in 1 week for an NST.    She should continue with the plan for induction at 37 weeks.  The patient stated that all of her questions were answered.   A total of 20 minutes was spent counseling and coordinating the care for this patient.  Greater than 50% of the time was spent in direct face-to-face contact.

## 2024-04-10 LAB — ANTIBODY SCREEN: Antibody Screen: NEGATIVE

## 2024-04-12 ENCOUNTER — Ambulatory Visit: Admitting: Obstetrics and Gynecology

## 2024-04-12 ENCOUNTER — Other Ambulatory Visit: Payer: Self-pay

## 2024-04-12 ENCOUNTER — Other Ambulatory Visit (HOSPITAL_COMMUNITY)
Admission: RE | Admit: 2024-04-12 | Discharge: 2024-04-12 | Disposition: A | Source: Ambulatory Visit | Attending: Obstetrics and Gynecology | Admitting: Obstetrics and Gynecology

## 2024-04-12 VITALS — BP 124/81 | HR 130 | Wt 357.0 lb

## 2024-04-12 DIAGNOSIS — O3663X0 Maternal care for excessive fetal growth, third trimester, not applicable or unspecified: Secondary | ICD-10-CM

## 2024-04-12 DIAGNOSIS — O10013 Pre-existing essential hypertension complicating pregnancy, third trimester: Secondary | ICD-10-CM

## 2024-04-12 DIAGNOSIS — O0993 Supervision of high risk pregnancy, unspecified, third trimester: Secondary | ICD-10-CM | POA: Diagnosis not present

## 2024-04-12 DIAGNOSIS — Z8632 Personal history of gestational diabetes: Secondary | ICD-10-CM

## 2024-04-12 DIAGNOSIS — O0990 Supervision of high risk pregnancy, unspecified, unspecified trimester: Secondary | ICD-10-CM | POA: Diagnosis present

## 2024-04-12 DIAGNOSIS — Z98891 History of uterine scar from previous surgery: Secondary | ICD-10-CM

## 2024-04-12 DIAGNOSIS — O09893 Supervision of other high risk pregnancies, third trimester: Secondary | ICD-10-CM

## 2024-04-12 DIAGNOSIS — Z3A36 36 weeks gestation of pregnancy: Secondary | ICD-10-CM | POA: Diagnosis not present

## 2024-04-12 DIAGNOSIS — O24410 Gestational diabetes mellitus in pregnancy, diet controlled: Secondary | ICD-10-CM

## 2024-04-12 NOTE — Progress Notes (Unsigned)
 PRENATAL VISIT NOTE  Subjective:  Teresa [name] is a 33 y.o. G3P2002 at [redacted]w[redacted]d being seen today for ongoing prenatal care.  She is currently monitored for the following issues for this high-risk pregnancy and has Supervision of high risk pregnancy, antepartum; Pre-existing essential hypertension affecting pregnancy in third trimester; PCOS (polycystic ovarian syndrome); History of cesarean delivery; History of gestational diabetes; Gestational diabetes, diet controlled; Large for gestational age fetus affecting mother, antepartum, third trimester, single gestation; and Short interval between pregnancies affecting pregnancy in third trimester, antepartum on their problem list.  Patient reports tired, cramping, occasional contractions.  Contractions: Irregular. Vag. Bleeding: None.  Movement: Present. Denies leaking of fluid.   The following portions of the patient's history were reviewed and updated as appropriate: allergies, current medications, past family history, past medical history, past social history, past surgical history and problem list.   Objective:   Vitals:   04/12/24 1135 04/12/24 1218  BP: (!) 155/83 124/81  Pulse: (!) 132 (!) 130  Weight: (!) 357 lb (161.9 kg)     Fetal Status:  Fetal Heart Rate (bpm): 156   Movement: Present    General: Alert, oriented and cooperative. Patient is in no acute distress.  Skin: Skin is warm and dry. No rash noted.   Cardiovascular: Normal heart rate noted  Respiratory: Normal respiratory effort, no problems with respiration noted  Abdomen: Soft, gravid, appropriate for gestational age.  Pain/Pressure: Present     Pelvic: Cervical exam performed in the presence of a chaperone Dilation: Closed Effacement (%): 50 Station: 0 soft, anterior  Extremities: Normal range of motion.  Edema: Mild pitting, slight indentation  Mental Status: Normal mood and affect. Normal behavior. Normal judgment and thought content.      10/27/2023   10:33 AM  07/02/2018    5:21 PM  Depression screen PHQ 2/9  Decreased Interest 0 3  Down, Depressed, Hopeless 0 0  PHQ - 2 Score 0 3  Altered sleeping 0 3  Tired, decreased energy 0 3  Change in appetite 0 3  Feeling bad or failure about yourself  0 1  Trouble concentrating 0 0  Moving slowly or fidgety/restless 0 0  Suicidal thoughts 0 0  PHQ-9 Score 0  13      Data saved with a previous flowsheet row definition        10/27/2023   10:35 AM 07/02/2018    5:22 PM  GAD 7 : Generalized Anxiety Score  Nervous, Anxious, on Edge 0 3  Control/stop worrying 0 3  Worry too much - different things 0 3  Trouble relaxing 0 3  Restless 0 3  Easily annoyed or irritable 0 3  Afraid - awful might happen 0 1  Total GAD 7 Score 0 19    Assessment and Plan:  Pregnancy: G3P2002 at [redacted]w[redacted]d  1. Pre-existing essential hypertension affecting pregnancy in third trimester (Primary) On procardia  30 mg XL BP elevated today on first take, normal when retaken after sitting for 15 min Reports she is compliant with procardia  and took it this am Will get labs today  2. Diet controlled gestational diabetes mellitus (GDM) in third trimester Reports CBGs are well controlled on metformin  1000 mg BID Does not have log Has IOL for 37 weeks scheduled  3. Supervision of high risk pregnancy, antepartum Desires to get IUD at time of delivery  4. History of cesarean delivery Plans for TOLAC (h/o SVD, then CS for failed induction @ 38 weeks) Reviewed EFW  for baby  at 37 weeks, current EFW 3783 gms at >99%tile Reviewed recommendation for RCS if EFW> 4500 gms, but at current size, okay to proceed with TOLAC per MFM Reviewed possible of shoulder dystocia, risks/benefits of vaginal vs c-section Will proceed with TOLAC  5. History of gestational diabetes  6. Large for gestational age fetus affecting mother, antepartum, third trimester, single gestation See above  7. Short interval between pregnancies affecting pregnancy  in third trimester, antepartum  Preterm labor symptoms and general obstetric precautions including but not limited to vaginal bleeding, contractions, leaking of fluid and fetal movement were reviewed in detail with the patient. Please refer to After Visit Summary for other counseling recommendations.   Return in about 5 weeks (around 05/17/2024) for post partum check.  Future Appointments  Date Time Provider Department Center  04/16/2024 10:45 AM [facility] NST [facility]  04/19/2024  7:00 AM [facility] SCHED ROOM [facility] None  05/17/2024  3:15 PM Cresenzo, Norleen GAILS, MD [facility]    Burnard CHRISTELLA Moats, MD

## 2024-04-13 ENCOUNTER — Ambulatory Visit: Payer: Self-pay | Admitting: Obstetrics and Gynecology

## 2024-04-13 LAB — COMPREHENSIVE METABOLIC PANEL WITH GFR
ALT: 31 IU/L (ref 0–32)
AST: 33 IU/L (ref 0–40)
Albumin: 3.6 g/dL — ABNORMAL LOW (ref 3.9–4.9)
Alkaline Phosphatase: 112 IU/L (ref 41–116)
BUN/Creatinine Ratio: 10 (ref 9–23)
BUN: 6 mg/dL (ref 6–20)
Bilirubin Total: <0.2 mg/dL (ref 0.0–1.2)
CO2: 20 mmol/L (ref 20–29)
Calcium: 9.9 mg/dL (ref 8.7–10.2)
Chloride: 99 mmol/L (ref 96–106)
Creatinine, Ser: 0.6 mg/dL (ref 0.57–1.00)
Globulin, Total: 3.2 g/dL (ref 1.5–4.5)
Glucose: 141 mg/dL — ABNORMAL HIGH (ref 70–99)
Potassium: 4.8 mmol/L (ref 3.5–5.2)
Sodium: 135 mmol/L (ref 134–144)
Total Protein: 6.8 g/dL (ref 6.0–8.5)
eGFR: 122 mL/min/1.73 (ref >59)

## 2024-04-13 LAB — CERVICOVAGINAL ANCILLARY ONLY
Chlamydia: NEGATIVE
Comment: NEGATIVE
Comment: NORMAL
Neisseria Gonorrhea: NEGATIVE

## 2024-04-13 LAB — CBC
Hematocrit: 38.1 % (ref 34.0–46.6)
Hemoglobin: 12.2 g/dL (ref 11.1–15.9)
MCH: 28.6 pg (ref 26.6–33.0)
MCHC: 32 g/dL (ref 31.5–35.7)
MCV: 89 fL (ref 79–97)
Platelets: 316 x10E3/uL (ref 150–450)
RBC: 4.26 x10E6/uL (ref 3.77–5.28)
RDW: 13.2 % (ref 11.7–15.4)
WBC: 6.8 x10E3/uL (ref 3.4–10.8)

## 2024-04-16 ENCOUNTER — Ambulatory Visit

## 2024-04-16 ENCOUNTER — Ambulatory Visit: Attending: Maternal & Fetal Medicine

## 2024-04-16 DIAGNOSIS — Z3A36 36 weeks gestation of pregnancy: Secondary | ICD-10-CM | POA: Diagnosis not present

## 2024-04-16 DIAGNOSIS — O24113 Pre-existing diabetes mellitus, type 2, in pregnancy, third trimester: Secondary | ICD-10-CM | POA: Diagnosis not present

## 2024-04-16 DIAGNOSIS — O3663X0 Maternal care for excessive fetal growth, third trimester, not applicable or unspecified: Secondary | ICD-10-CM

## 2024-04-16 DIAGNOSIS — O10013 Pre-existing essential hypertension complicating pregnancy, third trimester: Secondary | ICD-10-CM | POA: Diagnosis not present

## 2024-04-16 DIAGNOSIS — E119 Type 2 diabetes mellitus without complications: Secondary | ICD-10-CM | POA: Diagnosis not present

## 2024-04-16 DIAGNOSIS — O24410 Gestational diabetes mellitus in pregnancy, diet controlled: Secondary | ICD-10-CM | POA: Diagnosis present

## 2024-04-16 NOTE — Procedures (Signed)
 Victorious C Police March 31, 1991 [redacted]w[redacted]d  Fetus A Non-Stress Test Interpretation for 04/16/2024  Indication: Chronic Hypertension, Diabetes, and LGA - NST only  Fetal Heart Rate A Mode: External Baseline Rate (A): 145 bpm Variability: Moderate Accelerations: 15 x 15 Decelerations: None Multiple birth?: No  Uterine Activity Mode: Palpation, Toco Contraction Frequency (min): none noted Resting Tone Palpated: Relaxed  Interpretation (Fetal Testing) Nonstress Test Interpretation: Reactive Comments: Reviewed with Dr. William

## 2024-04-17 ENCOUNTER — Other Ambulatory Visit: Payer: Self-pay

## 2024-04-17 ENCOUNTER — Inpatient Hospital Stay (HOSPITAL_COMMUNITY)
Admission: AD | Admit: 2024-04-17 | Discharge: 2024-04-17 | Disposition: A | Discharge status: Home / Self Care | Attending: Obstetrics and Gynecology | Admitting: Obstetrics and Gynecology

## 2024-04-17 ENCOUNTER — Encounter (HOSPITAL_COMMUNITY): Payer: Self-pay | Admitting: Obstetrics and Gynecology

## 2024-04-17 DIAGNOSIS — Z3A36 36 weeks gestation of pregnancy: Secondary | ICD-10-CM | POA: Insufficient documentation

## 2024-04-17 DIAGNOSIS — O368130 Decreased fetal movements, third trimester, not applicable or unspecified: Secondary | ICD-10-CM | POA: Diagnosis present

## 2024-04-17 DIAGNOSIS — Z3689 Encounter for other specified antenatal screening: Secondary | ICD-10-CM | POA: Insufficient documentation

## 2024-04-17 LAB — CULTURE, BETA STREP (GROUP B ONLY): Strep Gp B Culture: POSITIVE — AB

## 2024-04-17 NOTE — Discharge Instructions (Signed)
Labor Induction  Overview Labor induction - also known as inducing labor - is the stimulation of uterine contractions during pregnancy before labor begins on its own to achieve a vaginal birth. A health care provider might recommend labor induction for various reasons, primarily when there's concern for a mother's health or a baby's health. One of the most important factors in predicting the likelihood of a successful labor induction is how soft and distended your cervix is (cervical ripening). The benefits of labor induction typically outweigh the risks. If you're pregnant, understanding why and how labor induction is done can help you prepare. Why it's done To determine if labor induction is necessary, your health care provider will evaluate several factors, including your health, your baby's health, your baby's gestational age, weight and size, your baby's position in the uterus, and the status of your cervix. Reasons for labor induction include: . Postterm pregnancy. You're approaching two weeks beyond your due date, and labor hasn't started naturally.  . Premature rupture of membranes. Your water has broken, but labor hasn't begun.  Marland Kitchen. Chorioamnionitis. You have an infection in your uterus.  . Fetal growth restriction. The estimated weight of your baby is less than 10 percent of what is expected for the gestational age.  . Oligohydramnios. There's not enough amniotic fluid surrounding the baby.  . Gestational diabetes. You have diabetes that develops during pregnancy.  . High blood pressure disorders of pregnancy. You have a pregnancy complication characterized by high blood pressure and signs of damage to another organ system (preeclampsia), high blood pressure that was present before pregnancy or that occurs before 20 weeks of pregnancy (chronic high blood pressure), or high blood pressure that develops after 20 weeks of pregnancy (gestational hypertension).  . Placental abruption. Your placenta  peels away from the inner wall of the uterus before delivery - either partially or completely.  . Certain medical conditions. You have a medical condition such as kidney disease or obesity. Elective labor induction is the initiation of labor for convenience in a person with a term pregnancy who doesn't medically need the intervention. Elective labor inductions might be appropriate in some instances. For example, if you live far from the hospital or birthing center or you have a history of rapid deliveries, a scheduled induction might help you avoid an unattended delivery. In such cases, your health care provider will confirm that your baby's gestational age is at least 4339 weeks or older before induction to reduce the risk of health problems for your baby. Risks Labor induction carries various risks, including: . Failed induction. About 75 percent of first-time mothers who are induced will have a successful vaginal delivery. This means that about 25 percent of these women, who often start with an unripened cervix, might need a C-section. Your health care provider will discuss with you the possibility of a need for a C-section.  . Low heart rate. The medications used to induce labor - oxytocin or a prostaglandin - might cause abnormal or excessive contractions, which can diminish your baby's oxygen supply and lower your baby's heart rate.  . Infection. Some methods of labor induction, such as rupturing your membranes, might increase the risk of infection for both mother and baby. Prolonged membrane rupture increases the risk of an infection.  Marland Kitchen. Uterine rupture. This is a rare but serious complication in which your uterus tears open along the scar line from a prior C-section or major uterine surgery. Very rarely, uterine rupture can also occur in women  who had never had previous uterine surgery. An emergency C-section is needed to prevent life-threatening complications. Your uterus might need to be removed.   . Bleeding after delivery. Labor induction increases the risk that your uterine muscles won't properly contract after you give birth (uterine atony), which can lead to serious bleeding after delivery. Labor induction isn't appropriate for everyone. Labor induction might not be an option if: . You've had a prior C-section with a classical incision or major uterine surgery  . The placenta is blocking your cervix (placenta previa)  . Your baby is lying buttocks first (breech) or sideways (transverse lie)  . You have an active genital herpes infection  . The umbilical cord slips into your vagina before delivery (umbilical cord prolapse) If you've had a prior C-section and have labor induced, your health care provider will avoid certain medications to reduce the risk of uterine rupture. How you prepare Labor induction is done in a hospital or birthing center, where you and your baby can be monitored and labor and delivery services are readily available. However, some steps might be taken prior to admission. What you can expect During the procedure There are various methods for inducing labor. Depending on the circumstances, your health care provider might: . Ripen your cervix. Sometimes synthetic prostaglandins, which are typically placed inside the vagina, are used to thin or soften (ripen) the cervix. After prostaglandin use, your contractions and your baby's heart rate will be monitored. In other cases, a small tube (catheter) with an inflatable balloon on the end is inserted into the cervix. Filling the balloon with saline and resting it against the inside of the cervix helps ripen the cervix.  . Rupture the amniotic sac. With this technique, also known as an amniotomy, your health care provider makes a small opening in the amniotic sac with a plastic hook. You might feel a warm gush of fluid when the sac opens, also known as your water breaking. An amniotomy is done only if the cervix is partially  dilated and thinned and the baby's head is deep in the pelvis. Your baby's heart rate will be monitored before and after the procedure. Your health care provider will examine the amniotic fluid for traces of fecal waste (meconium).  . Use an intravenous medication. In the hospital, your health care provider might intravenously give you a synthetic version of oxytocin (Pitocin) - a hormone that causes the uterus to contract. Oxytocin is more effective at speeding up (augmenting) labor that has already begun than it is as a cervical ripening agent. Your contractions and your baby's heart rate will be continuously monitored. Keep in mind that your health care provider might also use a combination of these methods to induce labor. How long it takes for labor to start depends on how ripe your cervix is when your induction starts, the induction techniques used and how your body responds to them. If your cervix needs time to ripen, it might take days before labor begins. If you simply need a little push, you might be holding your baby in your arms in a matter of hours. After the procedure In most cases, labor induction leads to a successful vaginal birth. If labor induction fails, you might need to try another induction or have a C-section. If you have a successful vaginal delivery after induction, there might be no implications for future pregnancies. If the induction leads to a C-section, your health care provider can help you decide whether to attempt a vaginal  delivery with a subsequent pregnancy or to schedule a repeat C-section.  

## 2024-04-17 NOTE — MAU Provider Note (Signed)
 " History     CSN: [id number]  Arrival date and time: 04/17/24 1544   Event Date/Time   First Provider Initiated Contact with Patient 04/17/24 1725      Chief Complaint  Patient presents with   Decreased Fetal Movement   HPI [name] is a 33 y.o. year old G26P2002 female at [redacted]w[redacted]d weeks gestation who presents to MAU reporting no fetal movement all day.  She tried eating and drinking to get the baby to move but still had no movements.  She also reports lower abdominal cramping since Thursday; unsure if contractions.  She denies loss of fluid or vaginal bleeding.  She is scheduled for induction of labor on Monday 12/20 05/2023.  OB History     Gravida  3   Para  2   Term  2   Preterm  0   AB  0   Living  2      SAB  0   IAB  0   Ectopic  0   Multiple  0   Live Births  2           Past Medical History:  Diagnosis Date   Abnormal Pap smear    Acid reflux    ADHD (attention deficit hyperactivity disorder)    Asthma    childhood /grew out of it   Breast mass, right 01/11/2014   Negative breast imaging. Radiologist at [facility] states cannot see or feel mass.      Chlamydia    Chronic cystitis 07/02/2018   Degenerative disk disease    Depression    post partem   Dysmenorrhea 04/15/2012   Fractured fibula    left   Gestational diabetes    History of PCOS    Migraine    Pregnancy induced hypertension    Syphilis    2023   Trichomonas    Urinary tract infection    Vaginal Pap smear, abnormal    normal since    Past Surgical History:  Procedure Laterality Date   CESAREAN SECTION     DILATION AND CURETTAGE OF UTERUS     INDUCED ABORTION     TONSILLECTOMY AND ADENOIDECTOMY N/A 08/02/2020   Procedure: TONSILLECTOMY AND ADENOIDECTOMY;  Surgeon: Karis Clunes, MD;  Location: [facility] OR;  Service: ENT;  Laterality: N/A;   VAGINAL DELIVERY  2013   WISDOM TOOTH EXTRACTION      Family History  Problem Relation Age of Onset   Diabetes Mother     Hypertension Mother    Heart disease Mother    Diabetes Maternal Grandfather    Heart disease Maternal Grandmother        chf   Anesthesia problems Neg Hx    Hypotension Neg Hx    Malignant hyperthermia Neg Hx    Pseudochol deficiency Neg Hx     Social History[1]  Allergies: Allergies[2]  Medications Prior to Admission  Medication Sig Dispense Refill Last Dose/Taking   Accu-Chek Softclix Lancets lancets 120 each by Other route 4 (four) times daily. Use as instructed 100 each 12 04/17/2024   acetaminophen  (TYLENOL ) 500 MG tablet Take 2 tablets (1,000 mg total) by mouth every 6 (six) hours as needed.   Past Week   aspirin  EC 81 MG tablet Take 2 tablets (162 mg total) by mouth daily. Start taking when you are [redacted] weeks pregnant for rest of pregnancy for prevention of preeclampsia 300 tablet 2 04/17/2024   cyclobenzaprine  (FLEXERIL ) 10 MG tablet Take  1 tablet (10 mg total) by mouth 2 (two) times daily as needed for muscle spasms. 30 tablet 2 04/17/2024   metFORMIN  (GLUCOPHAGE ) 500 MG tablet Take 2 tablets (1,000 mg total) by mouth 2 (two) times daily with a meal. 60 tablet 5 04/17/2024   NIFEdipine  (ADALAT  CC) 30 MG 24 hr tablet Take 1 tablet (30 mg total) by mouth daily. 90 tablet 2 04/17/2024   Prenatal Vit-Fe Fumarate-FA (PRENATAL COMPLETE) 14-0.4 MG TABS Take 1 tablet by mouth daily. 60 tablet 6 04/17/2024   Blood Glucose Monitoring Suppl DEVI May substitute to any manufacturer covered by patient's insurance. 1 Units 0    glucose blood test strip Use as instructed 100 each 12    nystatin -triamcinolone  (MYCOLOG II) cream Apply to affected area TID prn 30 g 0 Unknown    Review of Systems  Constitutional: Negative.   HENT: Negative.    Eyes: Negative.   Respiratory: Negative.    Cardiovascular: Negative.   Gastrointestinal: Negative.   Endocrine: Negative.   Genitourinary:        No FM all day  Musculoskeletal: Negative.   Skin: Negative.   Allergic/Immunologic: Negative.    Neurological: Negative.   Hematological: Negative.   Psychiatric/Behavioral: Negative.     Physical Exam   Patient Vitals for the past 24 hrs:  BP Temp Temp src Pulse Resp SpO2 Height Weight  04/17/24 1621 134/77 -- -- (!) 116 -- -- -- --  04/17/24 1601 (!) 141/70 98 F (36.7 C) Oral (!) 118 18 97 % 5' 2 (1.575 m) (!) 160.7 kg    Physical Exam Constitutional:      Appearance: Normal appearance. She is obese.  Cardiovascular:     Rate and Rhythm: Normal rate.  Pulmonary:     Effort: Pulmonary effort is normal.  Abdominal:     Palpations: Abdomen is soft.  Musculoskeletal:        General: Normal range of motion.  Skin:    General: Skin is warm and dry.  Neurological:     Mental Status: She is alert and oriented to Force, place, and time.  Psychiatric:        Mood and Affect: Mood normal.        Behavior: Behavior normal.        Thought Content: Thought content normal.        Judgment: Judgment normal.    REACTIVE NST - FHR: 145 bpm / moderate variability / accels present / decels absent / TOCO: irregular UC's  MAU Course  Procedures  MDM EFM  Assessment and Plan  1. NST (non-stress test) reactive (Primary) - Reassurance given that FM is WNL  2. [redacted] weeks gestation of pregnancy   - Discharge home - Keep scheduled appt with L&D on 04/19/2024 - Patient verbalized an understanding of the plan of care and agrees.   Ala Cart, CNM 04/17/2024, 5:25 PM     [1]  Social History Tobacco Use   Smoking status: Former    Current packs/day: 0.50    Average packs/day: 0.5 packs/day for 7.0 years (3.5 ttl pk-yrs)    Types: Cigarettes   Smokeless tobacco: Never  Vaping Use   Vaping status: Never Used  Substance Use Topics   Alcohol use: Not Currently    Alcohol/week: 1.0 standard drink of alcohol    Types: 1 Shots of liquor per week    Comment: occ but was a heavy user in 2015 due to boyfriend passed away.   Drug use: Not  Currently    Types: Marijuana     Comment: last 10/15/23  [2]  Allergies Allergen Reactions   Other Anaphylaxis    Grapes, strawberries, bananas, pineapple, oranges, nuts   Peanut (Diagnostic) Anaphylaxis   Pineapple Anaphylaxis and Swelling   Betadine [Povidone Iodine] Itching    Betadine causes itching and skin redness and irritation   Povidone-Iodine Itching, Rash and Dermatitis    Betadine causes itching and skin redness and irritation   Pollen Extract Itching   Sulfa  Antibiotics Itching   Bee Pollen Itching   Iodine Itching   Latex Itching   Tomato Itching and Rash   Tramadol  Nausea And Vomiting and Nausea Only   "

## 2024-04-17 NOTE — MAU Note (Signed)
 Teresa [name] is a 33 y.o. at [redacted]w[redacted]d here in MAU reporting: has felt no fetal movement all day. Had tried food and liquids to try to make baby move with no results. Lower abdominal cramping that has been present since Thursday. Denies any LOF or VB.   Onset of complaint: today  Pain score: 7 Vitals:   04/17/24 1601  BP: (!) 141/70  Pulse: (!) 118  Resp: 18  Temp: 98 F (36.7 C)  SpO2: 97%     FHT:138 Lab orders placed from triage:  NST

## 2024-04-19 ENCOUNTER — Inpatient Hospital Stay (HOSPITAL_COMMUNITY)
Admission: RE | Admit: 2024-04-19 | Discharge: 2024-04-22 | DRG: 787 | Disposition: A | Discharge status: Home / Self Care | Payer: Self-pay | Attending: Family Medicine | Admitting: Family Medicine

## 2024-04-19 ENCOUNTER — Other Ambulatory Visit: Payer: Self-pay

## 2024-04-19 ENCOUNTER — Encounter (HOSPITAL_COMMUNITY): Payer: Self-pay | Admitting: Obstetrics and Gynecology

## 2024-04-19 ENCOUNTER — Encounter: Admitting: Obstetrics and Gynecology

## 2024-04-19 ENCOUNTER — Inpatient Hospital Stay (HOSPITAL_COMMUNITY)

## 2024-04-19 DIAGNOSIS — O24424 Gestational diabetes mellitus in childbirth, insulin controlled: Secondary | ICD-10-CM | POA: Diagnosis not present

## 2024-04-19 DIAGNOSIS — Z833 Family history of diabetes mellitus: Secondary | ICD-10-CM | POA: Diagnosis not present

## 2024-04-19 DIAGNOSIS — O1002 Pre-existing essential hypertension complicating childbirth: Secondary | ICD-10-CM | POA: Diagnosis present

## 2024-04-19 DIAGNOSIS — O99820 Streptococcus B carrier state complicating pregnancy: Secondary | ICD-10-CM | POA: Diagnosis not present

## 2024-04-19 DIAGNOSIS — Z3A37 37 weeks gestation of pregnancy: Secondary | ICD-10-CM

## 2024-04-19 DIAGNOSIS — K219 Gastro-esophageal reflux disease without esophagitis: Secondary | ICD-10-CM | POA: Diagnosis present

## 2024-04-19 DIAGNOSIS — Z8249 Family history of ischemic heart disease and other diseases of the circulatory system: Secondary | ICD-10-CM | POA: Diagnosis not present

## 2024-04-19 DIAGNOSIS — O34211 Maternal care for low transverse scar from previous cesarean delivery: Secondary | ICD-10-CM | POA: Diagnosis present

## 2024-04-19 DIAGNOSIS — O09893 Supervision of other high risk pregnancies, third trimester: Secondary | ICD-10-CM

## 2024-04-19 DIAGNOSIS — E1165 Type 2 diabetes mellitus with hyperglycemia: Principal | ICD-10-CM | POA: Diagnosis present

## 2024-04-19 DIAGNOSIS — O0990 Supervision of high risk pregnancy, unspecified, unspecified trimester: Secondary | ICD-10-CM

## 2024-04-19 DIAGNOSIS — O24410 Gestational diabetes mellitus in pregnancy, diet controlled: Secondary | ICD-10-CM | POA: Diagnosis present

## 2024-04-19 DIAGNOSIS — O24425 Gestational diabetes mellitus in childbirth, controlled by oral hypoglycemic drugs: Principal | ICD-10-CM | POA: Diagnosis present

## 2024-04-19 DIAGNOSIS — O3663X0 Maternal care for excessive fetal growth, third trimester, not applicable or unspecified: Secondary | ICD-10-CM | POA: Diagnosis present

## 2024-04-19 DIAGNOSIS — O99824 Streptococcus B carrier state complicating childbirth: Secondary | ICD-10-CM | POA: Diagnosis present

## 2024-04-19 DIAGNOSIS — Z30430 Encounter for insertion of intrauterine contraceptive device: Secondary | ICD-10-CM

## 2024-04-19 DIAGNOSIS — Z98891 History of uterine scar from previous surgery: Secondary | ICD-10-CM

## 2024-04-19 DIAGNOSIS — O10013 Pre-existing essential hypertension complicating pregnancy, third trimester: Secondary | ICD-10-CM | POA: Diagnosis present

## 2024-04-19 DIAGNOSIS — O9962 Diseases of the digestive system complicating childbirth: Secondary | ICD-10-CM | POA: Diagnosis present

## 2024-04-19 DIAGNOSIS — O99214 Obesity complicating childbirth: Secondary | ICD-10-CM | POA: Diagnosis present

## 2024-04-19 DIAGNOSIS — Z975 Presence of (intrauterine) contraceptive device: Secondary | ICD-10-CM

## 2024-04-19 DIAGNOSIS — I10 Essential (primary) hypertension: Secondary | ICD-10-CM | POA: Diagnosis present

## 2024-04-19 DIAGNOSIS — Z87891 Personal history of nicotine dependence: Secondary | ICD-10-CM

## 2024-04-19 LAB — CBC
HCT: 36.6 % (ref 36.0–46.0)
Hemoglobin: 11.9 g/dL — ABNORMAL LOW (ref 12.0–15.0)
MCH: 28.8 pg (ref 26.0–34.0)
MCHC: 32.5 g/dL (ref 30.0–36.0)
MCV: 88.6 fL (ref 80.0–100.0)
Platelets: 316 K/uL (ref 150–400)
RBC: 4.13 MIL/uL (ref 3.87–5.11)
RDW: 13.3 % (ref 11.5–15.5)
WBC: 8.4 K/uL (ref 4.0–10.5)
nRBC: 0 % (ref 0.0–0.2)

## 2024-04-19 MED ORDER — OXYCODONE-ACETAMINOPHEN 5-325 MG PO TABS: 1.0000 | ORAL_TABLET | ORAL | Status: DC | PRN

## 2024-04-19 MED ORDER — LACTATED RINGERS IV SOLN: INTRAVENOUS | Status: DC

## 2024-04-19 MED ORDER — OXYTOCIN BOLUS FROM INFUSION: 333.0000 mL | Freq: Once | INTRAVENOUS | Status: DC

## 2024-04-19 MED ORDER — ACETAMINOPHEN 325 MG PO TABS: 650.0000 mg | ORAL_TABLET | ORAL | Status: DC | PRN

## 2024-04-19 MED ORDER — ONDANSETRON HCL 4 MG/2ML IJ SOLN: 4.0000 mg | Freq: Four times a day (QID) | INTRAMUSCULAR | Status: DC | PRN

## 2024-04-19 MED ORDER — TERBUTALINE SULFATE 1 MG/ML IJ SOLN: 0.2500 mg | Freq: Once | INTRAMUSCULAR | Status: DC | PRN

## 2024-04-19 MED ORDER — OXYCODONE-ACETAMINOPHEN 5-325 MG PO TABS: 2.0000 | ORAL_TABLET | ORAL | Status: DC | PRN

## 2024-04-19 MED ORDER — SOD CITRATE-CITRIC ACID 500-334 MG/5ML PO SOLN
30.0000 mL | ORAL | Status: DC | PRN
  Administered 2024-04-20: 30 mL via ORAL
  Filled 2024-04-19: qty 30

## 2024-04-19 MED ORDER — LACTATED RINGERS IV SOLN: 500.0000 mL | INTRAVENOUS | Status: DC | PRN

## 2024-04-19 MED ORDER — OXYTOCIN-SODIUM CHLORIDE 30-0.9 UT/500ML-% IV SOLN
1.0000 m[IU]/min | INTRAVENOUS | Status: DC
  Administered 2024-04-20: 1 m[IU]/min via INTRAVENOUS
  Administered 2024-04-20: 8 m[IU]/min via INTRAVENOUS
  Filled 2024-04-19: qty 500

## 2024-04-19 MED ORDER — OXYTOCIN-SODIUM CHLORIDE 30-0.9 UT/500ML-% IV SOLN: 2.5000 [IU]/h | INTRAVENOUS | Status: DC

## 2024-04-19 MED ORDER — LIDOCAINE HCL (PF) 1 % IJ SOLN: 30.0000 mL | INTRAMUSCULAR | Status: DC | PRN

## 2024-04-19 NOTE — H&P (Incomplete)
 OBSTETRIC ADMISSION HISTORY AND PHYSICAL  Carolann C Latin is 33 y.o. [id number] with IUP at [redacted]w[redacted]d 05/10/2024, by Ultrasound presenting for ***. She received her prenatal care at [facility]   ROS (+) FM, ctx*** (-) VB, LOF. HA, visual changes, CP, SOB, RUQ pain, peripheral edema.   Prenatal History/Complications NURSING  PROVIDER  Conservator, [employer] Dating by U/S at 8.5 wks  [facility] Model Traditional Anatomy U/S normal  Initiated care at  [facility]  English               LAB RESULTS   Support Grygiel Germaine McGowan (FOB) Genetics NIPS: LR (viewed on patients phone) AFP:       NT/IT (FT only)        Carrier Screen Horizon:   Rhogam  --/--/O POS (05/05 0950) A1C/GTT Early HgbA1C: 5.9, early 2 hour normal Third trimester 2 hr GTT: 104/203/147  Flu Vaccine Declined 04/06/24      TDaP Vaccine Declined 04/06/24 Blood Type --/--/O POS (05/05 0950)  RSV Vaccine Declined 04/06/24 Antibody    COVID Vaccine   Rubella 1.48 (10/27 1123)  Feeding Plan both RPR Non Reactive (10/27 1005)  Contraception  Maybe tubal? HBsAg Negative (10/27 1123)  Circumcision Yes, if boy HIV Non Reactive (10/27 1005)  Pediatrician    HCVAb    Prenatal Classes        BTL Consent   Pap 09/05/2022 Normal, repeat 2027       Diagnosis  Date Value Ref Range Status  11/21/2017     Final    NEGATIVE FOR INTRAEPITHELIAL LESIONS OR MALIGNANCY. BENIGN REACTIVE/REPARATIVE CHANGES.    BTL Pre-payment   GC/CT Initial:   36wks:    VBAC Consent 02/23/24 GBS   For PCN allergy, check sensitivities   BRx Optimized? [ ]  yes   [ ]  no      DME Rx [ ]  BP cuff [ ]  Weight Scale Waterbirth  [ ]  Class [ ]  Consent [ ]  CNM visit  PHQ9 & GAD7 [  ] new OB [  ] 33 weeks  [  ] 36 weeks Induction  [ ]  Orders Entered [ ] Foley Y/N   OB History  Gravida Para Term Preterm AB Living  3 2 2  0 0 2  SAB IAB Ectopic Multiple Live Births  0 0 0 0 2    # Outcome Date GA Lbr Len/2nd Weight Sex Type Anes PTL Lv  3 Current            2 Term 03/05/23 [redacted]w[redacted]d  3033 g F CS-Unspec Spinal  LIV     Complications: Failure to progress in labor  1 Term 11/27/11 [redacted]w[redacted]d 702:35 / 01:55 3790 g F Vag-Spont EPI  LIV   Patient Active Problem List   Diagnosis Date Noted   Uncontrolled diabetes mellitus with hyperglycemia (HCC) 04/19/2024   Short interval between pregnancies affecting pregnancy in third trimester, antepartum 03/21/2024   Large for gestational age fetus affecting mother, antepartum, third trimester, single gestation 03/05/2024   Gestational diabetes, diet controlled 03/01/2024   History of cesarean delivery 11/28/2023   History of gestational diabetes 11/28/2023   Supervision of high risk pregnancy, antepartum 10/23/2023   Pre-existing essential hypertension affecting pregnancy in third trimester 10/23/2023   PCOS (polycystic ovarian syndrome) 03/05/2021    Past Medical History: Past Medical History:  Diagnosis Date   Abnormal Pap smear  Acid reflux    ADHD (attention deficit hyperactivity disorder)    Asthma    childhood /grew out of it   Breast mass, right 01/11/2014   Negative breast imaging. Radiologist at [facility] states cannot see or feel mass.      Chlamydia    Chronic cystitis 07/02/2018   Degenerative disk disease    Depression    post partem   Dysmenorrhea 04/15/2012   Fractured fibula    left   Gestational diabetes    History of PCOS    Migraine    Pregnancy induced hypertension    Syphilis    2023   Trichomonas    Urinary tract infection    Vaginal Pap smear, abnormal    normal since    Past Surgical History: Past Surgical History:  Procedure Laterality Date   CESAREAN SECTION     DILATION AND CURETTAGE OF UTERUS     INDUCED ABORTION     TONSILLECTOMY AND ADENOIDECTOMY N/A 08/02/2020   Procedure: TONSILLECTOMY AND ADENOIDECTOMY;  Surgeon: Karis Clunes, MD;  Location: [facility] OR;  Service: ENT;  Laterality: N/A;   VAGINAL DELIVERY  2013    WISDOM TOOTH EXTRACTION      Social History Social History   Socioeconomic History   Marital status: Single    Spouse name: Not on file   Number of children: Not on file   Years of education: Not on file   Highest education level: Not on file  Occupational History   Not on file  Tobacco Use   Smoking status: Former    Current packs/day: 0.50    Average packs/day: 0.5 packs/day for 7.0 years (3.5 ttl pk-yrs)    Types: Cigarettes   Smokeless tobacco: Never  Vaping Use   Vaping status: Never Used  Substance and Sexual Activity   Alcohol use: Not Currently    Alcohol/week: 1.0 standard drink of alcohol    Types: 1 Shots of liquor per week    Comment: occ but was a heavy user in 2015 due to boyfriend passed away.   Drug use: Not Currently    Types: Marijuana    Comment: last 10/15/23   Sexual activity: Yes    Birth control/protection: None    Comment: 2 weeks ago  Other Topics Concern   Not on file  Social History Narrative   Not on file   Social Drivers of Health   Tobacco Use: Medium Risk (04/19/2024)   Patient History    Smoking Tobacco Use: Former    Smokeless Tobacco Use: Never    Passive Exposure: Not on Actuary Strain: Not on file  Food Insecurity: No Food Insecurity (04/19/2024)   Epic    Worried About Programme Researcher, Broadcasting/film/video in the Last Year: Never true    Ran Out of Food in the Last Year: Never true  Transportation Needs: No Transportation Needs (04/19/2024)   Epic    Lack of Transportation (Medical): No    Lack of Transportation (Non-Medical): No  Physical Activity: Not on file  Stress: Not on file  Social Connections: Unknown (12/24/2023)   Social Connection and Isolation Panel    Frequency of Communication with Friends and Family: More than three times a week    Frequency of Social Gatherings with Friends and Family: More than three times a week    Attends Religious Services: Patient declined    Active Member of  Clubs or Organizations: Patient declined    Attends Club or  Organization Meetings: Patient declined    Marital Status: Never married  Depression (PHQ2-9): Low Risk (10/27/2023)   Depression (PHQ2-9)    PHQ-2 Score: 0  Alcohol Screen: Not on file  Housing: Low Risk (04/19/2024)   Epic    Unable to Pay for Housing in the Last Year: No    Number of Times Moved in the Last Year: 1    Homeless in the Last Year: No  Utilities: Not At Risk (04/19/2024)   Epic    Threatened with loss of utilities: No  Health Literacy: Not on file    Family History: Family History  Problem Relation Age of Onset   Diabetes Mother    Hypertension Mother    Heart disease Mother    Diabetes Maternal Grandfather    Heart disease Maternal Grandmother        chf   Anesthesia problems Neg Hx    Hypotension Neg Hx    Malignant hyperthermia Neg Hx    Pseudochol deficiency Neg Hx     Allergies: Allergies[1]  Medications Prior to Admission  Medication Sig Dispense Refill Last Dose/Taking   aspirin  EC 81 MG tablet Take 2 tablets (162 mg total) by mouth daily. Start taking when you are [redacted] weeks pregnant for rest of pregnancy for prevention of preeclampsia 300 tablet 2 04/19/2024 at 10:00 AM   cyclobenzaprine  (FLEXERIL ) 10 MG tablet Take 1 tablet (10 mg total) by mouth 2 (two) times daily as needed for muscle spasms. 30 tablet 2 04/19/2024 at 10:00 AM   metFORMIN  (GLUCOPHAGE ) 500 MG tablet Take 2 tablets (1,000 mg total) by mouth 2 (two) times daily with a meal. 60 tablet 5 04/19/2024 at 10:00 AM   NIFEdipine  (ADALAT  CC) 30 MG 24 hr tablet Take 1 tablet (30 mg total) by mouth daily. 90 tablet 2 04/19/2024 at 10:00 AM   Prenatal Vit-Fe Fumarate-FA (PRENATAL COMPLETE) 14-0.4 MG TABS Take 1 tablet by mouth daily. 60 tablet 6 04/19/2024 at 10:00 AM   Accu-Chek Softclix Lancets lancets 120 each by Other route 4 (four) times daily. Use as instructed 100 each 12    acetaminophen  (TYLENOL ) 500 MG  tablet Take 2 tablets (1,000 mg total) by mouth every 6 (six) hours as needed.      Blood Glucose Monitoring Suppl DEVI May substitute to any manufacturer covered by patient's insurance. 1 Units 0    glucose blood test strip Use as instructed 100 each 12    nystatin -triamcinolone  (MYCOLOG II) cream Apply to affected area TID prn 30 g 0      Review of Systems  All systems reviewed and negative except as stated in HPI  PHYSICAL EXAM Blood pressure 138/70, pulse (!) 101, temperature 98.3 F (36.8 C), temperature source Oral, resp. rate 18, height 5' 2 (1.575 m), weight (!) 164.3 kg, last menstrual period 07/17/2023, unknown if currently breastfeeding. General appearance: alert and cooperative Lungs: respirations nonlabored Heart: regular rate*** Abdomen: gravid  Fetal monitoringBaseline: *** bpm, Variability: {fhr variability:31519}, Accelerations: {fhr accel present:31520}, and Decelerations: {FHR DECEL PRESENT:31526} Uterine activity***    Presentation: {desc; fetal presentation:14558}   Prenatal labs: ABO, Rh: --/--/PENDING (12/22 2318) Antibody: PENDING (12/22 2318) Rubella: 1.48 (10/27 1123) RPR: Non Reactive (10/27 1005)  HBsAg: Negative (10/27 1123)  HIV: Non Reactive (10/27 1005)   Lab Results  Component Value Date   GBS Positive (A) 04/13/2024    Anatomy US : ***  Immunization History  Administered Date(s) Administered   DTP 07/30/1991, 09/21/1991, 11/23/1991, 07/18/1992   DTaP  05/14/1995   Fluzone Influenza virus vaccine,trivalent (IIV3), split virus 05/05/2007   HIB, Unspecified 07/30/1991, 09/21/1991, 11/23/1991, 07/18/1992   HPV Quadrivalent 03/04/2006, 05/05/2007, 03/29/2008   Hep B, Unspecified 07/25/90, 07/30/1991, 01/26/1992   Hepatitis A, Ped/Adol-2 Dose 03/04/2006, 05/05/2007   Influenza Split 04/15/2012   Influenza, Seasonal, Injecte, Preservative Fre 03/04/2006   Influenza-Unspecified 02/23/1992, 03/22/1992, 03/07/1993, 03/13/1994,  03/12/1995, 01/30/2023   MMR 07/18/1992, 05/14/1995   Meningococcal Conjugate 03/04/2006   Moderna Sars-Covid-2 Vaccination 07/06/2020   OPV 07/30/1991, 09/21/1991, 07/18/1992, 05/14/1995   PPD Test 08/25/2017, 11/01/2019   Pneumococcal Polysaccharide-23 11/29/2011   Tdap 12/25/2004, 07/31/2017, 01/27/2023, 04/06/2024    Prenatal Transfer Tool  Maternal Diabetes: {Maternal Diabetes:3043596} Genetic Screening: {Genetic Screening:20205} Maternal Ultrasounds/Referrals: {Maternal Ultrasounds / Referrals:20211} Fetal Ultrasounds or other Referrals:  {Fetal Ultrasounds or Other Referrals:20213} Maternal Substance Abuse:  {Maternal Substance Abuse:20223} Significant Maternal Medications:  {Significant Maternal Meds:20233} Significant Maternal Lab Results: {Significant Maternal Lab Results:20235} Number of Prenatal Visits:{Prenatal Visits:27860} Maternal Vaccinations:{Maternal Immunizations:31012} Other Comments:  {Other Comments:20251}   Results for orders placed or performed during the hospital encounter of 04/19/24 (from the past 24 hours)  Type and screen   Collection Time: 04/19/24 11:18 PM  Result Value Ref Range   ABO/RH(D) PENDING    Antibody Screen PENDING    Sample Expiration      04/22/2024,2359 Performed at [facility], [address], [place], [place] [address]     Patient Active Problem List   Diagnosis Date Noted   Uncontrolled diabetes mellitus with hyperglycemia (HCC) 04/19/2024   Short interval between pregnancies affecting pregnancy in third trimester, antepartum 03/21/2024   Large for gestational age fetus affecting mother, antepartum, third trimester, single gestation 03/05/2024   Gestational diabetes, diet controlled 03/01/2024   History of cesarean delivery 11/28/2023   History of gestational diabetes 11/28/2023   Supervision of high risk pregnancy, antepartum 10/23/2023   Pre-existing essential hypertension affecting pregnancy in third  trimester 10/23/2023   PCOS (polycystic ovarian syndrome) 03/05/2021    ASSESSMENT & PLAN Rosangelica C Grime is 33 y.o. [id number] with IUP at [redacted]w[redacted]d 05/10/2024, by Ultrasound admitted for ***.  Sono at ***: normal anatomy, *** presentation, *** placenta, EFW ***g, (***%)  #Labor: *** #Pain: Per patient preference, encourage ambulation #FWB: Cat ***  #***  #GBS status:  {gen pos wzh:684356} #Feeding: {Infant feeding:32067} #Reproductive Life planning: {Contraceptives:21111124} #Circ:  {yes/no/default n/a:21102::not applicable}   Conard Me, SNM, RNC-OB Student Nurse Midwife 04/19/2024 11:56 PM       [1] Allergies Allergen Reactions   Other Anaphylaxis    Grapes, strawberries, bananas, pineapple, oranges, nuts   Peanut (Diagnostic) Anaphylaxis   Pineapple Anaphylaxis and Swelling   Betadine [Povidone Iodine] Itching    Betadine causes itching and skin redness and irritation   Povidone-Iodine Itching, Rash and Dermatitis    Betadine causes itching and skin redness and irritation   Pollen Extract Itching   Sulfa  Antibiotics Itching   Bee Pollen Itching   Iodine Itching   Latex Itching   Tomato Itching and Rash   Tramadol  Nausea And Vomiting and Nausea Only

## 2024-04-19 NOTE — H&P (Addendum)
 OBSTETRIC ADMISSION HISTORY AND PHYSICAL  Teresa C Herandez is 33 y.o. [id number] with IUP at [redacted]w[redacted]d 05/10/2024, by Ultrasound presenting for GDM well controlled on metformin  1000 mg BID . Pre-existing essential hypertension affecting pregnancy in third trimester. She received her prenatal care at [facility]. History of c/section for failed IOL at [facility].  ROS (+) FM, ctx none (-) VB, LOF. HA, visual changes, CP, SOB, RUQ pain, peripheral edema.   Prenatal History/Complications NURSING  PROVIDER  Conservator, [employer] [employer] Dating by U/S at 8.5 wks  [facility] Model Traditional Anatomy U/S normal  Initiated care at  [employer]  English               LAB RESULTS   Support Sigmon Germaine McGowan (FOB) Genetics NIPS: LR (viewed on patients phone) AFP:       NT/IT (FT only)        Carrier Screen Horizon:   Rhogam  --/--/O POS (05/05 0950) A1C/GTT Early HgbA1C: 5.9, early 2 hour normal Third trimester 2 hr GTT: 104/203/147  Flu Vaccine Declined 04/06/24      TDaP Vaccine Declined 04/06/24 Blood Type --/--/O POS (05/05 0950)  RSV Vaccine Declined 04/06/24 Antibody    COVID Vaccine   Rubella 1.48 (10/27 1123)  Feeding Plan both RPR Non Reactive (10/27 1005)  Contraception  Maybe tubal? HBsAg Negative (10/27 1123)  Circumcision Yes, if boy HIV Non Reactive (10/27 1005)  Pediatrician    HCVAb    Prenatal Classes        BTL Consent   Pap 09/05/2022 Normal, repeat 2027       Diagnosis  Date Value Ref Range Status  11/21/2017     Final    NEGATIVE FOR INTRAEPITHELIAL LESIONS OR MALIGNANCY. BENIGN REACTIVE/REPARATIVE CHANGES.    BTL Pre-payment   GC/CT Initial:   36wks:    VBAC Consent 02/23/24 GBS   For PCN allergy, check sensitivities   BRx Optimized? [ ]  yes   [ ]  no      DME Rx [ ]  BP cuff [ ]  Weight Scale Waterbirth  [ ]  Class [ ]  Consent [ ]  CNM visit  PHQ9 & GAD7 [  ] new OB [  ] 58 weeks  [  ] 36 weeks Induction  [ ]  Orders Entered [ ] Foley Y/N   OB History   Gravida Para Term Preterm AB Living  3 2 2  0 0 2  SAB IAB Ectopic Multiple Live Births  0 0 0 0 2    # Outcome Date GA Lbr Len/2nd Weight Sex Type Anes PTL Lv  3 Current           2 Term 03/05/23 [redacted]w[redacted]d  3033 g F CS-Unspec Spinal  LIV     Complications: Failure to progress in labor  1 Term 11/27/11 [redacted]w[redacted]d 702:35 / 01:55 3790 g F Vag-Spont EPI  LIV   Patient Active Problem List   Diagnosis Date Noted   Uncontrolled diabetes mellitus with hyperglycemia (HCC) 04/19/2024   Short interval between pregnancies affecting pregnancy in third trimester, antepartum 03/21/2024   Large for gestational age fetus affecting mother, antepartum, third trimester, single gestation 03/05/2024   Gestational diabetes, diet controlled 03/01/2024   History of cesarean delivery 11/28/2023   History of gestational diabetes 11/28/2023   Supervision of high risk pregnancy, antepartum 10/23/2023   Pre-existing essential hypertension affecting pregnancy in third trimester 10/23/2023  PCOS (polycystic ovarian syndrome) 03/05/2021    Past Medical History: Past Medical History:  Diagnosis Date   Abnormal Pap smear    Acid reflux    ADHD (attention deficit hyperactivity disorder)    Asthma    childhood /grew out of it   Breast mass, right 01/11/2014   Negative breast imaging. Radiologist at [facility] states cannot see or feel mass.      Chlamydia    Chronic cystitis 07/02/2018   Degenerative disk disease    Depression    post partem   Dysmenorrhea 04/15/2012   Fractured fibula    left   Gestational diabetes    History of PCOS    Migraine    Pregnancy induced hypertension    Syphilis    2023   Trichomonas    Urinary tract infection    Vaginal Pap smear, abnormal    normal since    Past Surgical History: Past Surgical History:  Procedure Laterality Date   CESAREAN SECTION     DILATION AND CURETTAGE OF UTERUS     INDUCED ABORTION     TONSILLECTOMY AND ADENOIDECTOMY N/A 08/02/2020    Procedure: TONSILLECTOMY AND ADENOIDECTOMY;  Surgeon: Karis Clunes, MD;  Location: [facility] OR;  Service: ENT;  Laterality: N/A;   VAGINAL DELIVERY  2013   WISDOM TOOTH EXTRACTION      Social History Social History   Socioeconomic History   Marital status: Single    Spouse name: Not on file   Number of children: Not on file   Years of education: Not on file   Highest education level: Not on file  Occupational History   Not on file  Tobacco Use   Smoking status: Former    Current packs/day: 0.50    Average packs/day: 0.5 packs/day for 7.0 years (3.5 ttl pk-yrs)    Types: Cigarettes   Smokeless tobacco: Never  Vaping Use   Vaping status: Never Used  Substance and Sexual Activity   Alcohol use: Not Currently    Alcohol/week: 1.0 standard drink of alcohol    Types: 1 Shots of liquor per week    Comment: occ but was a heavy user in 2015 due to boyfriend passed away.   Drug use: Not Currently    Types: Marijuana    Comment: last 10/15/23   Sexual activity: Yes    Birth control/protection: None    Comment: 2 weeks ago  Other Topics Concern   Not on file  Social History Narrative   Not on file   Social Drivers of Health   Tobacco Use: Medium Risk (04/19/2024)   Patient History    Smoking Tobacco Use: Former    Smokeless Tobacco Use: Never    Passive Exposure: Not on Actuary Strain: Not on file  Food Insecurity: No Food Insecurity (04/19/2024)   Epic    Worried About Programme Researcher, Broadcasting/film/video in the Last Year: Never true    Ran Out of Food in the Last Year: Never true  Transportation Needs: No Transportation Needs (04/19/2024)   Epic    Lack of Transportation (Medical): No    Lack of Transportation (Non-Medical): No  Physical Activity: Not on file  Stress: Not on file  Social Connections: Unknown (12/24/2023)   Social Connection and Isolation Panel    Frequency of Communication with Friends and Family: More than three times a week    Frequency of Social Gatherings  with Friends and Family: More than three times a week  Attends Religious Services: Patient declined    Active Member of Clubs or Organizations: Patient declined    Attends Banker Meetings: Patient declined    Marital Status: Never married  Depression (PHQ2-9): Low Risk (10/27/2023)   Depression (PHQ2-9)    PHQ-2 Score: 0  Alcohol Screen: Not on file  Housing: Low Risk (04/19/2024)   Epic    Unable to Pay for Housing in the Last Year: No    Number of Times Moved in the Last Year: 1    Homeless in the Last Year: No  Utilities: Not At Risk (04/19/2024)   Epic    Threatened with loss of utilities: No  Health Literacy: Not on file    Family History: Family History  Problem Relation Age of Onset   Diabetes Mother    Hypertension Mother    Heart disease Mother    Diabetes Maternal Grandfather    Heart disease Maternal Grandmother        chf   Anesthesia problems Neg Hx    Hypotension Neg Hx    Malignant hyperthermia Neg Hx    Pseudochol deficiency Neg Hx     Allergies: Allergies[1]  Medications Prior to Admission  Medication Sig Dispense Refill Last Dose/Taking   aspirin  EC 81 MG tablet Take 2 tablets (162 mg total) by mouth daily. Start taking when you are [redacted] weeks pregnant for rest of pregnancy for prevention of preeclampsia 300 tablet 2 04/19/2024 at 10:00 AM   cyclobenzaprine  (FLEXERIL ) 10 MG tablet Take 1 tablet (10 mg total) by mouth 2 (two) times daily as needed for muscle spasms. 30 tablet 2 04/19/2024 at 10:00 AM   metFORMIN  (GLUCOPHAGE ) 500 MG tablet Take 2 tablets (1,000 mg total) by mouth 2 (two) times daily with a meal. 60 tablet 5 04/19/2024 at 10:00 AM   NIFEdipine  (ADALAT  CC) 30 MG 24 hr tablet Take 1 tablet (30 mg total) by mouth daily. 90 tablet 2 04/19/2024 at 10:00 AM   Prenatal Vit-Fe Fumarate-FA (PRENATAL COMPLETE) 14-0.4 MG TABS Take 1 tablet by mouth daily. 60 tablet 6 04/19/2024 at 10:00 AM   Accu-Chek Softclix Lancets lancets 120 each by  Other route 4 (four) times daily. Use as instructed 100 each 12    acetaminophen  (TYLENOL ) 500 MG tablet Take 2 tablets (1,000 mg total) by mouth every 6 (six) hours as needed.      Blood Glucose Monitoring Suppl DEVI May substitute to any manufacturer covered by patient's insurance. 1 Units 0    glucose blood test strip Use as instructed 100 each 12    nystatin -triamcinolone  (MYCOLOG II) cream Apply to affected area TID prn 30 g 0      Review of Systems  All systems reviewed and negative except as stated in HPI  PHYSICAL EXAM Blood pressure 138/70, pulse (!) 101, temperature 98.3 F (36.8 C), temperature source Oral, resp. rate 18, height 5' 2 (1.575 m), weight (!) 164.3 kg, last menstrual period 07/17/2023, unknown if currently breastfeeding. General appearance: alert and cooperative Lungs: respirations nonlabored Heart: regular rate 101 Abdomen: gravid  Fetal monitoring Baseline: 145 bpm, Variability: Good {> 6 bpm), Accelerations: Reactive, and Decelerations: Absent Uterine activity none    Presentation: cephalic   Prenatal labs: ABO, Rh: --/--/PENDING (12/22 2318) Antibody: PENDING (12/22 2318) Rubella: 1.48 (10/27 1123) RPR: Non Reactive (10/27 1005)  HBsAg: Negative (10/27 1123)  HIV: Non Reactive (10/27 1005)   Lab Results  Component Value Date   GBS Positive (A) 04/13/2024  Sono at [redacted]w[redacted]d: normal anatomy, cephalic presentation, posterior placenta, EFW 3783g, (>99%)  Immunization History  Administered Date(s) Administered   DTP 07/30/1991, 09/21/1991, 11/23/1991, 07/18/1992   DTaP 05/14/1995   Fluzone Influenza virus vaccine,trivalent (IIV3), split virus 05/05/2007   HIB, Unspecified 07/30/1991, 09/21/1991, 11/23/1991, 07/18/1992   HPV Quadrivalent 03/04/2006, 05/05/2007, 03/29/2008   Hep B, Unspecified 03-23-91, 07/30/1991, 01/26/1992   Hepatitis A, Ped/Adol-2 Dose 03/04/2006, 05/05/2007   Influenza Split 04/15/2012   Influenza, Seasonal, Injecte,  Preservative Fre 03/04/2006   Influenza-Unspecified 02/23/1992, 03/22/1992, 03/07/1993, 03/13/1994, 03/12/1995, 01/30/2023   MMR 07/18/1992, 05/14/1995   Meningococcal Conjugate 03/04/2006   Moderna Sars-Covid-2 Vaccination 07/06/2020   OPV 07/30/1991, 09/21/1991, 07/18/1992, 05/14/1995   PPD Test 08/25/2017, 11/01/2019   Pneumococcal Polysaccharide-23 11/29/2011   Tdap 12/25/2004, 07/31/2017, 01/27/2023, 04/06/2024    Prenatal Transfer Tool  Maternal Diabetes: Yes Genetic Screening: Normal Maternal Ultrasounds/Referrals: Normal Fetal Ultrasounds or other Referrals:  Other:  LGA 99% Maternal Substance Abuse:  No Significant Maternal Medications:  None Significant Maternal Lab Results: Group B Strep positive Number of Prenatal Visits:greater than 3 verified prenatal visits Maternal Vaccinations: declined Other Comments:  None   Results for orders placed or performed during the hospital encounter of 04/19/24 (from the past 24 hours)  Type and screen   Collection Time: 04/19/24 11:18 PM  Result Value Ref Range   ABO/RH(D) PENDING    Antibody Screen PENDING    Sample Expiration      04/22/2024,2359 Performed at [facility], [address], [place], [place] [address]     Patient Active Problem List   Diagnosis Date Noted   Uncontrolled diabetes mellitus with hyperglycemia (HCC) 04/19/2024   Short interval between pregnancies affecting pregnancy in third trimester, antepartum 03/21/2024   Large for gestational age fetus affecting mother, antepartum, third trimester, single gestation 03/05/2024   Gestational diabetes, diet controlled 03/01/2024   History of cesarean delivery 11/28/2023   History of gestational diabetes 11/28/2023   Supervision of high risk pregnancy, antepartum 10/23/2023   Pre-existing essential hypertension affecting pregnancy in third trimester 10/23/2023   PCOS (polycystic ovarian syndrome) 03/05/2021    ASSESSMENT & PLAN Olean C Grega is 33  y.o. [id number] with IUP at [redacted]w[redacted]d 05/10/2024, by Ultrasound admitted for diet controlled GDM well controlled on metformin  1000 mg BID. She received her prenatal care at [facility]. Pre-existing essential hypertension affecting pregnancy in third trimester. History of c/section for failed IOL at [facility]  Sono at [redacted]w[redacted]d: normal anatomy, cephalic presentation, posterior placenta, EFW 3783g, (>99%)  #Labor: TOLAC. Unable to place foley balloon at this time. Start pitocin  1X1. Will reassess in 2 hours. #Pain: Per patient preference, encourage ambulation #FWB: Cat 1 #GBS status:  Positive > PCN #Feeding: Breastmilk  and Formula #Reproductive Life planning: IUD  unsure of which #GDM: CBG Q4hr #LGA #Essential hypertension   Conard Me, SNM, RNC-OB Student Nurse Midwife 04/19/2024 11:56 PM   Evaluation and management procedures were performed by the Family Medicine Resident/PA or Medical Student/Student Nurse Midwife under my supervision. I was immediately available for direct supervision, assistance and direction throughout this encounter.  I also confirm that I have verified the information documented in the residents note, and that I have also personally reperformed the pertinent components of the physical exam and all of the medical decision making activities.  I have also made any necessary editorial changes.  Cathlean Ely, CNM [place] 04/20/2024 7:11 AM      [1]  Allergies Allergen Reactions  Other Anaphylaxis    Grapes, strawberries, bananas, pineapple, oranges, nuts   Peanut (Diagnostic) Anaphylaxis   Pineapple Anaphylaxis and Swelling   Betadine [Povidone Iodine] Itching    Betadine causes itching and skin redness and irritation   Povidone-Iodine Itching, Rash and Dermatitis    Betadine causes itching and skin redness and irritation   Pollen Extract Itching   Sulfa  Antibiotics Itching   Bee Pollen Itching   Iodine Itching   Latex  Itching   Tomato Itching and Rash   Tramadol  Nausea And Vomiting and Nausea Only

## 2024-04-20 ENCOUNTER — Encounter (HOSPITAL_COMMUNITY): Payer: Self-pay | Admitting: Obstetrics and Gynecology

## 2024-04-20 ENCOUNTER — Inpatient Hospital Stay (HOSPITAL_COMMUNITY): Admitting: Anesthesiology

## 2024-04-20 ENCOUNTER — Encounter (HOSPITAL_COMMUNITY): Payer: Self-pay | Admitting: Anesthesiology

## 2024-04-20 ENCOUNTER — Encounter (HOSPITAL_COMMUNITY)
Admission: RE | Disposition: A | Discharge status: Home / Self Care | Payer: Self-pay | Source: Home / Self Care | Attending: Family Medicine

## 2024-04-20 DIAGNOSIS — O34211 Maternal care for low transverse scar from previous cesarean delivery: Secondary | ICD-10-CM

## 2024-04-20 DIAGNOSIS — O24424 Gestational diabetes mellitus in childbirth, insulin controlled: Secondary | ICD-10-CM

## 2024-04-20 DIAGNOSIS — Z30430 Encounter for insertion of intrauterine contraceptive device: Secondary | ICD-10-CM

## 2024-04-20 DIAGNOSIS — O3663X0 Maternal care for excessive fetal growth, third trimester, not applicable or unspecified: Secondary | ICD-10-CM

## 2024-04-20 DIAGNOSIS — O1002 Pre-existing essential hypertension complicating childbirth: Secondary | ICD-10-CM

## 2024-04-20 DIAGNOSIS — Z3A37 37 weeks gestation of pregnancy: Secondary | ICD-10-CM

## 2024-04-20 DIAGNOSIS — O99820 Streptococcus B carrier state complicating pregnancy: Secondary | ICD-10-CM | POA: Insufficient documentation

## 2024-04-20 LAB — COMPREHENSIVE METABOLIC PANEL WITH GFR
ALT: 27 U/L (ref 0–44)
AST: 26 U/L (ref 15–41)
Albumin: 3.3 g/dL — ABNORMAL LOW (ref 3.5–5.0)
Alkaline Phosphatase: 115 U/L (ref 38–126)
Anion gap: 13 (ref 5–15)
BUN: 8 mg/dL (ref 6–20)
CO2: 20 mmol/L — ABNORMAL LOW (ref 22–32)
Calcium: 9.2 mg/dL (ref 8.9–10.3)
Chloride: 102 mmol/L (ref 98–111)
Creatinine, Ser: 0.46 mg/dL (ref 0.44–1.00)
GFR, Estimated: >60 mL/min
Glucose, Bld: 145 mg/dL — ABNORMAL HIGH (ref 70–99)
Potassium: 4.1 mmol/L (ref 3.5–5.1)
Sodium: 135 mmol/L (ref 135–145)
Total Bilirubin: <0.2 mg/dL (ref 0.0–1.2)
Total Protein: 6.9 g/dL (ref 6.5–8.1)

## 2024-04-20 LAB — CBC
HCT: 37 % (ref 36.0–46.0)
Hemoglobin: 12.2 g/dL (ref 12.0–15.0)
MCH: 28.8 pg (ref 26.0–34.0)
MCHC: 33 g/dL (ref 30.0–36.0)
MCV: 87.5 fL (ref 80.0–100.0)
Platelets: 305 K/uL (ref 150–400)
RBC: 4.23 MIL/uL (ref 3.87–5.11)
RDW: 13.5 % (ref 11.5–15.5)
WBC: 8.9 K/uL (ref 4.0–10.5)
nRBC: 0 % (ref 0.0–0.2)

## 2024-04-20 LAB — GLUCOSE, CAPILLARY
Glucose-Capillary: 93 mg/dL (ref 70–99)
Glucose-Capillary: 144 mg/dL — ABNORMAL HIGH (ref 70–99)
Glucose-Capillary: 160 mg/dL — ABNORMAL HIGH (ref 70–99)
Glucose-Capillary: 111 mg/dL — ABNORMAL HIGH (ref 70–99)
Glucose-Capillary: 76 mg/dL (ref 70–99)
Glucose-Capillary: 75 mg/dL (ref 70–99)

## 2024-04-20 LAB — TYPE AND SCREEN: ABO/RH(D): O POS

## 2024-04-20 LAB — SYPHILIS: RPR W/REFLEX TO RPR TITER AND TREPONEMAL ANTIBODIES, TRADITIONAL SCREENING AND DIAGNOSIS ALGORITHM: RPR Ser Ql: NONREACTIVE

## 2024-04-20 SURGERY — Surgical Case
Anesthesia: Epidural

## 2024-04-20 MED ORDER — MEASLES, MUMPS & RUBELLA VAC ~~LOC~~ SUSR: 0.5000 mL | Freq: Once | SUBCUTANEOUS | Status: DC

## 2024-04-20 MED ORDER — BUPIVACAINE IN DEXTROSE 0.75-8.25 % IT SOLN
INTRATHECAL | Status: DC | PRN
  Administered 2024-04-20: 1.7 mL via INTRATHECAL

## 2024-04-20 MED ORDER — MENTHOL 3 MG MT LOZG: 1.0000 | LOZENGE | OROMUCOSAL | Status: DC | PRN

## 2024-04-20 MED ORDER — WITCH HAZEL-GLYCERIN EX PADS: 1.0000 | MEDICATED_PAD | CUTANEOUS | Status: DC | PRN

## 2024-04-20 MED ORDER — ONDANSETRON HCL 4 MG/2ML IJ SOLN: 4.0000 mg | Freq: Once | INTRAMUSCULAR | Status: DC | PRN

## 2024-04-20 MED ORDER — DROPERIDOL 2.5 MG/ML IJ SOLN: 0.6250 mg | Freq: Once | INTRAMUSCULAR | Status: DC | PRN

## 2024-04-20 MED ORDER — LACTATED RINGERS IV SOLN: INTRAVENOUS | Status: DC | PRN

## 2024-04-20 MED ORDER — ONDANSETRON HCL 4 MG/2ML IJ SOLN
INTRAMUSCULAR | Status: DC | PRN
  Administered 2024-04-20: 4 mg via INTRAVENOUS

## 2024-04-20 MED ORDER — OXYTOCIN-SODIUM CHLORIDE 30-0.9 UT/500ML-% IV SOLN
INTRAVENOUS | Status: DC | PRN
  Administered 2024-04-20 (×2): 30 [IU] via INTRAVENOUS

## 2024-04-20 MED ORDER — FUROSEMIDE 20 MG PO TABS
20.0000 mg | ORAL_TABLET | Freq: Every day | ORAL | Status: DC
  Administered 2024-04-21 – 2024-04-22 (×2): 20 mg via ORAL
  Filled 2024-04-20 (×2): qty 1

## 2024-04-20 MED ORDER — PHENYLEPHRINE HCL-NACL 20-0.9 MG/250ML-% IV SOLN
INTRAVENOUS | Status: DC | PRN
  Administered 2024-04-20: 60 ug/min via INTRAVENOUS

## 2024-04-20 MED ORDER — DIPHENHYDRAMINE HCL 50 MG/ML IJ SOLN
INTRAMUSCULAR | Status: DC | PRN
  Administered 2024-04-20: 6.25 mg via INTRAVENOUS

## 2024-04-20 MED ORDER — TRANEXAMIC ACID-NACL 1000-0.7 MG/100ML-% IV SOLN
1000.0000 mg | INTRAVENOUS | Status: AC
  Administered 2024-04-20: 1000 mg via INTRAVENOUS

## 2024-04-20 MED ORDER — LACTATED RINGERS IV SOLN: INTRAVENOUS | Status: DC

## 2024-04-20 MED ORDER — DEXAMETHASONE SOD PHOSPHATE PF 10 MG/ML IJ SOLN
INTRAMUSCULAR | Status: DC | PRN
  Administered 2024-04-20: 10 mg via INTRAVENOUS

## 2024-04-20 MED ORDER — SODIUM CHLORIDE 0.9 % IR SOLN
Status: DC | PRN
  Administered 2024-04-20: 1

## 2024-04-20 MED ORDER — ENOXAPARIN SODIUM 80 MG/0.8ML IJ SOSY
80.0000 mg | PREFILLED_SYRINGE | INTRAMUSCULAR | Status: DC
  Administered 2024-04-21 – 2024-04-22 (×2): 80 mg via SUBCUTANEOUS
  Filled 2024-04-20 (×2): qty 0.8

## 2024-04-20 MED ORDER — FENTANYL CITRATE (PF) 100 MCG/2ML IJ SOLN
INTRAMUSCULAR | Status: DC | PRN
  Administered 2024-04-20: 15 ug via INTRATHECAL

## 2024-04-20 MED ORDER — IBUPROFEN 600 MG PO TABS: 600.0000 mg | ORAL_TABLET | Freq: Four times a day (QID) | ORAL | Status: DC

## 2024-04-20 MED ORDER — LEVONORGESTREL 20 MCG/DAY IU IUD
1.0000 | INTRAUTERINE_SYSTEM | Freq: Once | INTRAUTERINE | Status: AC
  Administered 2024-04-20: 1 via INTRAUTERINE
  Filled 2024-04-20: qty 1

## 2024-04-20 MED ORDER — MORPHINE SULFATE (PF) 0.5 MG/ML IJ SOLN
INTRAMUSCULAR | Status: DC | PRN
  Administered 2024-04-20: .15 mg via INTRATHECAL

## 2024-04-20 MED ORDER — NIFEDIPINE ER OSMOTIC RELEASE 30 MG PO TB24: 30.0000 mg | ORAL_TABLET | Freq: Every day | ORAL | Status: DC

## 2024-04-20 MED ORDER — METOCLOPRAMIDE HCL 5 MG/ML IJ SOLN
INTRAMUSCULAR | Status: DC | PRN
  Administered 2024-04-20: 5 mg via INTRAVENOUS

## 2024-04-20 MED ORDER — FENTANYL CITRATE (PF) 100 MCG/2ML IJ SOLN
INTRAMUSCULAR | Status: AC
  Administered 2024-04-20: 100 ug via INTRAVENOUS
  Filled 2024-04-20: qty 2

## 2024-04-20 MED ORDER — ZOLPIDEM TARTRATE 5 MG PO TABS
5.0000 mg | ORAL_TABLET | Freq: Every evening | ORAL | Status: DC | PRN
  Administered 2024-04-20: 5 mg via ORAL
  Filled 2024-04-20: qty 1

## 2024-04-20 MED ORDER — FENTANYL CITRATE (PF) 100 MCG/2ML IJ SOLN
INTRAMUSCULAR | Status: AC
  Filled 2024-04-20: qty 2

## 2024-04-20 MED ORDER — DIPHENHYDRAMINE HCL 25 MG PO CAPS
25.0000 mg | ORAL_CAPSULE | Freq: Four times a day (QID) | ORAL | Status: DC | PRN
  Administered 2024-04-21 (×2): 25 mg via ORAL
  Filled 2024-04-20: qty 1

## 2024-04-20 MED ORDER — PHENYLEPHRINE 80 MCG/ML (10ML) SYRINGE FOR IV PUSH (FOR BLOOD PRESSURE SUPPORT)
PREFILLED_SYRINGE | INTRAVENOUS | Status: DC | PRN
  Administered 2024-04-20: 160 ug via INTRAVENOUS

## 2024-04-20 MED ORDER — MEDROXYPROGESTERONE ACETATE 150 MG/ML IM SUSP: 150.0000 mg | INTRAMUSCULAR | Status: DC | PRN

## 2024-04-20 MED ORDER — CEFAZOLIN SODIUM-DEXTROSE 3-4 GM/150ML-% IV SOLN
3.0000 g | INTRAVENOUS | Status: AC
  Administered 2024-04-20: 3 g via INTRAVENOUS

## 2024-04-20 MED ORDER — ACETAMINOPHEN 325 MG PO TABS
650.0000 mg | ORAL_TABLET | ORAL | Status: DC | PRN
  Administered 2024-04-21 – 2024-04-22 (×4): 650 mg via ORAL
  Filled 2024-04-20 (×5): qty 2

## 2024-04-20 MED ORDER — SIMETHICONE 80 MG PO CHEW
80.0000 mg | CHEWABLE_TABLET | Freq: Three times a day (TID) | ORAL | Status: DC
  Administered 2024-04-21 – 2024-04-22 (×5): 80 mg via ORAL
  Filled 2024-04-20 (×5): qty 1

## 2024-04-20 MED ORDER — SIMETHICONE 80 MG PO CHEW
80.0000 mg | CHEWABLE_TABLET | ORAL | Status: DC | PRN
  Administered 2024-04-22: 80 mg via ORAL
  Filled 2024-04-20: qty 1

## 2024-04-20 MED ORDER — NIFEDIPINE ER OSMOTIC RELEASE 30 MG PO TB24
30.0000 mg | ORAL_TABLET | Freq: Every day | ORAL | Status: DC
  Administered 2024-04-20 – 2024-04-22 (×3): 30 mg via ORAL
  Filled 2024-04-20 (×3): qty 1

## 2024-04-20 MED ORDER — PENICILLIN G POT IN DEXTROSE 60000 UNIT/ML IV SOLN
3.0000 10*6.[IU] | INTRAVENOUS | Status: DC
  Administered 2024-04-20 (×3): 3 10*6.[IU] via INTRAVENOUS
  Filled 2024-04-20 (×2): qty 50

## 2024-04-20 MED ORDER — OXYTOCIN-SODIUM CHLORIDE 30-0.9 UT/500ML-% IV SOLN: 2.5000 [IU]/h | INTRAVENOUS | Status: AC

## 2024-04-20 MED ORDER — BUPIVACAINE HCL (PF) 0.75 % IJ SOLN: INTRAMUSCULAR | Status: DC | PRN

## 2024-04-20 MED ORDER — PRENATAL MULTIVITAMIN CH
1.0000 | ORAL_TABLET | Freq: Every day | ORAL | Status: DC
  Administered 2024-04-21 – 2024-04-22 (×2): 1 via ORAL
  Filled 2024-04-20 (×2): qty 1

## 2024-04-20 MED ORDER — COCONUT OIL OIL: 1.0000 | TOPICAL_OIL | Status: DC | PRN

## 2024-04-20 MED ORDER — KETOROLAC TROMETHAMINE 30 MG/ML IJ SOLN
30.0000 mg | Freq: Four times a day (QID) | INTRAMUSCULAR | Status: DC
  Administered 2024-04-20 – 2024-04-21 (×2): 30 mg via INTRAVENOUS
  Filled 2024-04-20 (×3): qty 1

## 2024-04-20 MED ORDER — MORPHINE SULFATE (PF) 0.5 MG/ML IJ SOLN
INTRAMUSCULAR | Status: AC
  Filled 2024-04-20: qty 10

## 2024-04-20 MED ORDER — DIBUCAINE (PERIANAL) 1 % EX OINT: 1.0000 | TOPICAL_OINTMENT | CUTANEOUS | Status: DC | PRN

## 2024-04-20 MED ORDER — SODIUM CHLORIDE 0.9 % IV SOLN
5.0000 10*6.[IU] | Freq: Once | INTRAVENOUS | Status: AC
  Administered 2024-04-20: 5 10*6.[IU] via INTRAVENOUS
  Filled 2024-04-20: qty 5

## 2024-04-20 MED ORDER — ACETAMINOPHEN 10 MG/ML IV SOLN
INTRAVENOUS | Status: DC | PRN
  Administered 2024-04-20: 1000 mg via INTRAVENOUS

## 2024-04-20 MED ORDER — POTASSIUM CHLORIDE CRYS ER 20 MEQ PO TBCR
20.0000 meq | EXTENDED_RELEASE_TABLET | Freq: Every day | ORAL | Status: DC
  Administered 2024-04-21 – 2024-04-22 (×2): 20 meq via ORAL
  Filled 2024-04-20 (×2): qty 1

## 2024-04-20 MED ORDER — STERILE WATER FOR IRRIGATION IR SOLN
Status: DC | PRN
  Administered 2024-04-20: 1

## 2024-04-20 MED ORDER — OXYCODONE HCL 5 MG PO TABS
5.0000 mg | ORAL_TABLET | ORAL | Status: DC | PRN
  Administered 2024-04-21 (×4): 10 mg via ORAL
  Administered 2024-04-21: 5 mg via ORAL
  Administered 2024-04-22 (×3): 10 mg via ORAL
  Filled 2024-04-20 (×6): qty 2
  Filled 2024-04-20: qty 1
  Filled 2024-04-20 (×2): qty 2

## 2024-04-20 MED ORDER — SENNOSIDES-DOCUSATE SODIUM 8.6-50 MG PO TABS
2.0000 | ORAL_TABLET | Freq: Every day | ORAL | Status: DC
  Administered 2024-04-21 – 2024-04-22 (×2): 2 via ORAL
  Filled 2024-04-20 (×2): qty 2

## 2024-04-20 MED ORDER — FENTANYL CITRATE (PF) 100 MCG/2ML IJ SOLN
25.0000 ug | INTRAMUSCULAR | Status: DC | PRN
  Administered 2024-04-20: 50 ug via INTRAVENOUS
  Administered 2024-04-20: 25 ug via INTRAVENOUS

## 2024-04-20 MED ORDER — DIPHENHYDRAMINE HCL 50 MG/ML IJ SOLN
INTRAMUSCULAR | Status: AC
  Filled 2024-04-20: qty 1

## 2024-04-20 MED ORDER — FENTANYL CITRATE (PF) 100 MCG/2ML IJ SOLN
100.0000 ug | INTRAMUSCULAR | Status: DC | PRN
  Administered 2024-04-20 (×3): 100 ug via INTRAVENOUS
  Filled 2024-04-20 (×3): qty 2

## 2024-04-20 MED ORDER — DEXTROSE 50 % IV SOLN: 0.0000 mL | INTRAVENOUS | Status: DC | PRN

## 2024-04-20 MED ORDER — INSULIN REGULAR(HUMAN) IN NACL 100-0.9 UT/100ML-% IV SOLN
INTRAVENOUS | Status: DC
  Filled 2024-04-20: qty 100

## 2024-04-20 MED ORDER — DEXTROSE IN LACTATED RINGERS 5 % IV SOLN: INTRAVENOUS | Status: DC

## 2024-04-20 SURGICAL SUPPLY — 31 items
CANISTER WOUND CARE 500ML ATS (WOUND CARE) IMPLANT
CHLORAPREP W/TINT 26 (MISCELLANEOUS) ×2 IMPLANT
CLAMP UMBILICAL CORD (MISCELLANEOUS) ×1 IMPLANT
CLOTH BEACON ORANGE TIMEOUT ST (SAFETY) ×1 IMPLANT
DERMABOND ADVANCED .7 DNX12 (GAUZE/BANDAGES/DRESSINGS) IMPLANT
DRESSING PREVENA PLUS CUSTOM (GAUZE/BANDAGES/DRESSINGS) IMPLANT
DRSG OPSITE POSTOP 4X10 (GAUZE/BANDAGES/DRESSINGS) ×1 IMPLANT
ELECTRODE REM PT RTRN 9FT ADLT (ELECTROSURGICAL) ×1 IMPLANT
EXTRACTOR VACUUM KIWI (MISCELLANEOUS) IMPLANT
EXTRACTOR VACUUM M CUP 4 TUBE (SUCTIONS) IMPLANT
GLOVE BIO SURGEON STRL SZ7.5 (GLOVE) ×1 IMPLANT
GLOVE BIOGEL PI IND STRL 7.0 (GLOVE) ×2 IMPLANT
GLOVE BIOGEL PI IND STRL 8 (GLOVE) ×1 IMPLANT
GOWN STRL REUS W/TWL LRG LVL3 (GOWN DISPOSABLE) ×2 IMPLANT
GOWN STRL REUS W/TWL XL LVL3 (GOWN DISPOSABLE) ×1 IMPLANT
KIT ABG SYR 3ML LUER SLIP (SYRINGE) IMPLANT
MAT PREVALON FULL STRYKER (MISCELLANEOUS) IMPLANT
NDL HYPO 25X5/8 SAFETYGLIDE (NEEDLE) IMPLANT
NEEDLE HYPO 25X5/8 SAFETYGLIDE (NEEDLE) IMPLANT
NS IRRIG 1000ML POUR BTL (IV SOLUTION) ×1 IMPLANT
PACK C SECTION WH (CUSTOM PROCEDURE TRAY) ×1 IMPLANT
PAD OB MATERNITY 4.3X12.25 (PERSONAL CARE ITEMS) ×1 IMPLANT
RTRCTR C-SECT PINK 25CM LRG (MISCELLANEOUS) ×1 IMPLANT
SUT MNCRL 0 VIOLET CTX 36 (SUTURE) ×2 IMPLANT
SUT VIC AB 0 CTX36XBRD ANBCTRL (SUTURE) ×1 IMPLANT
SUT VIC AB 2-0 CT1 TAPERPNT 27 (SUTURE) ×1 IMPLANT
SUT VIC AB 4-0 KS 27 (SUTURE) ×1 IMPLANT
SUTURE PLAIN GUT 2.0 ETHICON (SUTURE) IMPLANT
TOWEL OR 17X24 6PK STRL BLUE (TOWEL DISPOSABLE) ×1 IMPLANT
TRAY FOLEY W/BAG SLVR 14FR LF (SET/KITS/TRAYS/PACK) ×1 IMPLANT
WATER STERILE IRR 1000ML POUR (IV SOLUTION) ×1 IMPLANT

## 2024-04-20 NOTE — Op Note (Addendum)
 Cesarean Section Operative Note   Patient: Teresa [name]  Date of Procedure: 04/20/2024  Procedure: Repeat Low Transverse Cesarean and Mirena  IUD placement   Indications: Elective repeat  Pre-operative Diagnosis: desires repeat, declined IOL; contraception.   Post-operative Diagnosis: Same and IUD insertion: Mirena , Lot # [id number], Expiration date 02/27/26  TOLAC Candidate: Yes   Surgeon: Surgeons and Role:    * Ilean Norleen GAILS, MD - Primary  Assistants:  DEWAINE Marilynn Nest, DO - Assisting    * Jomarie Charlie LABOR, MD - Assisting  An experienced assistant was required given the standard of surgical care given the complexity of the case.  This assistant was needed for exposure, dissection, suctioning, retraction, instrument exchange, assisting with delivery with administration of fundal pressure, and for overall help during the procedure.   Anesthesia: epidural  Anesthesiologist: Jefm Garnette LABOR, MD   Antibiotics: Cefazolin    Estimated Blood Loss: 375 ml   Total IV Fluids: 1000 ml  Urine Output: 50 cc OF clear urine  Specimens: None   Complications: no complications   Indications: Teresa [name] is a 33 y.o. [id number] with an IUP [redacted]w[redacted]d presenting for unscheduled cesarean secondary to the indications listed above. Clinical course notable for change in desire of mode of delivery from vaginal to cesarean section, necessitating RLTCS.  The risks of cesarean section discussed with the patient included but were not limited to: bleeding which may require transfusion or reoperation; infection which may require antibiotics; injury to bowel, bladder, ureters or other surrounding organs; injury to the fetus; need for additional procedures including hysterectomy in the event of a life-threatening hemorrhage; placental abnormalities with subsequent pregnancies, incisional problems, thromboembolic phenomenon and other postoperative/anesthesia complications. The patient concurred with  the proposed plan, giving informed written consent for the procedure. Patient NPO status waived given urgency of case. Anesthesia and OR aware. Preoperative prophylactic antibiotics and SCDs ordered on call to the OR.   Findings: Viable infant in cephalic presentation, no nuchal cord present. Apgars 8, 8, . Weight 3760 g. Clear amniotic fluid. Normal placenta, three vessel cord. Normal uterus, Normal bilateral fallopian tubes, Normal bilateral ovaries. Mild adhesive diseases- mostly noted at fascia.  No uterine adhesions.    Procedure Details: A Time Out was held and the above information confirmed. The patient received intravenous antibiotics and had sequential compression devices applied to her lower extremities preoperatively. The patient was taken back to the operative suite where epidural anesthesia was administered. After induction of anesthesia, the patient was draped and prepped in the usual sterile manner and placed in a dorsal supine position with a leftward tilt. A low transverse skin incision was made with scalpel and carried down through the subcutaneous tissue to the fascia. Fascial incision was made and extended transversely. The fascia was separated from the underlying rectus tissue superiorly and inferiorly. The rectus muscles were separated in the midline bluntly and the peritoneum was entered bluntly with some difficulty to the adhesive disease.  An Alexis retractor was placed to aid in visualization of the uterus.  No uterine adhesions were noted.   A bladder flap was not developed. A low transverse uterine incision was made. The infant was successfully delivered from cephalic presentation with [facility] assistance.  The umbilical cord was not clamped. Cord ph was not sent, and cord blood was obtained for evaluation. The placenta was removed Intact and appeared normal.   A Mirena  IUD was then removed from it's packaging in a sterile manner.  The IUD was placed manually  at the uterine fundus and  the strings were passed through the cervical os with a Kelly clamp.   The uterine incision was closed with a layer running unlocked suture of 0-Monocryl. Due to ongoing bleeding a second layer of 0 Monocryl was placed in an imbricating fashion, after which there was excellent hemostasis. The abdomen and the pelvis were cleared of all clot and debris and the Thersia was removed. Hemostasis was confirmed on all surfaces.  The peritoneum was reapproximated using 2-0 vicryl . The fascia was then closed using 0 Vicryl in a running fashion. The subcutaneous layer was reapproximated with 2-0 plain gut suture. The skin was closed with a 4-0 vicryl subcuticular stitch. The patient tolerated the procedure well. Sponge, lap, instrument and needle counts were correct x 2. She was taken to the recovery room in stable condition.  Disposition: PACU - hemodynamically stable.    Signed: Charlie DELENA Courts, MD Family Medicine - Obstetrics Fellow, [facility] for [facility], [facility]

## 2024-04-20 NOTE — Progress Notes (Signed)
 Patient and family seen at bedside to discuss lotus birth. Had a lotus birth with her second baby who was delivered via C/S at [facility]. Had a conversation re: their experience and desire to repeat this process with this infant. Discussed that there is no written policy re: lotus births but it is strongly discouraged due to risk of infection. Ensured that infant would be cared for and seen by pediatricians who are aware of plan for a lotus birth. Also counseled that plan may change if infant needs to go to warmer for resuscitation prior to delivery of placenta. All questions answered at bedside.   Teresa DELENA Courts, MD

## 2024-04-20 NOTE — Progress Notes (Signed)
 Patient Vitals for the past 4 hrs:  BP Pulse SpO2  04/20/24 0615 -- -- 98 %  04/20/24 0543 139/78 (!) 106 --   Requesting IV pain meds. Ctx q 3-5 m inutes, FHR Cat 1. Pit at 6 mu/min.  Cx 1.5/long/-2/anterior. Cooks inserted and inflated w/40cc H20. Continue present mgt.

## 2024-04-20 NOTE — Progress Notes (Signed)
 Patient ID: Teresa [name], female   DOB: 08/01/1990, 32 y.o.   MRN: [id number] Blood pressure 138/86, pulse (!) 102, temperature 98.8 F (37.1 C), temperature source Oral, resp. rate 18, height 5' 2 (1.575 m), weight (!) 164.3 kg, last menstrual period 07/17/2023, SpO2 98%, unknown if currently breastfeeding.  Dilation: 1.5 Effacement (%): Thick Station: -2 Presentation: Vertex Exam by:: F. Cres-Dish, CNM  FHT: 145/moderate variability/+accels/-decels/ctx q18min  Patient and family seen at bedside re: RLTCS vs TOLAC. Discussed risks and benefits of both options, including but not limited to other medical indications for cesarean sections and recovery time. Discussed birth trauma and validated concerns. At the end of discussion, patient preferred to continue with TOLAC at this time. All questions answered at bedside. Plan to restart pitocin , foley balloon remains in place.  Charlie DELENA Courts, MD

## 2024-04-20 NOTE — Progress Notes (Signed)
 Patient ID: Teresa [name], female   DOB: [birth year], 33 y.o.   MRN: [id number]  Blood pressure 139/82, pulse (!) 101, temperature 98.8 F (37.1 C), temperature source Oral, resp. rate 18, height 5' 2 (1.575 m), weight (!) 164.3 kg, last menstrual period 07/17/2023, SpO2 98%, unknown if currently breastfeeding.  Dilation: 2 Effacement (%): 50 Station: -2 Presentation: Vertex Exam by:: KYM Eagles, RNC  FHT: 140/moderate variability/+accels/-decels/ctx q54min  Seen at bedside with nurse and Dr. Cresenzo as patient is requesting RLTCS. Notes that her labor progress isn't going as expected and would prefer to move forward with a section. SVE remains unchanged, foley balloon in place. Dr Ilean discussed risks of lotus birth and contraindications including fetal need for resuscitation prior to placental delivery and postpartum hemorrhage. Patient also desires Mirena  IUD to be placed in OR. Discussed risks and benefits of the procedure. Mirena  IUD ordered, to be available in OR for placement.   The risks of cesarean section discussed with the patient included but were not limited to: bleeding which may require transfusion or reoperation; infection which may require antibiotics; injury to bowel, bladder, ureters or other surrounding organs; injury to the fetus; need for additional procedures including hysterectomy in the event of a life-threatening hemorrhage; placental abnormalities with subsequent pregnancies, incisional problems, thromboembolic phenomenon and other postoperative/anesthesia complications. The patient concurred with the proposed plan, giving informed written consent for the procedure. Patient has been NPO since last night she will remain NPO for procedure. Anesthesia and OR aware. Preoperative prophylactic antibiotics and SCDs ordered on call to the OR.    Charlie DELENA Courts, MD

## 2024-04-20 NOTE — Anesthesia Preprocedure Evaluation (Addendum)
 "                                  Anesthesia Evaluation  Patient identified by MRN, date of birth, ID band Patient awake    Reviewed: Allergy & Precautions, NPO status , Patient's Chart, lab work & pertinent test results  Airway Mallampati: II  TM Distance: >3 FB     Dental no notable dental hx. (+) Teeth Intact, Dental Advisory Given   Pulmonary asthma , former smoker   Pulmonary exam normal breath sounds clear to auscultation       Cardiovascular hypertension, Pt. on medications Normal cardiovascular exam Rhythm:Regular Rate:Normal  Peripheral edema EKG 03/26/24 ST otherwise Normal   Neuro/Psych  Headaches PSYCHIATRIC DISORDERS  Depression    ADHD   GI/Hepatic Neg liver ROS,GERD  Medicated,,  Endo/Other  diabetes, Well Controlled, Gestational, Oral Hypoglycemic Agents  Class 4 obesityPCOS  Renal/GU negative Renal ROSLab Results      Component                Value               Date                      NA                       135                 04/20/2024                CL                       102                 04/20/2024                K                        4.1                 04/20/2024                CO2                      20 (L)              04/20/2024                BUN                      8                   04/20/2024                CREATININE               0.46                04/20/2024                GFRNONAA                 >60                 04/20/2024  CALCIUM                   9.2                 04/20/2024                ALBUMIN                  3.3 (L)             04/20/2024                GLUCOSE                  145 (H)             04/20/2024             negative genitourinary   Musculoskeletal negative musculoskeletal ROS (+)    Abdominal  (+) + obese  Peds  Hematology  (+) Blood dyscrasia, anemia Lab Results      Component                Value               Date                      WBC                       8.4                 04/19/2024                HGB                      11.9 (L)            04/19/2024                HCT                      36.6                04/19/2024                MCV                      88.6                04/19/2024                PLT                      316                 04/19/2024              Anesthesia Other Findings   Reproductive/Obstetrics (+) Pregnancy Hx/o Previous C/Section  LGA gDM PIH Hx/o Syphilis 2023 treated                              Anesthesia Physical Anesthesia Plan  ASA: 3 and emergent  Anesthesia Plan: Epidural   Post-op Pain Management: Minimal or no pain anticipated   Induction: Intravenous  PONV Risk Score and Plan: 4 or greater and Treatment may vary due to age or medical condition and Scopolamine patch - Pre-op  Airway Management  Planned: Natural Airway  Additional Equipment: Fetal Monitoring and None  Intra-op Plan:   Post-operative Plan:   Informed Consent: I have reviewed the patients History and Physical, chart, labs and discussed the procedure including the risks, benefits and alternatives for the proposed anesthesia with the patient or authorized representative who has indicated his/her understanding and acceptance.     Dental advisory given  Plan Discussed with: Anesthesiologist and CRNA  Anesthesia Plan Comments:          Anesthesia Quick Evaluation  "

## 2024-04-20 NOTE — Inpatient Diabetes Management (Signed)
 Inpatient Diabetes Program Recommendations  Diabetes Treatment Program Recommendations  ADA Standards of Care Diabetes in Pregnancy Target Glucose Ranges:  Fasting: 70 - 95 mg/dL 1 hr postprandial: Less than 140mg /dL (from first bite of meal) 2 hr postprandial: Less than 120 mg/dL (from first bite of meal)     Latest Reference Range & Units 03/20/24 22:34 04/20/24 05:25 04/20/24 09:38  Glucose-Capillary 70 - 99 mg/dL 842 (H) 93 839 (H)    Latest Reference Range & Units 04/12/24 12:33 04/20/24 01:48  Glucose 70 - 99 mg/dL 858 (H) 854 (H)   Review of Glycemic Control  Diabetes history: GDM; [redacted]W[redacted]D Outpatient Diabetes medications: Metformin  1000 mg BID Current orders for Inpatient glycemic control: None; CBGs Q4H  Inpatient Diabetes Program Recommendations:    Insulin : While in labor, please consider ordering Novolog 0-14 units Q4H.  Anticipate after delivery, patient will not need insulin  ordered but would check fasting CBG.  Thanks, Earnie Gainer, RN, MSN, CDCES Diabetes Coordinator Inpatient Diabetes Program [phone] (Team Pager from 8am to 5pm)

## 2024-04-20 NOTE — Transfer of Care (Signed)
 Immediate Anesthesia Transfer of Care Note  Patient: Teresa [name]  Procedure(s) Performed: CESAREAN DELIVERY  Patient Location: PACU  Anesthesia Type:Spinal and Epidural  Level of Consciousness: drowsy and patient cooperative  Airway & Oxygen Therapy: Patient Spontanous Breathing  Post-op Assessment: Report given to RN and Post -op Vital signs reviewed and stable  Post vital signs: Reviewed and stable  Last Vitals:  Vitals Value Taken Time  BP 118/67 04/20/24 19:15  Temp    Pulse 84 04/20/24 19:18  Resp 14 04/20/24 19:18  SpO2 98 % 04/20/24 19:18  Vitals shown include unfiled device data.  Last Pain:  Vitals:   04/20/24 1430  TempSrc:   PainSc: 4          Complications: No notable events documented.

## 2024-04-20 NOTE — Progress Notes (Signed)
 Labor Progress Note Teresa [name] is a 33 y.o. G3P2002 at [redacted]w[redacted]d presented for GDM  S:  Beginning to feel contractions  O:  BP 139/78   Pulse (!) 106   Temp 98.2 F (36.8 C) (Oral)   Resp 18   Ht 5' 2 (1.575 m)   Wt (!) 164.3 kg   LMP 07/17/2023 (Approximate)   BMI 66.25 kg/m   EFM: baseline 145 bpm/ moderate variability/ 15x15 accels/ early decels  Toco/IUPC: q 3 mins SVE: Dilation: Fingertip Effacement (%): Thick Exam by:: Conard Me Student Midwife Pitocin : 4 mu/min  A/P: 33 y.o. G3P2002 [redacted]w[redacted]d  1. Labor: Cervical exam same. Unable to place foley balloon. Continue to titrate pitocin  as necessary. 2. FWB: Cat 1 3. Pain: per patient preference. 4. GDM: q4h CBG 5. GBS +:  PCN   Anticipate VBAC.  Clarisa Danser VEAR Me, Student-MidWife 5:47 AM

## 2024-04-20 NOTE — Anesthesia Procedure Notes (Signed)
 Spinal  Patient location during procedure: OR  Staffing Authorized by: Jerrye Sharper, MD   Performed by: Jerrye Sharper, MD  Preanesthetic Checklist Completed: patient identified, IV checked, site marked, risks and benefits discussed, surgical consent, monitors and equipment checked, pre-op evaluation and timeout performed Spinal Block Patient position: sitting Prep: DuraPrep Patient monitoring: cardiac monitor, continuous pulse ox, blood pressure and heart rate Approach: midline Location: L3-4 Injection technique: catheter Needle Needle type: Tuohy and Spinocan  Needle gauge: 24 G Needle length: 12.7 cm Needle insertion depth (cm): 9 Catheter type: closed end flexible Catheter size: 19 g Catheter at skin depth (cm): 15 Assessment Events: CSF return  Additional Notes Epidural performed using LOR with air technique. No CSF, Heme or paresthesias. SAB performed through the epidural needle using 24ga Spinocan needle. CSF clear with free flow and no paresthesias. Local anesthetic and narcotics injected through the spinal needle and withdrawn. Epidural catheter threaded 5cm into the epidural space and the epidural needle was withdrawn. A sterile dressing was applied and the patient placed supine with LUD. The patient tolerated the procedure well and adequate sensory level was obtained.

## 2024-04-21 ENCOUNTER — Other Ambulatory Visit (HOSPITAL_COMMUNITY): Payer: Self-pay

## 2024-04-21 LAB — CBC
HCT: 33 % — ABNORMAL LOW (ref 36.0–46.0)
Hemoglobin: 10.8 g/dL — ABNORMAL LOW (ref 12.0–15.0)
MCH: 29 pg (ref 26.0–34.0)
MCHC: 32.7 g/dL (ref 30.0–36.0)
MCV: 88.5 fL (ref 80.0–100.0)
Platelets: 253 K/uL (ref 150–400)
RBC: 3.73 MIL/uL — ABNORMAL LOW (ref 3.87–5.11)
RDW: 13.4 % (ref 11.5–15.5)
WBC: 9.6 K/uL (ref 4.0–10.5)
nRBC: 0 % (ref 0.0–0.2)

## 2024-04-21 LAB — GLUCOSE, CAPILLARY: Glucose-Capillary: 93 mg/dL (ref 70–99)

## 2024-04-21 MED ORDER — SODIUM CHLORIDE 0.9% FLUSH: 3.0000 mL | INTRAVENOUS | Status: DC | PRN

## 2024-04-21 MED ORDER — FUROSEMIDE 20 MG PO TABS
20.0000 mg | ORAL_TABLET | Freq: Every day | ORAL | 0 refills | Status: DC
  Filled 2024-04-21: qty 5, 5d supply, fill #0

## 2024-04-21 MED ORDER — GABAPENTIN 100 MG PO CAPS
200.0000 mg | ORAL_CAPSULE | Freq: Two times a day (BID) | ORAL | 0 refills | Status: DC
  Filled 2024-04-21: qty 60, 15d supply, fill #0

## 2024-04-21 MED ORDER — HYDROMORPHONE HCL 1 MG/ML IJ SOLN
0.5000 mg | Freq: Once | INTRAMUSCULAR | Status: AC
  Administered 2024-04-21: 0.5 mg via INTRAVENOUS
  Filled 2024-04-21: qty 1

## 2024-04-21 MED ORDER — ACETAMINOPHEN 325 MG PO TABS
650.0000 mg | ORAL_TABLET | ORAL | 0 refills | Status: AC | PRN
  Filled 2024-04-21: qty 30, 3d supply, fill #0

## 2024-04-21 MED ORDER — KETOROLAC TROMETHAMINE 30 MG/ML IJ SOLN: 30.0000 mg | Freq: Four times a day (QID) | INTRAMUSCULAR | Status: AC | PRN

## 2024-04-21 MED ORDER — DIPHENHYDRAMINE HCL 50 MG/ML IJ SOLN: 12.5000 mg | INTRAMUSCULAR | Status: DC | PRN

## 2024-04-21 MED ORDER — SODIUM CHLORIDE 0.9 % IV SOLN
12.5000 mg | Freq: Once | INTRAVENOUS | Status: AC
  Administered 2024-04-21: 12.5 mg via INTRAVENOUS
  Filled 2024-04-21: qty 0.5

## 2024-04-21 MED ORDER — KETOROLAC TROMETHAMINE 30 MG/ML IJ SOLN
30.0000 mg | Freq: Four times a day (QID) | INTRAMUSCULAR | 0 refills | Status: DC
  Filled 2024-04-21: qty 1, 1d supply, fill #0

## 2024-04-21 MED ORDER — NALOXONE HCL 4 MG/10ML IJ SOLN: 1.0000 ug/kg/h | INTRAVENOUS | Status: DC | PRN

## 2024-04-21 MED ORDER — GABAPENTIN 100 MG PO CAPS
200.0000 mg | ORAL_CAPSULE | Freq: Two times a day (BID) | ORAL | Status: DC
  Administered 2024-04-21 – 2024-04-22 (×3): 200 mg via ORAL
  Filled 2024-04-21 (×3): qty 2

## 2024-04-21 MED ORDER — CYCLOBENZAPRINE HCL 10 MG PO TABS
10.0000 mg | ORAL_TABLET | Freq: Three times a day (TID) | ORAL | 0 refills | Status: DC | PRN
  Filled 2024-04-21: qty 30, 10d supply, fill #0

## 2024-04-21 MED ORDER — POTASSIUM CHLORIDE CRYS ER 20 MEQ PO TBCR
20.0000 meq | EXTENDED_RELEASE_TABLET | Freq: Every day | ORAL | 0 refills | Status: DC
  Filled 2024-04-21: qty 5, 5d supply, fill #0

## 2024-04-21 MED ORDER — CYCLOBENZAPRINE HCL 10 MG PO TABS
10.0000 mg | ORAL_TABLET | Freq: Three times a day (TID) | ORAL | Status: DC | PRN
  Administered 2024-04-21: 10 mg via ORAL
  Filled 2024-04-21: qty 1

## 2024-04-21 MED ORDER — IBUPROFEN 600 MG PO TABS
600.0000 mg | ORAL_TABLET | Freq: Four times a day (QID) | ORAL | Status: DC
  Administered 2024-04-21 – 2024-04-22 (×4): 600 mg via ORAL
  Filled 2024-04-21 (×4): qty 1

## 2024-04-21 MED ORDER — SODIUM CHLORIDE 0.9 % IV SOLN: 12.5000 mg | Freq: Once | INTRAVENOUS | Status: DC

## 2024-04-21 MED ORDER — NALBUPHINE HCL 10 MG/ML IJ SOLN
5.0000 mg | Freq: Once | INTRAMUSCULAR | Status: AC
  Administered 2024-04-21: 5 mg via INTRAMUSCULAR
  Filled 2024-04-21: qty 1

## 2024-04-21 MED ORDER — NALOXONE HCL 0.4 MG/ML IJ SOLN: 0.4000 mg | INTRAMUSCULAR | Status: DC | PRN

## 2024-04-21 MED ORDER — DIPHENHYDRAMINE HCL 25 MG PO CAPS
25.0000 mg | ORAL_CAPSULE | ORAL | Status: DC | PRN
  Filled 2024-04-21: qty 1

## 2024-04-21 MED ORDER — OXYCODONE HCL 5 MG PO TABS
5.0000 mg | ORAL_TABLET | ORAL | 0 refills | Status: DC | PRN
  Filled 2024-04-21: qty 30, 3d supply, fill #0

## 2024-04-21 MED ORDER — ONDANSETRON HCL 4 MG/2ML IJ SOLN
4.0000 mg | Freq: Three times a day (TID) | INTRAMUSCULAR | Status: DC | PRN
  Administered 2024-04-21: 4 mg via INTRAVENOUS
  Filled 2024-04-21: qty 2

## 2024-04-21 MED ORDER — IBUPROFEN 600 MG PO TABS
600.0000 mg | ORAL_TABLET | Freq: Four times a day (QID) | ORAL | 0 refills | Status: DC
  Filled 2024-04-21: qty 30, 8d supply, fill #0

## 2024-04-21 NOTE — Clinical Social Work Maternal (Signed)
 " CLINICAL SOCIAL WORK MATERNAL/CHILD NOTE  Patient Details  Name: Teresa [name] MRN: [id number] Date of Birth: [birth year]  Date:  04/21/2024  Clinical Social Worker Initiating Note:  Rosina Molt Date/Time: Initiated:  04/21/24/1409     Child's Name:  Teresa [name]   Biological Parents:  Mother, Father Leah Perkin 12[birth year]20[birth year]1990 Jock [name])   Need for Interpreter:  None   Reason for Referral:  Behavioral Health Concerns, Current Substance Use/Substance Use During Pregnancy     Address:  [address] [place] [address]    Phone number:  [phone] (home)     Additional phone number:   Household Members/Support Persons (HM/SP):   Household Member/Support Zieske 1, Household Member/Support Magner 2   HM/SP Name Relationship DOB or Age  HM/SP -1 Malani McGowan Daughter 58 years old  HM/SP -2 Teresa Shoun Daughter 51 years old  HM/SP -3        HM/SP -4        HM/SP -5        HM/SP -6        HM/SP -7        HM/SP -8          Natural Supports (not living in the home):  Immediate Family   Professional Supports: None   Employment: Unemployed, Consulting Civil Engineer   Type of Work:     Education:  Attending college   Homebound arranged:    Surveyor, Quantity Resources:  Medicaid   Other Resources:  Sales Executive  , WIC   Cultural/Religious Considerations Which May Impact Care:    Strengths:  Ability to meet basic needs  , Understanding of illness, Home prepared for child  , Pediatrician chosen   Psychotropic Medications:         Pediatrician:    [employer] area  Pediatrician List:   [facility]  [place]    [place]    [facility]      Pediatrician Fax Number:    Risk Factors/Current Problems:  Substance Use  , Mental Health Concerns     Cognitive State:  Alert  , Able to Concentrate  , Linear Thinking  , Insightful  , Goal Oriented     Mood/Affect:  Calm  , Comfortable   , Interested  , Relaxed     CSW Assessment: CSW received a consult due to a drug exposed newborn; and met MOB at bedside to complete a full psychosocial assessment. CSW entered the room, introduced herself and acknowledged that FOB was present. CSW asked MOB for privacy reasons could FOB stepout for the assessment; MOB was agreeable and FOB stepped out for the assessment. CSW explained her role and the reason for the visit.  MOB presented bonding/feeding the infant as they laid safely in the bed. MOB was polite, easy to engage, receptive to meeting with CSW, and appeared forthcoming.  CSW collected MOB's demographic information and she reported CPS hx with Teresa [name] 33 years old. 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 MOB reported the case was open for one year in 2016 , she completed a case plan while Teresa resided with her aunt. MOB reported once the case was closed, her and Teresa relocated out of [place] . MOB denied any additional cases with CPS.   CSW inquired about MOB's mental health history. MOB reported prior to her pregnancies she  was diagnosed with Anxiety, Depression and Bipolar. MOB described her Bipolar as Depressive and her last episode was over 5 years ago. MOB reported in the past participating in therapy and was prescribed medication however she denied current supportive services. CSW asked MOB would therapy resources be beneficial for this current PP period; MOB declined therapy resources. CSW asked MOB how she is currently feeling since the birth of the infant. MOB reported currently her mental health is stable and she is feeling good. CSW provided education regarding the baby blues period vs. perinatal mood disorders, discussed treatment and gave resources for mental health follow up if concerns arise.  CSW recommends self-evaluation during the postpartum time period using the New Mom Checklist  from Postpartum Progress and encouraged MOB to contact a medical professional if symptoms are noted at any time. CSW assessed for safety with MOB SI/HI/DV;MOB denied all.  CSW asked MOB does she receive support resources; MOB said yes(WIC and food stamps). MOB reported having all essential items for the infant including a carseat, bassinet and crib for safe sleeping. CSW provided review of Sudden Infant Death Syndrome (SIDS) precautions.   CSW informed MOB due to [facility] during her pregnancy; the hospital will perform a UDS and CDS on the infant. If the screenings return with positive results a report to CPS will be made; MOB was understanding. CSW informed MOB due to the infant testing positive for fentanyl  a CPS report will be completed; however the report will be noted about the epidural used during L&D; MOB was understanding. MOB reported her last use of THC was in June and it was used for relaxation purposes. MOB denied use of any/ all substances during pregnancy.  CSW will complete a CPS report due the infant UDS testing positive for fentanyl  due to the epidural given during L&D. CSW will continue to monitor the CDS and complete a CPS report if warranted.   CSW Plan/Description:  No Further Intervention Required/No Barriers to Discharge, Sudden Infant Death Syndrome (SIDS) Education, Perinatal Mood and Anxiety Disorder (PMADs) Education, Hospital Drug Screen Policy Information, Other Information/Referral to [facility], CSW Will Continue to Monitor Umbilical Cord Tissue Drug Screen Results and Make Report if Ranelle Rosina MARLA Joshua, LCSW 04/21/2024, 2:12 PM  "

## 2024-04-21 NOTE — Progress Notes (Signed)
 POD1 s/p RLTCS  Subjective: up ad lib, voiding, tolerating PO, and + flatus. Notes pain is not well controlled today. She reports itching.   Objective: Blood pressure (!) 124/56, pulse 97, temperature 98.3 F (36.8 C), temperature source Oral, resp. rate 16, height 5' 2 (1.575 m), weight (!) 164.3 kg, last menstrual period 07/17/2023, SpO2 99%, unknown if currently breastfeeding.  Physical Exam:  General: alert, cooperative, and no distress Lochia: appropriate Uterine Fundus: unable to palpate Incision: Prevena DVT Evaluation: No evidence of DVT seen on physical exam.  Recent Labs    04/20/24 1037 04/21/24 0437  HGB 12.2 10.8*  HCT 37.0 33.0*    Assessment/Plan: Postpartum - Contraception: ppIUD - MOF: Breast - Rh status: Positive - Rubella status: Immune - Dispo: She is hoping for d/c on POD2 - Consults: Lactation - Will adjust pain medications. Will also order binder.  - Postop hgb is appropriate. No iron needed.   GDM on MTF prior to delivery  - Fasting glucose was 93 - Would not continue medication  CHTN - Continue nifed 30 daily and Lasix /K. BP very well controlled.   Neonatal - Doing well   LOS: 2 days   Vina Solian 04/21/2024, 11:59 AM

## 2024-04-21 NOTE — Patient Instructions (Signed)
 If you are interested in an outpatient lactation consultation -- available in-office or virtually -- please reach out to us  at:  [facility] (First Floor) ?? [address], [place], [place]  ?? [phone] Please leave a message on our lactation voicemail box. We welcome any lactation-related questions or concerns -- our team is here to support you and your baby.  Lactation Support Groups Join us  at: [facility] ?? Tuesdays, 10:00 AM - 12:00 PM ?? [address], [place] [employer], [place]  Lactating parents and lap babies are welcome!  ?? [email or web address]  ?? [email or web address] -------------     Teresa [name], IBCLC [facility]

## 2024-04-21 NOTE — Anesthesia Postprocedure Evaluation (Signed)
"   Anesthesia Post Note  Patient: Teresa [name]  Procedure(s) Performed: CESAREAN DELIVERY     Patient location during evaluation: Mother Baby Anesthesia Type: Epidural Level of consciousness: oriented and awake and alert Pain management: pain level controlled Vital Signs Assessment: post-procedure vital signs reviewed and stable Respiratory status: spontaneous breathing and respiratory function stable Cardiovascular status: blood pressure returned to baseline and stable Postop Assessment: no headache, no backache, no apparent nausea or vomiting and able to ambulate Anesthetic complications: no   No notable events documented.  Last Vitals:  Vitals:   04/21/24 0020 04/21/24 0103  BP: 120/78 129/61  Pulse: 97 94  Resp: 16 18  Temp: 37.2 C 36.5 C  SpO2: 94% 94%    Last Pain:  Vitals:   04/21/24 0603  TempSrc:   PainSc: 10-Worst pain ever   Pain Goal:                   Garnette DELENA Gab      "

## 2024-04-21 NOTE — Lactation Note (Signed)
 This note was copied from a baby's chart. Lactation Consultation Note  Patient Name: Teresa [name] Today's Date: 04/21/2024 Age:33 hours Reason for consult: Initial assessment;Early term 37-38.6wks.  P3, MOB attempted latch infant on her left breast with pillow support using the football hold position, infant breifly latch 2 minutes and then stopped, afterwards infant was given 10 mls of 20 kcal formula. MOB feeding choice is breast and formula feeding infant. MOB had latch difficulties with her 2nd child and stopped breastfeeding at 2 weeks postpartum. MOB would like to breastfeed with her 3 rd child. MOB will continue to breastfeed infant by cues, on demand, 8-12 times within 24 hours, skin to skin. MOB will call for further latch assistance if needed. LC discussed importance of maternal rest, meals and hydration. MOB was  made aware of O/P services, breastfeeding support groups, community resources, and our phone # for post-discharge questions.    MOB was given hand out  Feeding Guidelines. MOB informed LC she has two DEBP's at home.   Maternal Data Has patient been taught Hand Expression?: Yes Does the patient have breastfeeding experience prior to this delivery?: Yes How long did the patient breastfeed?: Per MOB, she briefly breastfeed her 1st child for 2 weeks.  Feeding Mother's Current Feeding Choice: Breast Milk and Formula  LATCH Score Latch: Too sleepy or reluctant, no latch achieved, no sucking elicited.  Audible Swallowing: None  Type of Nipple: Everted at rest and after stimulation  Comfort (Breast/Nipple): Soft / non-tender  Hold (Positioning): Assistance needed to correctly position infant at breast and maintain latch.  LATCH Score: 5   Lactation Tools Discussed/Used    Interventions Interventions: Breast feeding basics reviewed;Assisted with latch;Skin to skin;Breast compression;Adjust position;Support pillows;Position options;Expressed  milk;DEBP;Education;Pace feeding;Guidelines for Milk Supply and Pumping Schedule Handout;Infant Driven Feeding Algorithm education;CDC milk storage guidelines;CDC Guidelines for Breast Pump Cleaning  Discharge Pump: Hands Free;Personal;DEBP  Consult Status Consult Status: Follow-up Date: 04/21/24 Follow-up type: In-patient    Grayce LULLA Batter 04/21/2024, 12:07 AM

## 2024-04-22 ENCOUNTER — Encounter (HOSPITAL_COMMUNITY): Payer: Self-pay | Admitting: Family Medicine

## 2024-04-22 DIAGNOSIS — Z975 Presence of (intrauterine) contraceptive device: Secondary | ICD-10-CM

## 2024-04-22 LAB — GLUCOSE, CAPILLARY: Glucose-Capillary: 104 mg/dL — ABNORMAL HIGH (ref 70–99)

## 2024-04-22 MED ORDER — CALCIUM CARBONATE ANTACID 500 MG PO CHEW
400.0000 mg | CHEWABLE_TABLET | Freq: Two times a day (BID) | ORAL | Status: DC | PRN
  Filled 2024-04-22: qty 2

## 2024-04-22 NOTE — Discharge Summary (Signed)
 "  Postpartum Discharge Summary     Patient Name: Teresa [name] DOB: [birth year] MRN: [id number]  Date of admission: 04/19/2024 Delivery date:04/20/2024 Delivering provider: ILEAN NORLEEN GAILS Date of discharge: 04/22/2024  Admitting diagnosis: Uncontrolled diabetes mellitus with hyperglycemia (HCC) [E11.65] Intrauterine pregnancy: [redacted]w[redacted]d     Secondary diagnosis:  Principal Problem:   Uncontrolled diabetes mellitus with hyperglycemia (HCC) Active Problems:   Supervision of high risk pregnancy, antepartum   Pre-existing essential hypertension affecting pregnancy in third trimester   History of cesarean delivery   Gestational diabetes, diet controlled   Large for gestational age fetus affecting mother, antepartum, third trimester, single gestation   Short interval between pregnancies affecting pregnancy in third trimester, antepartum   GBS (group B Streptococcus carrier), +RV culture, currently pregnant   IUD (intrauterine device) in place  Additional problems:     Discharge diagnosis: Term Pregnancy Delivered, CHTN, and GDM A2                                              Post partum procedures:post placental IUD Augmentation: Pitocin  and IP Foley Complications: None  Hospital course: Induction of Labor With Cesarean Section   33 y.o. yo G3P3003 at [redacted]w[redacted]d was admitted to the hospital 04/19/2024 for induction of labor. Patient had a labor course significant for TOLAC with change of heart during IOL. The patient went for cesarean section due to Elective Repeat. Delivery details are as follows: Membrane Rupture Time/Date: 6:00 PM,04/20/2024  Delivery Method:C-Section, Low Transverse Operative Delivery:N/A Details of operation can be found in separate operative Note.  Patient had a postpartum course complicated by: n/a. She is ambulating, tolerating a regular diet, passing flatus, and urinating well.  Patient is discharged home in stable condition on 04/22/2024.      Newborn Data: Birth  date:04/20/2024 Birth time:6:02 PM Gender:Female Living status:Living Apgars:8 ,8  Weight:3760 g                               Magnesium  Sulfate received: No BMZ received: No Rhophylac:N/A MMR:N/A T-DaP:declined Flu: declined RSV Vaccine received: declined Transfusion:No  Immunizations received: Immunization History  Administered Date(s) Administered   DTP 07/30/1991, 09/21/1991, 11/23/1991, 07/18/1992   DTaP 05/14/1995   Fluzone Influenza virus vaccine,trivalent (IIV3), split virus 05/05/2007   HIB, Unspecified 07/30/1991, 09/21/1991, 11/23/1991, 07/18/1992   HPV Quadrivalent 03/04/2006, 05/05/2007, 03/29/2008   Hep B, Unspecified Mar 08, 1991, 07/30/1991, 01/26/1992   Hepatitis A, Ped/Adol-2 Dose 03/04/2006, 05/05/2007   Influenza Split 04/15/2012   Influenza, Seasonal, Injecte, Preservative Fre 03/04/2006   Influenza-Unspecified 02/23/1992, 03/22/1992, 03/07/1993, 03/13/1994, 03/12/1995, 01/30/2023   MMR 07/18/1992, 05/14/1995   Meningococcal Conjugate 03/04/2006   Moderna Sars-Covid-2 Vaccination 07/06/2020   OPV 07/30/1991, 09/21/1991, 07/18/1992, 05/14/1995   PPD Test 08/25/2017, 11/01/2019   Pneumococcal Polysaccharide-23 11/29/2011   Tdap 12/25/2004, 07/31/2017, 01/27/2023, 04/06/2024    Physical exam  Vitals:   04/21/24 0103 04/21/24 0609 04/21/24 2136 04/22/24 0417  BP: 129/61 (!) 124/56 123/71 121/70  Pulse: 94 97 98 92  Resp: 18 16 16 18   Temp: 97.7 F (36.5 C) 98.3 F (36.8 C) 98.5 F (36.9 C) 98.3 F (36.8 C)  TempSrc: Oral Oral Axillary Oral  SpO2: 94% 99%    Weight:      Height:       General: alert, cooperative, and no distress  Lochia: appropriate Uterine Fundus: firm Incision: prevena in place with good seal DVT Evaluation: No evidence of DVT seen on physical exam. No significant calf/ankle edema. Labs: Lab Results  Component Value Date   WBC 9.6 04/21/2024   HGB 10.8 (L) 04/21/2024   HCT 33.0 (L) 04/21/2024   MCV 88.5 04/21/2024    PLT 253 04/21/2024      Latest Ref Rng & Units 04/20/2024    1:48 AM  CMP  Glucose 70 - 99 mg/dL 854   BUN 6 - 20 mg/dL 8   Creatinine 9.55 - 8.99 mg/dL 9.53   Sodium 864 - 854 mmol/L 135   Potassium 3.5 - 5.1 mmol/L 4.1   Chloride 98 - 111 mmol/L 102   CO2 22 - 32 mmol/L 20   Calcium  8.9 - 10.3 mg/dL 9.2   Total Protein 6.5 - 8.1 g/dL 6.9   Total Bilirubin 0.0 - 1.2 mg/dL <9.7   Alkaline Phos 38 - 126 U/L 115   AST 15 - 41 U/L 26   ALT 0 - 44 U/L 27    Edinburgh Score:    04/21/2024   11:59 PM  Edinburgh Postnatal Depression Scale Screening Tool  I have been able to laugh and see the funny side of things. 0  I have looked forward with enjoyment to things. 0  I have blamed myself unnecessarily when things went wrong. 0  I have been anxious or worried for no good reason. 0  I have felt scared or panicky for no good reason. 0  Things have been getting on top of me. 0  I have been so unhappy that I have had difficulty sleeping. 0  I have felt sad or miserable. 0  I have been so unhappy that I have been crying. 0  The thought of harming myself has occurred to me. 0  Edinburgh Postnatal Depression Scale Total 0   Edinburgh Postnatal Depression Scale Total: 0   After visit meds:  Allergies as of 04/22/2024       Reactions   Other Anaphylaxis   Grapes, strawberries, bananas, pineapple, oranges, nuts   Peanut (diagnostic) Anaphylaxis   Pineapple Anaphylaxis, Swelling   Betadine [povidone Iodine] Itching   Betadine causes itching and skin redness and irritation   Povidone-iodine Itching, Rash, Dermatitis   Betadine causes itching and skin redness and irritation   Pollen Extract Itching   Sulfa  Antibiotics Itching   Bee Pollen Itching   Iodine Itching   Latex Itching   Tomato Itching, Rash   Tramadol  Nausea And Vomiting, Nausea Only        Medication List     STOP taking these medications    Accu-Chek Softclix Lancets lancets   aspirin  EC 81 MG tablet    Blood Glucose Monitoring Suppl Devi   glucose blood test strip   metFORMIN  500 MG tablet Commonly known as: GLUCOPHAGE        TAKE these medications    acetaminophen  325 MG tablet Commonly known as: TYLENOL  Take 2 tablets (650 mg total) by mouth every 4 (four) hours as needed for mild pain (pain score 1-3) (temperature > 101.5.). What changed:  medication strength how much to take when to take this reasons to take this   cyclobenzaprine  10 MG tablet Commonly known as: FLEXERIL  Take 1 tablet (10 mg total) by mouth 3 (three) times daily as needed for muscle spasms. What changed: when to take this   furosemide  20 MG tablet Commonly known as: LASIX  Take  1 tablet (20 mg total) by mouth daily.   gabapentin  100 MG capsule Commonly known as: NEURONTIN  Take 2 capsules (200 mg total) by mouth 2 (two) times daily.   ibuprofen  600 MG tablet Commonly known as: ADVIL  Take 1 tablet (600 mg total) by mouth every 6 (six) hours.   NIFEdipine  30 MG 24 hr tablet Commonly known as: ADALAT  CC Take 1 tablet (30 mg total) by mouth daily.   nystatin -triamcinolone  cream Commonly known as: MYCOLOG II Apply to affected area TID prn   oxyCODONE  5 MG immediate release tablet Commonly known as: Oxy IR/ROXICODONE  Take 1-2 tablets (5-10 mg total) by mouth every 4 (four) hours as needed for moderate pain (pain score 4-6).   potassium chloride  SA 20 MEQ tablet Commonly known as: KLOR-CON  M Take 1 tablet (20 mEq total) by mouth daily.   Prenatal Complete 14-0.4 MG Tabs Take 1 tablet by mouth daily.               Discharge Care Instructions  (From admission, onward)           Start     Ordered   04/22/24 0000  Discharge wound care:       Comments: You may remove the honeycomb when your baby is 36 days old by carefully working up both edges and then pulling both ends apart horizontally. The honeycomb is designed to gently release from the incision when removed in this fashion.    04/22/24 1044             Discharge home in stable condition Infant Feeding: Bottle and Breast Infant Disposition:home with mother Discharge instruction: per After Visit Summary and Postpartum booklet. Activity: Advance as tolerated. Pelvic rest for 6 weeks.  Diet: routine diet Future Appointments: Future Appointments  Date Time Provider Department Center  05/17/2024  3:15 PM Ilean Norleen GAILS, MD [facility]   Follow up Visit:  Message sent to [facility] 12/25  Please schedule this patient for a [place] postpartum visit in 6 weeks with the following provider: Any provider. Additional Postpartum F/U:2 hour GTT, Incision check 1 week, and BP check 1 week  High risk pregnancy complicated by: GDM, HTN, and short interval pregnancy Delivery mode:  C-Section, Low Transverse Anticipated Birth Control:  PP IUD placed   04/22/2024 Donnice CHRISTELLA Carolus, MD    "

## 2024-04-22 NOTE — Lactation Note (Signed)
 This note was copied from a baby's chart. Lactation Consultation Note  Patient Name: Teresa [name] Today's Date: 04/22/2024 Age:33 hours Reason for consult: Follow-up assessment;Early term 37-38.6wks;Maternal endocrine disorder  P3- MOB reports that infant will not latch to her breasts just like her other children. LC offered to assist with a latch at this time, but MOB declined and stated that she was almost done giving infant formula. LC encouraged MOB to call for infant's feeding tonight for latch assistance. LC encouraged MOB to pump every 3 hrs if infant does not latch so we can ensure proper breast stimulation. MOB agreed to this. LC offered to set up the hospital DEBP for her, but she declined and reported wanting to use her hands free pump. When [facility] provided education on the poor suction of those pumps, especially for MOB's breasts size/shape, MOB still insisted on using them. LC reviewed cluster feeding and encouraged MOB to call for further assistance as needed.  Maternal Data Has patient been taught Hand Expression?: No Does the patient have breastfeeding experience prior to this delivery?: Yes  Feeding Mother's Current Feeding Choice: Breast Milk and Formula Nipple Type: Slow - flow  Lactation Tools Discussed/Used Pump Education: Milk Storage  Interventions Interventions: Breast feeding basics reviewed;Education;LC Services brochure  Discharge Discharge Education: Engorgement and breast care;Warning signs for feeding baby Pump: DEBP;Hands Free;Personal  Consult Status Consult Status: Follow-up Date: 04/23/24 Follow-up type: In-patient    Recardo Hoit BS, IBCLC 04/22/2024, 2:30 PM

## 2024-04-27 ENCOUNTER — Telehealth (HOSPITAL_COMMUNITY): Payer: Self-pay | Admitting: *Deleted

## 2024-04-27 ENCOUNTER — Encounter: Admitting: Obstetrics and Gynecology

## 2024-04-27 NOTE — Telephone Encounter (Signed)
 04/27/2024  Name: Meredith Kilbride Flammia MRN: [id number] DOB: [birth year]  Reason for Call:  Transition of Care Hospital Discharge Call  Contact Status: Patient Contact Status: Message  Language assistant needed:          Follow-Up Questions:    [place] Postnatal Depression Scale:  In the Past 7 Days:    PHQ2-9 Depression Scale:     Discharge Follow-up:    Post-discharge interventions: NA  Sofi Bryars,RN  04/27/2024 [id number]

## 2024-04-30 ENCOUNTER — Ambulatory Visit: Payer: Self-pay

## 2024-04-30 ENCOUNTER — Other Ambulatory Visit: Payer: Self-pay

## 2024-04-30 VITALS — BP 146/90 | HR 115 | Wt 343.0 lb

## 2024-04-30 DIAGNOSIS — Z4889 Encounter for other specified surgical aftercare: Secondary | ICD-10-CM

## 2024-04-30 MED ORDER — OXYCODONE HCL 5 MG PO TABS: 5.0000 mg | ORAL_TABLET | ORAL | 0 refills | Status: DC | PRN

## 2024-04-30 NOTE — BH Specialist Note (Addendum)
 "   Integrated Behavioral Health via Telemedicine Visit  05/25/2024 Teresa [name] [id number]  Number of Integrated Behavioral Health Clinician visits: 1- Initial Visit  Session Start time: 0917   Session End time: 0929  Total time in minutes: 12   Referring Provider: Norleen Rover, MD Patient/Family location: Home [facility] Provider location: Center for [facility] at [facility]  All persons participating in visit: Patient Teresa [name] and [facility] Ericca Labra   Types of Service: Individual psychotherapy and Telephone visit  I connected with Patsie C Digilio and/or Raven C Kyler's n/a via  Telephone or Video Enabled Telemedicine Application  (Video is Caregility application) and verified that I am speaking with the correct [place] using two identifiers. Discussed confidentiality: Yes   I discussed the limitations of telemedicine and the availability of in [place] appointments.  Discussed there is a possibility of technology failure and discussed alternative modes of communication if that failure occurs.  I discussed that engaging in this telemedicine visit, they consent to the provision of behavioral healthcare and the services will be billed under their insurance.  Patient and/or legal guardian expressed understanding and consented to Telemedicine visit: Yes   Presenting Concerns: Patient and/or family reports the following symptoms/concerns: Adjusting to fatigue and lack of quality sleep; good appetite and good support at home; no mood concerns at this time; no other questions or concerns today.  Duration of problem: Postpartum; Severity of problem: mild  Patient and/or Family's Strengths/Protective Factors: Social connections, Concrete supports in place (healthy food, safe environments, etc.), and Sense of purpose  Goals Addressed: Patient will:   Increase knowledge and/or ability of: healthy habits   Demonstrate ability to: Increase healthy  adjustment to current life circumstances and Increase adequate support systems for patient/family  Progress towards Goals: Ongoing  Interventions: Interventions utilized:  Motivational Interviewing, Psychoeducation and/or Health Education, Link to [employer], and Supportive Reflection Standardized Assessments completed: Not Needed  Patient and/or Family Response: Patient agrees with treatment plan.   Clinical Assessment/Diagnosis  Adjustment disorder, unspecified type   Patient may benefit from psychoeducation and brief therapeutic interventions regarding coping with adjustment .  Plan: Follow up with behavioral health clinician on : Call Lakara Weiland at [phone], as needed. Behavioral recommendations:  -Continue taking prenatal vitamin as recommended until postpartum medical check -Continue prioritizing healthy self-care (regular meals, adequate rest as able;  allowing practical help from supportive friends and family) until at least postpartum medical appointment -Consider new mom support group as needed at either [email or web address].net or [email or web address]   Referral(s): Integrated Art Gallery Manager (In Clinic) and [employer]:  new parent support  I discussed the assessment and treatment plan with the patient and/or parent/guardian. They were provided an opportunity to ask questions and all were answered. They agreed with the plan and demonstrated an understanding of the instructions.   They were advised to call back or seek an in[place] evaluation if the symptoms worsen or if the condition fails to improve as anticipated.  Warren BROCKS Mering, LCSW     10/27/2023   10:33 AM 07/02/2018    5:21 PM  Depression screen PHQ 2/9  Decreased Interest 0 3  Down, Depressed, Hopeless 0 0  PHQ - 2 Score 0 3  Altered sleeping 0 3  Tired, decreased energy 0 3  Change in appetite 0 3  Feeling bad or failure about yourself  0 1  Trouble concentrating 0 0  Moving  slowly or fidgety/restless 0 0  Suicidal thoughts 0  0  PHQ-9 Score 0  13      Data saved with a previous flowsheet row definition      10/27/2023   10:35 AM 07/02/2018    5:22 PM  GAD 7 : Generalized Anxiety Score  Nervous, Anxious, on Edge 0  3   Control/stop worrying 0  3   Worry too much - different things 0  3   Trouble relaxing 0  3   Restless 0  3   Easily annoyed or irritable 0  3   Afraid - awful might happen 0  1   Total GAD 7 Score 0 19     Data saved with a previous flowsheet row definition       05/21/2024   10:40 AM 04/21/2024   11:59 PM 04/21/2024    9:21 PM 04/20/2024    8:52 PM  Edinburgh Postnatal Depression Scale Screening Tool  I have been able to laugh and see the funny side of things. 0 0 -- --  I have looked forward with enjoyment to things. 0 0    I have blamed myself unnecessarily when things went wrong. 0 0    I have been anxious or worried for no good reason. 0 0    I have felt scared or panicky for no good reason. 0 0    Things have been getting on top of me. 2 0    I have been so unhappy that I have had difficulty sleeping. 0 0    I have felt sad or miserable. 0 0    I have been so unhappy that I have been crying. 0 0    The thought of harming myself has occurred to me. 0 0    Edinburgh Postnatal Depression Scale Total 2 0         "

## 2024-04-30 NOTE — Progress Notes (Signed)
 Incision and BP Check Visit  [name] is here for incision check following repeat c-section on 04/20/24.   Assessment:  Provena wound vac removed.  Incision appear to be healing well, dry, intact.  Assessed by Dr. Cresenzo.  She reports still in moderate pain even with ibuprofen , gabapentin , and tylenol .  Provider to send pain medication to pt pharmacy.  She also reports vaginal irritation, she believes it's IUD string related.  This RN chaperoned while Dr. Ilean trimmed IUD strings.  Pt tolerated well.  Education: Reviewed good wound care and s/s of infection with patient.  Blood pressure 146/90, however patient is in pain and did not take her Nifedipine  this morning. Patient will follow up nurse visit in 1 week on 05/07/24 at 09:40 for a blood pressure check pr Dr. Cresenzo.  Waddell LITTIE Burows, RN 04/30/2024  11:03 AM

## 2024-05-03 ENCOUNTER — Ambulatory Visit: Payer: Self-pay

## 2024-05-04 ENCOUNTER — Encounter: Admitting: Obstetrics and Gynecology

## 2024-05-06 ENCOUNTER — Telehealth: Payer: Self-pay | Admitting: *Deleted

## 2024-05-06 NOTE — Telephone Encounter (Signed)
 Received message from [facility] from [facility]. She stated that she had home visit with pt yesterday and pt was doing well. BP taken twice and was 128/78. Pt denies having pre-e sx. She would like to cancel her BP check appt on 1/9. Per Dr. Lola, appt on 1/9 for BP check can be cancelled provided pt is taking Nifedipine  daily. She should stop taking Nifedipine  3 days prior to [facility] appt on 1/23. I called pt and informed her of all recommendations from Dr. Lola. Pt stated that her IUD fell out. I advised that this will be addressed @ her appt on 1/23 and she should refrain from sexual intercourse. Pt voiced understanding of all information and instructions. She will keep appt on 1/23 as scheduled.

## 2024-05-07 ENCOUNTER — Ambulatory Visit

## 2024-05-10 ENCOUNTER — Encounter: Admitting: Obstetrics & Gynecology

## 2024-05-11 ENCOUNTER — Ambulatory Visit: Payer: Self-pay | Admitting: Clinical

## 2024-05-11 DIAGNOSIS — F4320 Adjustment disorder, unspecified: Secondary | ICD-10-CM

## 2024-05-11 NOTE — Patient Instructions (Signed)
 Center for [facility] at [facility] for Women [address] [place], [place] [address] [phone] (main office) [phone] [facility] office)  New Parent Support Groups [email or web address].net [email or web address]   [facility]  [address], [place], [place] [address] [phone] or [phone] [facility] 24/7 FOR ANYONE [address], [place], [place]  [phone] Fax: [phone] [email or web address] *Interpreters available *Accepts all insurance and uninsured for Urgent Care needs *Accepts Medicaid and uninsured for outpatient treatment (below)    ONLY FOR [facility]  Below:   Outpatient New Patient Assessment/Therapy Walk-ins:        Monday -Thursday 8am until slots are full.        Every Friday 1pm-4pm  (first come, first served)                   New Patient Psychiatry/Medication Management        Monday-Friday 8am-11am (first come, first served)              For all walk-ins we ask that you arrive by 7:15am, because patients will be seen in the order of arrival.

## 2024-05-17 ENCOUNTER — Ambulatory Visit: Payer: Self-pay | Admitting: Family Medicine

## 2024-05-18 ENCOUNTER — Other Ambulatory Visit: Payer: Self-pay

## 2024-05-21 ENCOUNTER — Other Ambulatory Visit: Payer: Self-pay

## 2024-05-21 ENCOUNTER — Other Ambulatory Visit (HOSPITAL_COMMUNITY)
Admission: RE | Admit: 2024-05-21 | Discharge: 2024-05-21 | Disposition: A | Source: Ambulatory Visit | Attending: Family Medicine | Admitting: Family Medicine

## 2024-05-21 ENCOUNTER — Ambulatory Visit: Admitting: Family Medicine

## 2024-05-21 ENCOUNTER — Encounter: Payer: Self-pay | Admitting: Family Medicine

## 2024-05-21 DIAGNOSIS — Z30011 Encounter for initial prescription of contraceptive pills: Secondary | ICD-10-CM | POA: Diagnosis not present

## 2024-05-21 DIAGNOSIS — Z975 Presence of (intrauterine) contraceptive device: Secondary | ICD-10-CM | POA: Diagnosis not present

## 2024-05-21 DIAGNOSIS — Z98891 History of uterine scar from previous surgery: Secondary | ICD-10-CM | POA: Diagnosis not present

## 2024-05-21 DIAGNOSIS — Z8632 Personal history of gestational diabetes: Secondary | ICD-10-CM

## 2024-05-21 DIAGNOSIS — Z124 Encounter for screening for malignant neoplasm of cervix: Secondary | ICD-10-CM

## 2024-05-21 DIAGNOSIS — I10 Essential (primary) hypertension: Secondary | ICD-10-CM | POA: Diagnosis not present

## 2024-05-21 LAB — POCT PREGNANCY, URINE: Preg Test, Ur: NEGATIVE

## 2024-05-21 MED ORDER — SLYND 4 MG PO TABS: 1.0000 | ORAL_TABLET | Freq: Every day | ORAL | 11 refills | Status: AC

## 2024-05-21 NOTE — Progress Notes (Signed)
 "   Post Partum Visit Note  Teresa [name] is a 34 y.o. G42P3003 female who presents for a postpartum visit. She is 4 weeks postpartum following a repeat cesarean section.  I have fully reviewed the prenatal and intrapartum course. The delivery was at 37.1 gestational weeks.  Anesthesia: spinal. Postpartum course has been good. Baby is doing well. Baby is feeding by bottle - Similac 360. Bleeding no bleeding. Bowel function is normal. Bladder function is normal. Patient is not sexually active. Contraception method is IUD. Postpartum depression screening: negative.   The pregnancy intention screening data noted above was reviewed. Potential methods of contraception were discussed. The patient elected to proceed with No data recorded.   Edinburgh Postnatal Depression Scale - 05/21/24 1040       Edinburgh Postnatal Depression Scale:  In the Past 7 Days   I have been able to laugh and see the funny side of things. 0    I have looked forward with enjoyment to things. 0    I have blamed myself unnecessarily when things went wrong. 0    I have been anxious or worried for no good reason. 0    I have felt scared or panicky for no good reason. 0    Things have been getting on top of me. 2    I have been so unhappy that I have had difficulty sleeping. 0    I have felt sad or miserable. 0    I have been so unhappy that I have been crying. 0    The thought of harming myself has occurred to me. 0    Edinburgh Postnatal Depression Scale Total 2          Health Maintenance Due  Topic Date Due   FOOT EXAM  Never done   OPHTHALMOLOGY EXAM  Never done   Diabetic kidney evaluation - Urine ACR  Never done   Pneumococcal Vaccine (2 of 2 - PCV) 11/28/2012   Influenza Vaccine  11/28/2023   COVID-19 Vaccine (2 - 2025-26 season) 12/29/2023   HEMOGLOBIN A1C  04/27/2024    The following portions of the patient's history were reviewed and updated as appropriate: allergies, current medications, past  family history, past medical history, past social history, past surgical history, and problem list.  Review of Systems Pertinent positives and negative per HPI, all others reviewed and negative  Objective:  BP 127/87   Pulse (!) 101   Wt (!) 330 lb (149.7 kg)   LMP 07/17/2023 (Approximate)   Breastfeeding No   BMI 60.36 kg/m    General:  alert, cooperative, and appears stated age   Breasts:  not indicated  Lungs: Comfortalbe on room air  Wound well approximated incision  GU exam:  normal        Assessment:   Postpartum exam  IUD (intrauterine device) in place  History of gestational diabetes  Essential hypertension  History of cesarean delivery   Normal postpartum exam.   Plan:   Essential components of care per ACOG recommendations:  1.  Mood and well being: Patient with negative depression screening today. Reviewed local resources for support.  - Patient tobacco use? No.   - hx of drug use? No.    2. Infant care and feeding:  -Patient currently breastmilk feeding? No.  -Social determinants of health (SDOH) reviewed in EPIC. No concerns  3. Sexuality, contraception and birth spacing - Patient does not want a pregnancy in the next year.  Desired family  size is 3 children.  - Reviewed reproductive life planning. Reviewed contraceptive methods based on pt preferences and effectiveness.  Patient desired IUD today.  Had one placed at time of cesarean but it fell out in the shower. Initially attempted placement of a new one but patient asked for procedure to be stopped when I tried to sound uterus, will re-attempt at later date. - Discussed birth spacing of 18 months  4. Sleep and fatigue -Encouraged family/partner/community support of 4 hrs of uninterrupted sleep to help with mood and fatigue  5. Physical Recovery  - Discussed patients delivery and complications. She describes her labor as n/a - Patient had a C-section repeat; no problems after deliver.  -  Patient has urinary incontinence? No. - Patient is safe to resume physical and sexual activity  6.  Health Maintenance - HM due items addressed Yes - Last pap smear  Diagnosis  Date Value Ref Range Status  11/21/2017   Final   NEGATIVE FOR INTRAEPITHELIAL LESIONS OR MALIGNANCY. BENIGN REACTIVE/REPARATIVE CHANGES.   Pap smear done at today's visit.  -Breast Cancer screening indicated? No.   7. Chronic Disease/Pregnancy Condition follow up:   1. Postpartum exam See above  2. IUD (intrauterine device) in place Sanders out, attempted replacement but unable to tolerate, would like cervical block with next attempt Given Slynd  for bridge until then  3. History of gestational diabetes Not fasting, will return for 2 hr GTT next week  4. Essential hypertension Normotensive today, no meds  5. History of cesarean delivery    - PCP follow up  Donnice CHRISTELLA Carolus, MD Center for [facility], [facility]  "

## 2024-05-25 ENCOUNTER — Other Ambulatory Visit

## 2024-05-25 ENCOUNTER — Other Ambulatory Visit: Payer: Self-pay

## 2024-05-26 ENCOUNTER — Other Ambulatory Visit

## 2024-05-26 LAB — CYTOLOGY - PAP
Comment: NEGATIVE
Diagnosis: NEGATIVE
Diagnosis: REACTIVE
High risk HPV: NEGATIVE

## 2024-05-27 ENCOUNTER — Ambulatory Visit: Payer: Self-pay | Admitting: Family Medicine

## 2024-06-10 ENCOUNTER — Other Ambulatory Visit

## 2024-06-10 ENCOUNTER — Ambulatory Visit: Payer: Self-pay | Admitting: Obstetrics and Gynecology
